# Patient Record
Sex: Male | Born: 1945
Health system: Southern US, Community
[De-identification: ages and names within clinical notes are randomized; demographics above are authoritative.]

## PROBLEM LIST (undated history)

## (undated) DIAGNOSIS — K5792 Diverticulitis of intestine, part unspecified, without perforation or abscess without bleeding: Secondary | ICD-10-CM

## (undated) DIAGNOSIS — D649 Anemia, unspecified: Secondary | ICD-10-CM

## (undated) DIAGNOSIS — F419 Anxiety disorder, unspecified: Secondary | ICD-10-CM

## (undated) DIAGNOSIS — J45909 Unspecified asthma, uncomplicated: Secondary | ICD-10-CM

## (undated) DIAGNOSIS — C61 Malignant neoplasm of prostate: Secondary | ICD-10-CM

## (undated) DIAGNOSIS — T7840XA Allergy, unspecified, initial encounter: Secondary | ICD-10-CM

## (undated) DIAGNOSIS — E785 Hyperlipidemia, unspecified: Secondary | ICD-10-CM

## (undated) DIAGNOSIS — R972 Elevated prostate specific antigen [PSA]: Secondary | ICD-10-CM

## (undated) DIAGNOSIS — K219 Gastro-esophageal reflux disease without esophagitis: Secondary | ICD-10-CM

## (undated) HISTORY — PX: OTHER SURGICAL HISTORY: SHX169

## (undated) HISTORY — DX: Anemia, unspecified: D64.9

## (undated) HISTORY — DX: Gastro-esophageal reflux disease without esophagitis: K21.9

## (undated) HISTORY — PX: VASECTOMY: SHX75

## (undated) HISTORY — DX: Hyperlipidemia, unspecified: E78.5

## (undated) HISTORY — PX: TRANSURETHRAL RESECTION OF PROSTATE: SHX73

## (undated) HISTORY — DX: Allergy, unspecified, initial encounter: T78.40XA

## (undated) HISTORY — DX: Anxiety disorder, unspecified: F41.9

## (undated) HISTORY — DX: Malignant neoplasm of prostate: C61

## (undated) HISTORY — DX: Unspecified asthma, uncomplicated: J45.909

## (undated) HISTORY — DX: Diverticulitis of intestine, part unspecified, without perforation or abscess without bleeding: K57.92

## (undated) HISTORY — DX: Elevated prostate specific antigen (PSA): R97.20

---

## 1996-04-22 DIAGNOSIS — J45909 Unspecified asthma, uncomplicated: Secondary | ICD-10-CM

## 1996-04-22 HISTORY — DX: Unspecified asthma, uncomplicated: J45.909

## 1997-04-22 HISTORY — PX: HERNIA REPAIR: SHX51

## 1997-09-02 ENCOUNTER — Ambulatory Visit (HOSPITAL_BASED_OUTPATIENT_CLINIC_OR_DEPARTMENT_OTHER): Admission: RE | Admit: 1997-09-02 | Discharge: 1997-09-02 | Payer: Self-pay | Admitting: General Surgery

## 1998-01-13 ENCOUNTER — Ambulatory Visit (HOSPITAL_COMMUNITY): Admission: RE | Admit: 1998-01-13 | Discharge: 1998-01-13 | Payer: Self-pay | Admitting: Gastroenterology

## 1998-09-21 ENCOUNTER — Emergency Department (HOSPITAL_COMMUNITY): Admission: EM | Admit: 1998-09-21 | Discharge: 1998-09-21 | Payer: Self-pay | Admitting: Emergency Medicine

## 1998-09-21 ENCOUNTER — Encounter: Payer: Self-pay | Admitting: Emergency Medicine

## 2001-05-16 ENCOUNTER — Ambulatory Visit (HOSPITAL_COMMUNITY): Admission: RE | Admit: 2001-05-16 | Discharge: 2001-05-16 | Payer: Self-pay | Admitting: Family Medicine

## 2001-05-16 ENCOUNTER — Encounter: Payer: Self-pay | Admitting: Family Medicine

## 2002-08-19 ENCOUNTER — Encounter: Payer: Self-pay | Admitting: Emergency Medicine

## 2002-08-19 ENCOUNTER — Emergency Department (HOSPITAL_COMMUNITY): Admission: EM | Admit: 2002-08-19 | Discharge: 2002-08-20 | Payer: Self-pay | Admitting: Emergency Medicine

## 2003-01-06 ENCOUNTER — Encounter: Payer: Self-pay | Admitting: Emergency Medicine

## 2003-01-06 ENCOUNTER — Emergency Department (HOSPITAL_COMMUNITY): Admission: EM | Admit: 2003-01-06 | Discharge: 2003-01-06 | Payer: Self-pay | Admitting: Emergency Medicine

## 2004-06-28 ENCOUNTER — Ambulatory Visit (HOSPITAL_COMMUNITY): Admission: RE | Admit: 2004-06-28 | Discharge: 2004-06-28 | Payer: Self-pay | Admitting: Family Medicine

## 2004-08-30 ENCOUNTER — Ambulatory Visit: Payer: Self-pay | Admitting: Internal Medicine

## 2007-03-26 ENCOUNTER — Ambulatory Visit: Payer: Self-pay | Admitting: Internal Medicine

## 2007-05-07 ENCOUNTER — Ambulatory Visit: Payer: Self-pay | Admitting: Internal Medicine

## 2007-05-07 ENCOUNTER — Encounter: Payer: Self-pay | Admitting: Internal Medicine

## 2010-10-12 ENCOUNTER — Ambulatory Visit (HOSPITAL_COMMUNITY)
Admission: RE | Admit: 2010-10-12 | Discharge: 2010-10-12 | Disposition: A | Discharge status: Home / Self Care | Payer: BC Managed Care – PPO | Source: Ambulatory Visit | Attending: Family Medicine | Admitting: Family Medicine

## 2010-10-12 ENCOUNTER — Other Ambulatory Visit: Payer: Self-pay | Admitting: Family Medicine

## 2010-10-12 DIAGNOSIS — R52 Pain, unspecified: Secondary | ICD-10-CM

## 2010-10-12 DIAGNOSIS — R937 Abnormal findings on diagnostic imaging of other parts of musculoskeletal system: Secondary | ICD-10-CM | POA: Insufficient documentation

## 2010-10-12 DIAGNOSIS — M25449 Effusion, unspecified hand: Secondary | ICD-10-CM | POA: Insufficient documentation

## 2010-10-12 DIAGNOSIS — M25549 Pain in joints of unspecified hand: Secondary | ICD-10-CM | POA: Insufficient documentation

## 2012-04-08 ENCOUNTER — Other Ambulatory Visit: Payer: Self-pay | Admitting: Family Medicine

## 2012-12-22 ENCOUNTER — Other Ambulatory Visit: Payer: Self-pay | Admitting: Family Medicine

## 2012-12-22 NOTE — Telephone Encounter (Signed)
1 refill needs followup office

## 2013-03-23 ENCOUNTER — Ambulatory Visit: Payer: Self-pay | Admitting: Radiation Oncology

## 2013-03-24 ENCOUNTER — Ambulatory Visit (INDEPENDENT_AMBULATORY_CARE_PROVIDER_SITE_OTHER): Payer: Medicare Other | Admitting: Family Medicine

## 2013-03-24 ENCOUNTER — Telehealth: Payer: Self-pay | Admitting: Family Medicine

## 2013-03-24 ENCOUNTER — Encounter: Payer: Self-pay | Admitting: Family Medicine

## 2013-03-24 ENCOUNTER — Other Ambulatory Visit: Payer: Self-pay | Admitting: Family Medicine

## 2013-03-24 ENCOUNTER — Other Ambulatory Visit: Payer: Self-pay | Admitting: *Deleted

## 2013-03-24 VITALS — BP 138/86 | Temp 97.8°F | Ht 68.0 in | Wt 189.8 lb

## 2013-03-24 DIAGNOSIS — J329 Chronic sinusitis, unspecified: Secondary | ICD-10-CM

## 2013-03-24 MED ORDER — ALPRAZOLAM 1 MG PO TABS: 1.0000 mg | ORAL_TABLET | Freq: Every evening | ORAL | Status: DC | PRN

## 2013-03-24 MED ORDER — AMOXICILLIN-POT CLAVULANATE 875-125 MG PO TABS: 1.0000 | ORAL_TABLET | Freq: Two times a day (BID) | ORAL | Status: AC

## 2013-03-24 NOTE — Telephone Encounter (Signed)
Ok plus two ref 

## 2013-03-24 NOTE — Telephone Encounter (Signed)
Not seen since Epic. 

## 2013-03-24 NOTE — Patient Instructions (Signed)
Purchase some debrox drops and every night for a week put a dropper of the medicine in your ears

## 2013-03-24 NOTE — Telephone Encounter (Signed)
Patient needs new prescription for alprazolam 1mg  called into [facility]

## 2013-03-24 NOTE — Telephone Encounter (Signed)
rx faxed  Pt notified

## 2013-03-24 NOTE — Progress Notes (Signed)
   Subjective:    Patient ID: Thomas [name], male    DOB: Oct 22, 1945, 67 y.o.   MRN: [id number]  Sinusitis This is a new problem. The current episode started 1 to 4 weeks ago. There has been no fever. Associated symptoms include congestion, ear pain, headaches and sinus pressure. Past treatments include saline sprays. The treatment provided mild relief.   Going on the past month Frontal headache  Loss of hearing left ear  Mod pain  Used ear wax cleaner  Used occasioal cold med, using spray for the nose    Review of Systems  HENT: Positive for congestion, ear pain and sinus pressure.   Neurological: Positive for headaches.   no chest pain no shortness breath ROS otherwise negative     Objective:   Physical Exam  Alert hydration good. Frontal tenderness. Pharynx slight erythema neck supple left ear packed with wax lungs clear. Heart regular in rhythm.      Assessment & Plan:  Impression rhinosinusitis #2 cerumen impaction plan Augmentin twice a day 10 days. Rationale discussed. Debrox drops to ear daily for the next week. Return for ear wax removal. WSL

## 2013-03-25 NOTE — Telephone Encounter (Signed)
Okay x3 if not already handled

## 2013-03-31 ENCOUNTER — Ambulatory Visit (INDEPENDENT_AMBULATORY_CARE_PROVIDER_SITE_OTHER): Payer: Medicare Other | Admitting: *Deleted

## 2013-03-31 ENCOUNTER — Other Ambulatory Visit: Payer: Self-pay | Admitting: Family Medicine

## 2013-03-31 ENCOUNTER — Other Ambulatory Visit: Payer: Self-pay | Admitting: *Deleted

## 2013-03-31 DIAGNOSIS — H6122 Impacted cerumen, left ear: Secondary | ICD-10-CM

## 2013-03-31 MED ORDER — ALPRAZOLAM 1 MG PO TABS: 1.0000 mg | ORAL_TABLET | Freq: Every evening | ORAL | Status: DC | PRN

## 2013-03-31 NOTE — Progress Notes (Signed)
   Subjective:    Patient ID: Thomas [name], male    DOB: Nov 30, 1945, 67 y.o.   [name]  HPI Ear irrigation on left ear. Patient tolerated well. Patient still has impacted ear wax on ear drum, Dr. Lorin Picket looked in his ears as well. We are going to refer him to an ENT.     Review of Systems     Objective:   Physical Exam        Assessment & Plan:

## 2013-04-07 ENCOUNTER — Ambulatory Visit: Payer: Self-pay | Admitting: Radiation Oncology

## 2013-04-22 ENCOUNTER — Ambulatory Visit: Payer: Self-pay | Admitting: Radiation Oncology

## 2013-05-13 ENCOUNTER — Other Ambulatory Visit: Payer: Self-pay | Admitting: *Deleted

## 2013-05-13 MED ORDER — ALPRAZOLAM 1 MG PO TABS: 1.0000 mg | ORAL_TABLET | Freq: Every evening | ORAL | Status: DC | PRN

## 2013-05-24 ENCOUNTER — Ambulatory Visit: Payer: Self-pay | Admitting: Radiation Oncology

## 2013-06-14 ENCOUNTER — Ambulatory Visit: Payer: Self-pay | Admitting: Urology

## 2013-06-14 LAB — CBC WITH DIFFERENTIAL/PLATELET
Basophil #: 0.1 10*3/uL (ref 0.0–0.1)
Basophil %: 1.4 %
EOS PCT: 7.7 %
Eosinophil #: 0.4 10*3/uL (ref 0.0–0.7)
HCT: 31.7 % — AB (ref 40.0–52.0)
HGB: 9.8 g/dL — ABNORMAL LOW (ref 13.0–18.0)
LYMPHS ABS: 1.5 10*3/uL (ref 1.0–3.6)
Lymphocyte %: 29.1 %
MCH: 22 pg — AB (ref 26.0–34.0)
MCHC: 30.8 g/dL — ABNORMAL LOW (ref 32.0–36.0)
MCV: 71 fL — ABNORMAL LOW (ref 80–100)
Monocyte #: 0.6 x10 3/mm (ref 0.2–1.0)
Monocyte %: 10.6 %
NEUTROS PCT: 51.2 %
Neutrophil #: 2.7 10*3/uL (ref 1.4–6.5)
PLATELETS: 282 10*3/uL (ref 150–440)
RBC: 4.45 10*6/uL (ref 4.40–5.90)
RDW: 17 % — ABNORMAL HIGH (ref 11.5–14.5)
WBC: 5.2 10*3/uL (ref 3.8–10.6)

## 2013-06-16 ENCOUNTER — Telehealth: Payer: Self-pay | Admitting: Family Medicine

## 2013-06-16 NOTE — Telephone Encounter (Signed)
Surgical clearence forms, see inside left side paper chart

## 2013-06-18 NOTE — Telephone Encounter (Signed)
I discussed the case with his wife. I believe this patient can undergo radiation seed implants. A letter was constructed and sent to preop surgical center at [facility]. It was recommended to the wife that the patient followup within 30 days to discuss the anemia and also cognitive function. She stated she will see to it that it occurs.

## 2013-06-20 ENCOUNTER — Ambulatory Visit: Payer: Self-pay | Admitting: Radiation Oncology

## 2013-06-21 ENCOUNTER — Ambulatory Visit: Payer: Self-pay | Admitting: Urology

## 2013-06-22 ENCOUNTER — Ambulatory Visit: Payer: Medicare HMO | Admitting: Family Medicine

## 2013-06-26 ENCOUNTER — Encounter: Payer: Self-pay | Admitting: *Deleted

## 2013-06-30 ENCOUNTER — Ambulatory Visit (INDEPENDENT_AMBULATORY_CARE_PROVIDER_SITE_OTHER): Payer: Medicare HMO | Admitting: Family Medicine

## 2013-06-30 ENCOUNTER — Encounter: Payer: Self-pay | Admitting: Family Medicine

## 2013-06-30 VITALS — BP 120/70 | Ht 68.0 in | Wt 192.1 lb

## 2013-06-30 DIAGNOSIS — D509 Iron deficiency anemia, unspecified: Secondary | ICD-10-CM

## 2013-06-30 DIAGNOSIS — R109 Unspecified abdominal pain: Secondary | ICD-10-CM

## 2013-06-30 DIAGNOSIS — R6889 Other general symptoms and signs: Secondary | ICD-10-CM

## 2013-06-30 LAB — HEPATIC FUNCTION PANEL
ALBUMIN: 3.8 g/dL (ref 3.5–5.2)
ALT: 35 U/L (ref 0–53)
AST: 29 U/L (ref 0–37)
Alkaline Phosphatase: 79 U/L (ref 39–117)
BILIRUBIN TOTAL: 0.2 mg/dL (ref 0.2–1.2)
Bilirubin, Direct: <0.1 mg/dL (ref 0.0–0.3)
TOTAL PROTEIN: 6.1 g/dL (ref 6.0–8.3)

## 2013-06-30 LAB — BASIC METABOLIC PANEL
BUN: 11 mg/dL (ref 6–23)
CALCIUM: 8.9 mg/dL (ref 8.4–10.5)
CHLORIDE: 105 meq/L (ref 96–112)
CO2: 27 mEq/L (ref 19–32)
CREATININE: 0.83 mg/dL (ref 0.50–1.35)
Glucose, Bld: 87 mg/dL (ref 70–99)
Potassium: 4.5 mEq/L (ref 3.5–5.3)
Sodium: 140 mEq/L (ref 135–145)

## 2013-06-30 LAB — IRON AND TIBC
%SAT: 4 % — ABNORMAL LOW (ref 20–55)
IRON: 17 ug/dL — AB (ref 42–165)
TIBC: 473 ug/dL — ABNORMAL HIGH (ref 215–435)
UIBC: 456 ug/dL — AB (ref 125–400)

## 2013-06-30 LAB — LIPASE: LIPASE: 26 U/L (ref 0–75)

## 2013-06-30 NOTE — Progress Notes (Signed)
Subjective:    Patient ID: Thomas [name], male    DOB: 1945/10/02, 68 y.o.   MRN: [id number]  Abdominal Pain This is a new problem. The current episode started 1 to 4 weeks ago. The onset quality is sudden. The problem occurs intermittently. The problem has been unchanged. The pain is located in the generalized abdominal region. The pain is moderate. The abdominal pain does not radiate. Pertinent negatives include no anorexia, diarrhea, headaches or nausea. Nothing aggravates the pain. The pain is relieved by nothing. He has tried nothing for the symptoms. The treatment provided no relief.  Patient is also here for a follow up visit after his prostate seeding procedure. Patient states he also has a low blood count.   comes and goes, doesn't wake with this at night.  Had anemia in preop-long discussion held regarding anemia what it could mean. Denies rectal bleeding Long discussion held regarding patient's forgetfulness. Wife raises it is more of a concern that the patient does.   Review of Systems  Constitutional: Negative for activity change, appetite change and fatigue.  HENT: Negative for congestion.   Respiratory: Negative for chest tightness and shortness of breath.   Cardiovascular: Negative for chest pain.  Gastrointestinal: Positive for abdominal pain and blood in stool. Negative for nausea, diarrhea and anorexia.  Endocrine: Negative for polydipsia and polyphagia.  Genitourinary: Negative for flank pain and difficulty urinating.  Neurological: Negative for dizziness, weakness and headaches.  Psychiatric/Behavioral: Negative for confusion.       Objective:   Physical Exam  Constitutional: He appears well-developed and well-nourished.  HENT:  Head: Normocephalic and atraumatic.  Right Ear: External ear normal.  Left Ear: External ear normal.  Nose: Nose normal.  Mouth/Throat: Oropharynx is clear and moist.  Eyes: EOM are normal.  Neck: Neck supple. No thyromegaly  present.  Cardiovascular: Normal rate, regular rhythm and normal heart sounds.   No murmur heard. Pulmonary/Chest: Effort normal and breath sounds normal. No respiratory distress. He has no wheezes.  Abdominal: Soft. Bowel sounds are normal. He exhibits no distension and no mass. There is no tenderness.  Genitourinary: Penis normal.  Musculoskeletal: Normal range of motion. He exhibits no edema.  Lymphadenopathy:    He has no cervical adenopathy.  Neurological: He is alert. He exhibits normal muscle tone.  Skin: Skin is warm and dry. No erythema.  Psychiatric: He has a normal mood and affect. His behavior is normal. Judgment normal.    MMSE completed.      Assessment & Plan:  Anemia- will need labs possibility of deficient anemia. I believe that this patient will probably end up needing to have a scope test. His last one in 2009. Is very important for him to go ahead with getting these lab tests done this was expressed to the patient he was told that there could be numerous problems including colon cancer going on.  abd pain - start PPI, hopefully this will improve him over the course of next 2 weeks. If it does not he will need further testing done. He is going to be seen oncologist regarding his prostate cancer. I wonder if the patient is going to be doing a PET scan. The wife will give me the phone number for the oncologist and we will try to communicate with them regarding this.  Forgetfulness-the wife raises it is concerned that he has a tendency toward forgetfulness and forgetting short-term memory aspects but the patient does 3 for 3 immediate and distracted recall.  He does successful clock drawing. He scores 30 out of 30 with the mini mental status exam so therefore we will continue currently as is.  He should followup again with [place] in several months time

## 2013-07-01 LAB — FERRITIN: Ferritin: 4 ng/mL — ABNORMAL LOW (ref 22–322)

## 2013-07-05 ENCOUNTER — Other Ambulatory Visit: Payer: Self-pay | Admitting: Family Medicine

## 2013-07-05 ENCOUNTER — Telehealth: Payer: Self-pay | Admitting: Family Medicine

## 2013-07-05 DIAGNOSIS — D509 Iron deficiency anemia, unspecified: Secondary | ICD-10-CM

## 2013-07-05 NOTE — Telephone Encounter (Signed)
Significant iron deficiency is noted. This means looking into. I recommend that we set him back up with gastroenterology because I believe they will need to do a scope test to help identify why he is so deficient in iron. He has seen Dr. [name] before in the past. Is he fine with me giving him an appointment with her?

## 2013-07-05 NOTE — Telephone Encounter (Signed)
Referral was made please see previous telephone message. Referral is now in the system. Thank you

## 2013-07-05 NOTE — Telephone Encounter (Signed)
Calling to get results to patients blood work

## 2013-07-05 NOTE — Telephone Encounter (Signed)
Results discussed with patient. Patient wants to see Dr. [name]. Patient prefers Monday March 31 after 12 or Tuesday March 31 all day or April 1 all day

## 2013-07-19 ENCOUNTER — Encounter: Payer: Self-pay | Admitting: Gastroenterology

## 2013-07-19 ENCOUNTER — Ambulatory Visit (INDEPENDENT_AMBULATORY_CARE_PROVIDER_SITE_OTHER): Payer: Medicare HMO | Admitting: Gastroenterology

## 2013-07-19 VITALS — BP 118/68 | HR 64 | Ht 68.0 in | Wt 189.0 lb

## 2013-07-19 DIAGNOSIS — E611 Iron deficiency: Secondary | ICD-10-CM | POA: Insufficient documentation

## 2013-07-19 DIAGNOSIS — D509 Iron deficiency anemia, unspecified: Secondary | ICD-10-CM

## 2013-07-19 DIAGNOSIS — K625 Hemorrhage of anus and rectum: Secondary | ICD-10-CM | POA: Insufficient documentation

## 2013-07-19 DIAGNOSIS — R1012 Left upper quadrant pain: Secondary | ICD-10-CM | POA: Insufficient documentation

## 2013-07-19 DIAGNOSIS — D649 Anemia, unspecified: Secondary | ICD-10-CM

## 2013-07-19 MED ORDER — MOVIPREP 100 G PO SOLR: 1.0000 | Freq: Once | ORAL | Status: DC

## 2013-07-19 MED ORDER — FERROUS SULFATE 325 (65 FE) MG PO TABS: 325.0000 mg | ORAL_TABLET | Freq: Every day | ORAL | Status: DC

## 2013-07-19 NOTE — Progress Notes (Signed)
07/19/2013 Thomas [name] [id number] 1945/10/13   HISTORY OF PRESENT ILLNESS:  This is a pleasant 68 year old male who presents to our office today at the request of his PCP, Dr. [name].  He was sent here today for evaluation regarding upper abdominal pain and iron deficiency.  The patient states that he really started noticing this pain in his left epigastrium/LUQ approximately 3 weeks ago.  He says that he thinks that it was present very mildly before that as well, but definitely worsened about 3 weeks ago.  He says that it was going through to his back.  He was previously taking NSAID's, but when he read about ulcers he stopped taking them.  He is taking Nexium 40 mg daily and has been taking that for several years, but had tried to decrease the use before this pain began; since the pain has bee present he has been taking it every day again.  He says that he got some OTC gas-x, which may have helped some.  The pain is improved now, but is still present.  Lipase was normal.  He was found to have low iron levels with ferritin of 4, iron level of 17, and % saturation at 4.  No CBC was drawn, however.  He was not placed on iron supplements by his PCP, however.    His last colonoscopy was in 04/2007 by Dr. [name], however, I cannot access the report at this time.  He had a "polyp" removed but pathology did not show any changes, only colorectal mucosa.  Never had EGD.  He says that he had been having some rectal bleeding (bright red blood) for a while, but his PCP thought it was likely from a hemorrhoid.  He denies any weight loss.   Past Medical History  Diagnosis Date  . Diverticulitis   . Asthma 1998  . GERD (gastroesophageal reflux disease)   . Hyperlipidemia   . Elevated PSA    Past Surgical History  Procedure Laterality Date  . Vasectomy    . Hernia repair  1999    reports that he has never smoked. He does not have any smokeless tobacco history on file. His alcohol and drug histories  are not on file. family history includes Diabetes in his brother; Heart disease in his father; Hypertension in his brother. Allergies  Allergen Reactions  . Ciprofloxacin       Outpatient Encounter Prescriptions as of 07/19/2013  Medication Sig  . ALPRAZolam (XANAX) 1 MG tablet Take 1 tablet (1 mg total) by mouth at bedtime as needed for anxiety.  . cetirizine (ZYRTEC) 10 MG tablet Take 10 mg by mouth daily.  Marland Kitchen esomeprazole (NEXIUM) 40 MG capsule Take 40 mg by mouth daily at 12 noon.     REVIEW OF SYSTEMS  : All other systems reviewed and negative except where noted in the History of Present Illness.   PHYSICAL EXAM: BP 118/68  Pulse 64  Ht 5\' 8"  (1.727 m)  Wt 189 lb (85.73 kg)  BMI 28.74 kg/m2 General: Well developed white male in no acute distress Head: Normocephalic and atraumatic Eyes:  Sclerae anicteric, conjunctiva pink. Ears: Normal auditory acuity Lungs: Clear throughout to auscultation Heart: Regular rate and rhythm Abdomen: Soft, non-distended.  Normal bowel sounds.  Non-tender. Rectal:  Deferred.  Will be done at the time of colonoscopy.   Musculoskeletal: Symmetrical with no gross deformities  Skin: No lesions on visible extremities Extremities: No edema  Neurological: Alert oriented x 4, grossly non-focal  Psychological:  Alert and cooperative. Normal mood and affect  ASSESSMENT AND PLAN: -Iron deficiency:  Iron levels low, but no Hgb drawn.  Will check CBC.  Will need to start ferrous sulfate 325 mg daily for now.   -LUQ abdominal pain:  ? Ulcer.  Will increase Nexium to 40 mg BID for now (samples given).  Was previously taking NSAID's, but has discontinued those.  -Rectal bleeding:  Thought to be secondary to hemorrhoids.  Last colonoscopy was 6 years ago.  *Will schedule EGD and colonoscopy for further evaluation.  The risks, benefits, and alternatives were discussed with the patient and he consents to proceed.

## 2013-07-19 NOTE — Patient Instructions (Signed)
You have been scheduled for an endoscopy and colonoscopy with propofol. Please follow the written instructions given to you at your visit today. Please pick up your prep at the pharmacy within the next 1-3 days. If you use inhalers (even only as needed), please bring them with you on the day of your procedure. Your physician has requested that you go to www.startemmi.com and enter the access code given to you at your visit today. This web site gives a general overview about your procedure. However, you should still follow specific instructions given to you by our office regarding your preparation for the procedure. Increase your nexium to twice daily samples have been given. Your physician has requested that you go to the basement for the following lab work this week.  CBC We have sent the following medications to your pharmacy for you to pick up at your convenience:  Ferrous Sulfate (iron) CC:  Sallee Lange MD

## 2013-07-19 NOTE — Progress Notes (Signed)
Reviewed and agree.

## 2013-07-21 ENCOUNTER — Ambulatory Visit: Payer: Self-pay | Admitting: Radiation Oncology

## 2013-07-21 ENCOUNTER — Other Ambulatory Visit (INDEPENDENT_AMBULATORY_CARE_PROVIDER_SITE_OTHER): Payer: Commercial Managed Care - HMO

## 2013-07-21 DIAGNOSIS — D649 Anemia, unspecified: Secondary | ICD-10-CM

## 2013-07-21 LAB — CBC WITH DIFFERENTIAL/PLATELET
BASOS PCT: 0.2 % (ref 0.0–3.0)
Basophils Absolute: 0 10*3/uL (ref 0.0–0.1)
EOS PCT: 8.1 % — AB (ref 0.0–5.0)
Eosinophils Absolute: 0.6 10*3/uL (ref 0.0–0.7)
HCT: 29.6 % — ABNORMAL LOW (ref 39.0–52.0)
HEMOGLOBIN: 9.4 g/dL — AB (ref 13.0–17.0)
LYMPHS PCT: 15.7 % (ref 12.0–46.0)
Lymphs Abs: 1.3 10*3/uL (ref 0.7–4.0)
MCHC: 31.8 g/dL (ref 30.0–36.0)
MCV: 69.7 fl — ABNORMAL LOW (ref 78.0–100.0)
MONOS PCT: 10.9 % (ref 3.0–12.0)
Monocytes Absolute: 0.9 10*3/uL (ref 0.1–1.0)
NEUTROS ABS: 5.2 10*3/uL (ref 1.4–7.7)
Neutrophils Relative %: 65.1 % (ref 43.0–77.0)
Platelets: 246 10*3/uL (ref 150.0–400.0)
RBC: 4.24 Mil/uL (ref 4.22–5.81)
RDW: 16.7 % — ABNORMAL HIGH (ref 11.5–14.6)
WBC: 8 10*3/uL (ref 4.5–10.5)

## 2013-08-20 ENCOUNTER — Ambulatory Visit: Payer: Self-pay | Admitting: Radiation Oncology

## 2013-08-25 ENCOUNTER — Encounter: Payer: Self-pay | Admitting: Internal Medicine

## 2013-08-25 ENCOUNTER — Ambulatory Visit (AMBULATORY_SURGERY_CENTER): Payer: Commercial Managed Care - HMO | Admitting: Internal Medicine

## 2013-08-25 VITALS — BP 132/81 | HR 84 | Temp 96.1°F | Resp 21 | Ht 68.0 in | Wt 189.0 lb

## 2013-08-25 DIAGNOSIS — K2990 Gastroduodenitis, unspecified, without bleeding: Secondary | ICD-10-CM

## 2013-08-25 DIAGNOSIS — K625 Hemorrhage of anus and rectum: Secondary | ICD-10-CM

## 2013-08-25 DIAGNOSIS — K2970 Gastritis, unspecified, without bleeding: Secondary | ICD-10-CM

## 2013-08-25 DIAGNOSIS — D509 Iron deficiency anemia, unspecified: Secondary | ICD-10-CM

## 2013-08-25 DIAGNOSIS — D649 Anemia, unspecified: Secondary | ICD-10-CM

## 2013-08-25 DIAGNOSIS — K2940 Chronic atrophic gastritis without bleeding: Secondary | ICD-10-CM

## 2013-08-25 DIAGNOSIS — K209 Esophagitis, unspecified without bleeding: Secondary | ICD-10-CM

## 2013-08-25 DIAGNOSIS — E611 Iron deficiency: Secondary | ICD-10-CM

## 2013-08-25 DIAGNOSIS — K222 Esophageal obstruction: Secondary | ICD-10-CM

## 2013-08-25 MED ORDER — SODIUM CHLORIDE 0.9 % IV SOLN: 500.0000 mL | INTRAVENOUS | Status: DC

## 2013-08-25 NOTE — Op Note (Signed)
[facility]  [address] [place], [id number]   ENDOSCOPY PROCEDURE REPORT  PATIENT: Thomas [name], Thomas [name]  MR#: [id number] BIRTHDATE: [birth year] , 67  yrs. old GENDER: Male ENDOSCOPIST: Lafayette Dragon, MD REFERRED BY:  Sallee Lange, M.D. PROCEDURE DATE:  08/25/2013 PROCEDURE:  EGD w/ biopsy and Savary dilation of esophagus ASA CLASS:     Class II INDICATIONS:  Iron deficiency anemia.   left upper quadrant abdominal pain.  Using anti-inflammatory agents.  Pain has resolved when the Nexium increase to twice a day dose.  Iron saturation 4%.  MEDICATIONS: MAC sedation, administered by CRNA and Propofol (Diprivan) 180 mg IV TOPICAL ANESTHETIC: none  DESCRIPTION OF PROCEDURE: After the risks benefits and alternatives of the procedure were thoroughly explained, informed consent was obtained.  The LB [id number] endoscope was introduced through the mouth and advanced to the second portion of the duodenum. Without limitations.  The instrument was slowly withdrawn as the mucosa was fully examined.      esophagus: Proximal and mid esophageal mucosa appeared normal. There was a mildly eccentric benign appearing esophageal stricture at 352cm from the incisors[  ,it allowed the endoscope to traverse into the stomach but there was small amount of bleeding from the stricture. The diameter of the stricture was about 14 mm. There was a moderate size hiatal hernia distal to the stricture which extended from 32-35 cm from the incisors. There were no Cameron erosions. Biopsies were taken from the stricture to rule out Barrett's esophagus Stomach: Gastric mucosa was normal. There was mild erythema in gastric antrum. Biopsies were taken to rule out H. pylori. Gastric outlet was normal. Retroflexion of the endoscope confirmed the presence of a hiatal hernia Duodenum: Duodenal bulb and descending duodenum was normal Savary dilators so were used to dilate the esophageal  stricture starting with the 15 mm and subsequently a 16 mm dilator there was blood on the last dilator       The scope was then withdrawn from the patient and the procedure completed.  COMPLICATIONS: There were no complications. ENDOSCOPIC IMPRESSION: benign appearing distal esophageal stricture. Status post dilatation to 16 mm Esophagitis. Status post biopsies from GE junction 3 cm nonreducible hiatal hernia Gastric biopsies to rule out H. pylori RECOMMENDATIONS: 1.  Await pathology results 2.  Anti-reflux regimen to be follow 3.  May reduce Nexium to 1 and a Avoid anti-inflammatory agents Proceed with colonoscopy  REPEAT EXAM: for EGD pending biopsy results.  eSigned:  Lafayette Dragon, MD 08/25/2013 3:08 PM   CC:  PATIENT NAME:  Thomas [name], Thomas [name] MR#: [id number]

## 2013-08-25 NOTE — Op Note (Signed)
[facility]  [address]. [place], [address]   COLONOSCOPY PROCEDURE REPORT  PATIENT: [name], Thomas  MR#: [id number] BIRTHDATE: 1945/11/23 , 67  yrs. old GENDER: Male ENDOSCOPIST: Lafayette Dragon, MD REFERRED XL:KGMWN [name], M.D. PROCEDURE DATE:  08/25/2013 PROCEDURE:   Colonoscopy, screening First Screening Colonoscopy - Avg.  risk and is 50 yrs.  old or older - No.  Prior Negative Screening - Now for repeat screening. N/A  History of Adenoma - Now for follow-up colonoscopy & has been > or = to 3 yrs.  N/A  Polyps Removed Today? No.  Recommend repeat exam, <10 yrs? No. ASA CLASS:   Class II INDICATIONS:Iron Deficiency Anemia, Average risk patient for colon cancer, and llast colonoscopy 2009 patient told to have polyps. Report not available, now with iron saturation of 4%. MEDICATIONS: MAC sedation, administered by CRNA and Propofol (Diprivan) 110 mg IV  DESCRIPTION OF PROCEDURE:   After the risks benefits and alternatives of the procedure were thoroughly explained, informed consent was obtained.  A digital rectal exam revealed no abnormalities of the rectum.   The LB [id number]  endoscope was introduced through the anus and advanced to the cecum, which was identified by both the appendix and ileocecal valve. No adverse events experienced.   The quality of the prep was good, using MoviPrep  The instrument was then slowly withdrawn as the colon was fully examined.      COLON FINDINGS: A normal appearing cecum, ileocecal valve, and appendiceal orifice were identified.  The ascending, hepatic flexure, transverse, splenic flexure, descending, sigmoid colon and rectum appeared unremarkable.  No polyps or cancers were seen. Retroflexed views revealed no abnormalities. The time to cecum=4 minutes 40 seconds.  Withdrawal time=6 minutes 37 seconds.  The scope was withdrawn and the procedure completed. COMPLICATIONS: There were no  complications.  ENDOSCOPIC IMPRESSION: Normal colon small internal hemorrhoids,  RECOMMENDATIONS: high fiber diet Recall colonoscopy in 10 years, nothing to explain iron deficiency anemia I think iron deficiency anemia is due to upper GI blood loss from esophageal stricture and gastroesophageal reflux. She was much improved on double dose Nexium. He has a moderate size hiatal hernia and needs to follow strict dietary reflux measures. He will also avoid excessive use of anti-inflammatory agents   eSigned:  Lafayette Dragon, MD 08/25/2013 3:14 PM   cc:   PATIENT NAME:  Thomas, [name] MR#: [id number]

## 2013-08-25 NOTE — Progress Notes (Signed)
Called to room to assist during endoscopic procedure.  Patient ID and intended procedure confirmed with present staff. Received instructions for my participation in the procedure from the performing physician.  

## 2013-08-25 NOTE — Progress Notes (Signed)
A/ox3 pleased with MAC, report to April RN 

## 2013-08-25 NOTE — Patient Instructions (Signed)
YOU HAD AN ENDOSCOPIC PROCEDURE TODAY AT THE [facility]: Refer to the procedure report that was given to you for any specific questions about what was found during the examination.  If the procedure report does not answer your questions, please call your gastroenterologist to clarify.  If you requested that your care partner not be given the details of your procedure findings, then the procedure report has been included in a sealed envelope for you to review at your convenience later.  YOU SHOULD EXPECT: Some feelings of bloating in the abdomen. Passage of more gas than usual.  Walking can help get rid of the air that was put into your GI tract during the procedure and reduce the bloating. If you had a lower endoscopy (such as a colonoscopy or flexible sigmoidoscopy) you may notice spotting of blood in your stool or on the toilet paper. If you underwent a bowel prep for your procedure, then you may not have a normal bowel movement for a few days.  DIET: Your first meal following the procedure should be a light meal and then it is ok to progress to your normal diet.  A half-sandwich or bowl of soup is an example of a good first meal.  Heavy or fried foods are harder to digest and may make you feel nauseous or bloated.  Likewise meals heavy in dairy and vegetables can cause extra gas to form and this can also increase the bloating.  Drink plenty of fluids but you should avoid alcoholic beverages for 24 hours.  ACTIVITY: Your care partner should take you home directly after the procedure.  You should plan to take it easy, moving slowly for the rest of the day.  You can resume normal activity the day after the procedure however you should NOT DRIVE or use heavy machinery for 24 hours (because of the sedation medicines used during the test).    SYMPTOMS TO REPORT IMMEDIATELY: A gastroenterologist can be reached at any hour.  During normal business hours, 8:30 AM to 5:00 PM Monday through Friday,  call [phone].  After hours and on weekends, please call the GI answering service at [phone] who will take a message and have the physician on call contact you.   Following lower endoscopy (colonoscopy or flexible sigmoidoscopy):  Excessive amounts of blood in the stool  Significant tenderness or worsening of abdominal pains  Swelling of the abdomen that is new, acute  Fever of 100F or higher  Following upper endoscopy (EGD)  Vomiting of blood or coffee ground material  New chest pain or pain under the shoulder blades  Painful or persistently difficult swallowing  New shortness of breath  Fever of 100F or higher  Black, tarry-looking stools  FOLLOW UP: If any biopsies were taken you will be contacted by phone or by letter within the next 1-3 weeks.  Call your gastroenterologist if you have not heard about the biopsies in 3 weeks.  Our staff will call the home number listed on your records the next business day following your procedure to check on you and address any questions or concerns that you may have at that time regarding the information given to you following your procedure. This is a courtesy call and so if there is no answer at the home number and we have not heard from you through the emergency physician on call, we will assume that you have returned to your regular daily activities without incident.  SIGNATURES/CONFIDENTIALITY: You and/or your care   partner have signed paperwork which will be entered into your electronic medical record.  These signatures attest to the fact that that the information above on your After Visit Summary has been reviewed and is understood.  Full responsibility of the confidentiality of this discharge information lies with you and/or your care-partner.  Hemorrhoids, stricture, hiatal hernia, gatritis, esophagitis-handouts given  Anti-reflux regimen  Avoid anti-inflammatory medications-advil, motrin, aleve, naproxen  Reduce Nexium to 1  a day.

## 2013-08-26 ENCOUNTER — Telehealth: Payer: Self-pay

## 2013-08-26 NOTE — Telephone Encounter (Signed)
  Follow up Call-  Call back number 08/25/2013  Post procedure Call Back phone  #  cell [phone]  Permission to leave phone message Yes     Patient questions:  Do you have a fever, pain , or abdominal swelling? no Pain Score  0 *  Have you tolerated food without any problems? yes  Have you been able to return to your normal activities? yes  Do you have any questions about your discharge instructions: Diet   no Medications  no Follow up visit  no  Do you have questions or concerns about your Care? no  Actions: * If pain score is 4 or above: No action needed, pain <4.  Pt was not avalable.  I spoke with is wife and she said "he did fine".  I asked her to let him know we called.  To return our call if any questions or concerns. maw

## 2013-08-30 ENCOUNTER — Encounter: Payer: Self-pay | Admitting: Internal Medicine

## 2013-11-12 ENCOUNTER — Ambulatory Visit: Payer: Self-pay | Admitting: Radiation Oncology

## 2013-11-15 ENCOUNTER — Telehealth: Payer: Self-pay | Admitting: Family Medicine

## 2013-11-15 NOTE — Telephone Encounter (Signed)
[employer] referral # [id number], good for 6 visits, expires 05/18/2014 - left this information on Louann's voicemail, pt is existing with Dr. Baruch Gouty

## 2013-11-16 LAB — PSA: PSA: 0.4 ng/mL (ref 0.0–4.0)

## 2013-11-20 ENCOUNTER — Ambulatory Visit: Payer: Self-pay | Admitting: Radiation Oncology

## 2013-11-29 ENCOUNTER — Ambulatory Visit (INDEPENDENT_AMBULATORY_CARE_PROVIDER_SITE_OTHER): Payer: Medicare HMO | Admitting: Internal Medicine

## 2013-11-29 DIAGNOSIS — Z0289 Encounter for other administrative examinations: Secondary | ICD-10-CM

## 2013-12-30 ENCOUNTER — Other Ambulatory Visit: Payer: Self-pay | Admitting: *Deleted

## 2013-12-30 ENCOUNTER — Telehealth: Payer: Self-pay | Admitting: Family Medicine

## 2013-12-30 NOTE — Telephone Encounter (Signed)
Last seen 06/30/13

## 2013-12-30 NOTE — Telephone Encounter (Signed)
Patient needs Rx for alprazolam to [facility].

## 2013-12-30 NOTE — Telephone Encounter (Signed)
He may have 2 refills but he needs to followup by later this year

## 2013-12-30 NOTE — Telephone Encounter (Signed)
Directions for alprzolam are one qhs prn anxiety and #90was sent in last time. Do you want to give him a 90 day supply this time.or 30 day with 2 refills.

## 2013-12-31 ENCOUNTER — Other Ambulatory Visit: Payer: Self-pay | Admitting: *Deleted

## 2013-12-31 MED ORDER — ALPRAZOLAM 1 MG PO TABS: 1.0000 mg | ORAL_TABLET | Freq: Every evening | ORAL | Status: DC | PRN

## 2013-12-31 NOTE — Telephone Encounter (Signed)
Rx sent electronically to pharmacy. Patient notified. 

## 2013-12-31 NOTE — Telephone Encounter (Signed)
He could have 90 tablets one each bedtime but no refills he needs office visit this fall

## 2014-02-07 ENCOUNTER — Ambulatory Visit: Payer: Medicare HMO | Admitting: Family Medicine

## 2014-02-17 ENCOUNTER — Ambulatory Visit: Payer: Medicare HMO | Admitting: Family Medicine

## 2014-02-22 ENCOUNTER — Ambulatory Visit: Payer: Medicare HMO | Admitting: Family Medicine

## 2014-03-09 ENCOUNTER — Ambulatory Visit (INDEPENDENT_AMBULATORY_CARE_PROVIDER_SITE_OTHER): Payer: Medicare HMO | Admitting: Family Medicine

## 2014-03-09 ENCOUNTER — Encounter: Payer: Self-pay | Admitting: Family Medicine

## 2014-03-09 VITALS — BP 126/86 | Ht 65.0 in | Wt 183.0 lb

## 2014-03-09 DIAGNOSIS — R6889 Other general symptoms and signs: Secondary | ICD-10-CM

## 2014-03-09 DIAGNOSIS — D509 Iron deficiency anemia, unspecified: Secondary | ICD-10-CM

## 2014-03-09 DIAGNOSIS — Z23 Encounter for immunization: Secondary | ICD-10-CM

## 2014-03-09 DIAGNOSIS — Z79899 Other long term (current) drug therapy: Secondary | ICD-10-CM

## 2014-03-09 DIAGNOSIS — R197 Diarrhea, unspecified: Secondary | ICD-10-CM

## 2014-03-09 DIAGNOSIS — I159 Secondary hypertension, unspecified: Secondary | ICD-10-CM

## 2014-03-09 LAB — CBC WITH DIFFERENTIAL/PLATELET
BASOS ABS: 0 10*3/uL (ref 0.0–0.1)
BASOS PCT: 0 % (ref 0–1)
Eosinophils Absolute: 0.3 10*3/uL (ref 0.0–0.7)
Eosinophils Relative: 5 % (ref 0–5)
HCT: 43.2 % (ref 39.0–52.0)
Hemoglobin: 14.7 g/dL (ref 13.0–17.0)
LYMPHS PCT: 21 % (ref 12–46)
Lymphs Abs: 1.1 10*3/uL (ref 0.7–4.0)
MCH: 30.3 pg (ref 26.0–34.0)
MCHC: 34 g/dL (ref 30.0–36.0)
MCV: 89.1 fL (ref 78.0–100.0)
MONO ABS: 0.6 10*3/uL (ref 0.1–1.0)
MPV: 11.7 fL (ref 9.4–12.4)
Monocytes Relative: 11 % (ref 3–12)
Neutro Abs: 3.3 10*3/uL (ref 1.7–7.7)
Neutrophils Relative %: 63 % (ref 43–77)
PLATELETS: 213 10*3/uL (ref 150–400)
RBC: 4.85 MIL/uL (ref 4.22–5.81)
RDW: 14 % (ref 11.5–15.5)
WBC: 5.2 10*3/uL (ref 4.0–10.5)

## 2014-03-09 LAB — BASIC METABOLIC PANEL
BUN: 9 mg/dL (ref 6–23)
CHLORIDE: 104 meq/L (ref 96–112)
CO2: 30 mEq/L (ref 19–32)
CREATININE: 0.83 mg/dL (ref 0.50–1.35)
Calcium: 9.3 mg/dL (ref 8.4–10.5)
Glucose, Bld: 85 mg/dL (ref 70–99)
Potassium: 4.6 mEq/L (ref 3.5–5.3)
Sodium: 139 mEq/L (ref 135–145)

## 2014-03-09 LAB — HEPATIC FUNCTION PANEL
ALT: 16 U/L (ref 0–53)
AST: 16 U/L (ref 0–37)
Albumin: 4.1 g/dL (ref 3.5–5.2)
Alkaline Phosphatase: 79 U/L (ref 39–117)
BILIRUBIN DIRECT: 0.1 mg/dL (ref 0.0–0.3)
Indirect Bilirubin: 0.2 mg/dL (ref 0.2–1.2)
Total Bilirubin: 0.3 mg/dL (ref 0.2–1.2)
Total Protein: 6.7 g/dL (ref 6.0–8.3)

## 2014-03-09 LAB — FERRITIN: Ferritin: 20 ng/mL — ABNORMAL LOW (ref 22–322)

## 2014-03-09 MED ORDER — ALPRAZOLAM 1 MG PO TABS: 1.0000 mg | ORAL_TABLET | Freq: Every evening | ORAL | Status: DC | PRN

## 2014-03-09 NOTE — Progress Notes (Signed)
   Subjective:    Patient ID: Thomas [name], male    DOB: 1946/01/04, 68 y.o.   MRN: [id number]  HPI Patient is here today for itching and loose stools.  The itching is worst at night. It wakes him up from his sleep. The itching is all over his body. He will take a Benadryl and the itching will go away.  Also having loose stools. Had a colonoscopy. He will drink Pepto Bismol and the loose stools will stop. He does eat a bowl of ice cream every night, may be lactose intolerant?   Pt passed the mini cog.   Review of Systems Denies weight loss nausea vomiting relates some diarrhea denies cough wheezing or chest pain    Objective:   Physical Exam Neck no masses lungs clear no crackles heart is regular pulse normal blood pressure good abdomen soft extremities no edema skin warm dry       Assessment & Plan:  Patient with intermittent diarrhea will do lab work to rule out celiac disease. Await the results  Itching in the skin will look at liver profile kidney profile recommend lotion recommend also using humidifier  Forgetfulness-patient passed mini cog  Patient does have some issues with sleep renewal of medication given patient does not want to go on antidepressant. States at times feels a little sad but denies being depressed his wife states he gets irritable at times he states he will work on that  History of anemia check CBC ferritin  Patient refuses flu shot. Pneumonia vaccine given today.

## 2014-03-10 LAB — TISSUE TRANSGLUTAMINASE, IGA: Tissue Transglutaminase Ab, IgA: 10.5 U/mL (ref <20)

## 2014-03-15 ENCOUNTER — Ambulatory Visit: Payer: Self-pay | Admitting: Radiation Oncology

## 2014-03-15 ENCOUNTER — Telehealth: Payer: Self-pay | Admitting: Family Medicine

## 2014-03-15 NOTE — Telephone Encounter (Signed)
Pt being seen by [facility] 11/25 at 10 am, we need to send over a  [facility] Referral for this pt  Diag code : Dannielle Huh Dr Lisbeth Renshaw  NPI [id number]  Please to this as soon as possible

## 2014-03-29 ENCOUNTER — Ambulatory Visit: Payer: Self-pay | Admitting: Radiation Oncology

## 2014-04-01 LAB — PSA: PSA: 1 ng/mL (ref 0.0–4.0)

## 2014-04-22 ENCOUNTER — Ambulatory Visit: Payer: Self-pay | Admitting: Radiation Oncology

## 2014-05-05 ENCOUNTER — Ambulatory Visit (INDEPENDENT_AMBULATORY_CARE_PROVIDER_SITE_OTHER): Payer: Commercial Managed Care - HMO | Admitting: Family Medicine

## 2014-05-05 ENCOUNTER — Encounter: Payer: Self-pay | Admitting: Family Medicine

## 2014-05-05 VITALS — BP 110/70 | Temp 97.8°F | Ht 65.0 in | Wt 184.0 lb

## 2014-05-05 DIAGNOSIS — M545 Low back pain, unspecified: Secondary | ICD-10-CM

## 2014-05-05 DIAGNOSIS — R3989 Other symptoms and signs involving the genitourinary system: Secondary | ICD-10-CM

## 2014-05-05 DIAGNOSIS — J208 Acute bronchitis due to other specified organisms: Secondary | ICD-10-CM

## 2014-05-05 LAB — POCT URINALYSIS DIPSTICK
PH UA: 6
Spec Grav, UA: 1.015

## 2014-05-05 MED ORDER — AZITHROMYCIN 250 MG PO TABS: ORAL_TABLET | ORAL | Status: DC

## 2014-05-05 NOTE — Progress Notes (Signed)
   Subjective:    Patient ID: Thomas [name], male    DOB: 04-Jan-1946, 69 y.o.   MRN: [id number]  HPI Patient is here today d/t low back pain on his left side.  He has had a chest cold for 3 weeks now. He does not know if it is related to the cough or his prostate. Patient relates chest cold for the past 3 weeks coughing congestion started hurting in his side yesterday hurts more when he coughs denies sweats chills nausea vomiting No urinary s/s. No pelvic pain.     Review of Systems     Objective:   Physical Exam  Lungs are clear hearts regular course cough noted consistent with bronchitis eardrums normal neck no masses      Assessment & Plan:  Bronchitis antibiotics prescribed warning signs discussed follow-up if ongoing troubles  The lower back pain I think is related to the coughing this should get better if it does not he will let us know I find no evidence of shingles no evidence of UTI

## 2014-05-11 ENCOUNTER — Telehealth: Payer: Self-pay | Admitting: Family Medicine

## 2014-05-11 MED ORDER — AMOXICILLIN-POT CLAVULANATE 875-125 MG PO TABS: 1.0000 | ORAL_TABLET | Freq: Two times a day (BID) | ORAL | Status: DC

## 2014-05-11 NOTE — Telephone Encounter (Signed)
Patient just has a cough with mucous. Was told that he can get another refill if his cough was not gone. He said he feels a lot better. No fever. No SOB/wheezing

## 2014-05-11 NOTE — Telephone Encounter (Signed)
Patient notified and verbalized understanding. 

## 2014-05-11 NOTE — Telephone Encounter (Signed)
I would rec a differnet round of meds, Augmentin 875 one bid 10 days with food ( let pt know this combination is strong and should help clear it ) if ongoing follow up

## 2014-05-11 NOTE — Telephone Encounter (Signed)
Patient seen on 05/05/14 and diagnosed with bronchitis.  He is still having cough, phlegm in throat.  He wants to know if he can get a refill on his antibiotic.  [facility].

## 2014-08-12 NOTE — Consult Note (Signed)
Reason for Visit: This 69 year old Male patient presents to the clinic for initial evaluation of  prostate cancer .   Referred by Dr. Elnoria Howard.  Diagnosis:  Chief Complaint/Diagnosis   69 year old male presenting with a rising PSA to 8.7 biopsy positive for Gleason 6 (3+3) adenocarcinoma prostate for I-125 interstitial implant.  Pathology Report pathology report reviewed   Imaging Report no bone scan ordered and based on the low PSA   Referral Report clinical notes reviewed   Planned Treatment Regimen I-125 interstitial implant   HPI   patient is a pleasant 69 year old male who is noted for the past several years for a slowly rising PSA. Last PSA was 8.7. This prompted a transrectal ultrasound guided biopsy which was positive for lbi lobar adenocarcinoma Gleason score 6 (3+3). Patient has been started on firmagon which has improved some of his urinary frequency and urgency. Patient has nocturia x1-2. He has discussed treatment options with urology he is now referred to arrange oncology for opinion. He is leaning towards interstitial implant. Patient does have erectile dysfunction. No urinary incontinence.patient does not take any prescribed medication but does take significant amount of her herbs this herbal supplements.  Past Hx:    Prostate Cancer:    Hernia repair:   Past, Family and Social History:  Past Medical History positive   Neurological/Psychiatric depression   Past Surgical History herniorrhaphy repair   Family History positive   Family History Comments family history of father with cardia vascular heart disease and suicidal tendencies.   Social History positive   Social History Comments 40-pack-year smoking history quit in 1980s   Additional Past Medical and Surgical History accompanied by his wife today   Review of Systems:  General negative   Performance Status (ECOG) 0   Skin negative   Breast negative   Ophthalmologic negative   ENMT negative    Respiratory and Thorax negative   Cardiovascular negative   Gastrointestinal negative   Genitourinary see HPI   Musculoskeletal negative   Neurological negative   Psychiatric negative   Hematology/Lymphatics negative   Endocrine negative   Allergic/Immunologic negative   Review of Systems   review of systems obtained from nurse's notes  Nursing Notes:  Nursing Vital Signs and Chemo Nursing Nursing Notes: *CC Vital Signs Flowsheet:   17-Dec-14 13:40  Temp Temperature 96  Pulse Pulse 78  Respirations Respirations 18  SBP SBP 136  DBP DBP 79  Current Weight (kg) (kg) 86.7   Physical Exam:  General/Skin/HEENT:  General normal   Skin normal   Eyes normal   ENMT normal   Head and Neck normal   Additional PE well-developed well-nourished slightly obese male in NAD. Lungs are clear to A&P cardiac examination shows regular rate and rhythm. Abdomen is benign. On rectal exam rectal sphincter tone is good prostate measures approximately 50 ml. Sulcus is preserved bilaterally. No mass or nodularity is noted on prostate exam. No other rectal abnormalities identified.   Breasts/Resp/CV/GI/GU:  Respiratory and Thorax normal   Cardiovascular normal   Gastrointestinal normal   Genitourinary normal   MS/Neuro/Psych/Lymph:  Musculoskeletal normal   Neurological normal   Lymphatics normal   Assessment and Plan: Impression:   stage I adenocarcinoma of the prostate in 69 year old male with good histology overall PSA ideal candidate for I-125 interstitial implant. Plan:   at this time again I reviewed all treatment options including watchful waiting, surgery, external beam and brachytherapy with the patient and his wife. They're leaning towards I-125  interstitial implant. Patient does not desire any blood transfusions so radical prostatectomy is adequate question. I've discussed the risks and benefits of treatment and the side effects including possible increased  urinary frequency urgency, possible diarrhea, possible fatigue, and exacerbation erectile dysfunction. Patient comprehended is my treatment plan well. He wants to proceed with volume study. We'll set him up for volume study and followup interstitial implant over the next several months.  I would like to take this opportunity to thank you for allowing me to continue to participate in this patient's care.  CC Referral:  cc: Dr. Elnoria Howard   Electronic Signatures: Baruch Gouty, Roda Shutters (MD)  (Signed 17-Dec-14 14:31)  Authored: HPI, Diagnosis, Past Hx, PFSH, ROS, Nursing Notes, Physical Exam, Encounter Assessment and Plan, CC Referring Physician   Last Updated: 17-Dec-14 14:31 by Armstead Peaks (MD)

## 2014-08-13 NOTE — Op Note (Signed)
PATIENT NAME:  SHERIFF, RODENBERG MR#:  [id number] DATE OF BIRTH:  1945/06/01  DATE OF PROCEDURE:  06/21/2013  PREOPERATIVE DIAGNOSIS: Adenocarcinoma of the prostate.   POSTOPERATIVE DIAGNOSIS: Adenocarcinoma of the prostate.   PROCEDURE: Brachytherapy with cystoscopy.   COMPLICATIONS: Minor seed pellets in the bladder, which I removed.  TOTAL NUMBER OF SEED IMPLANTS IN THE PROSTATE: 41.   ESTIMATED BLOOD LOSS: Less than 5 mL.   DESCRIPTION OF PROCEDURE: With the patient sterilely prepped and draped in the supine lithotomy position for ease of approach to the external genitalia, I do put a Foley catheter in the bladder without difficulty. Then, using both transverse and lateral positioning of the prostate, seed implants are placed according to the presurgical mapping. Seeds are implanted carefully. Once the 90 seeds have been implanted, I checked the bladder and the urethra for any seeds that have extruded into the area. We find several small strings of seeds, total #14, that were removed easily without difficulty with graspers. Bladder is then emptied. KUB postoperatively is done. The seeds are counted, 76 total, as we expected there to be. The patient then has the [employer] utilized to make sure there are no seeds that spilled into the area, and there are none. So, there are no I-125 seeds anywhere outside the patient's prostate. The bladder is emptied. He is sent to recovery in satisfactory condition.   ____________________________ Janice Coffin. Elnoria Howard, DO rdh:lb D: 06/21/2013 14:33:00 ET T: 06/22/2013 09:10:03 ET JOB#: [id number]  cc: Janice Coffin. Elnoria Howard, DO, <Dictator> Keatyn Luck D Willis Kuipers DO ELECTRONICALLY SIGNED 06/25/2013 14:28

## 2014-08-13 NOTE — Op Note (Signed)
PATIENT NAME:  [name], Thomas MR#:  [name] DATE OF BIRTH:  July 21, 1945  DATE OF PROCEDURE:  05/24/2013  PREOPERATIVE DIAGNOSIS: Adenocarcinoma of the prostate, stage TIa.   POSTOPERATIVE DIAGNOSIS: Adenocarcinoma of the prostate, stage TIa.  PROCEDURE: Volume study of the prostate.   ANESTHESIA: None other than local.   DESCRIPTION OF PROCEDURE: With the patient sterilely prepped and draped in the supine lithotomy position, I put a Foley catheter into the bladder. Then we placed a rectal probe into the rectum and take 8 measurements of the prostate at 0.5 cm increments for a total of 4 cm in length. This results in 8 studies of the transverse cuts of the prostate for a volume study for placement of radioactive seed implant in the future. The patient tolerated the procedure well and is sent to recovery in satisfactory condition.     ____________________________ Janice Coffin. Elnoria Howard, [place] rdh:np D: 05/24/2013 13:54:00 ET T: 05/24/2013 19:27:35 ET JOB#: [id number]  cc: Janice Coffin. Elnoria Howard, DO, <Dictator> Karlin Binion D Tameca Jerez DO ELECTRONICALLY SIGNED 06/25/2013 14:28

## 2014-08-13 NOTE — H&P (Signed)
PATIENT NAME:  Thomas [name], Thomas [name] MR#:  [id number] DATE OF BIRTH:  1946-01-05  DATE OF ADMISSION:  05/24/2013  HISTORY OF PRESENT ILLNESS: The patient is a 69 year old male who presented with a PSA of 8.7 which prompted a transrectal ultrasound-guided biopsy positive for bilobar adenocarcinoma with Gleason score of 6 (3 + 3). He has mild urinary symptoms including nocturia x2. He chose to undergo I-125 interstitial implant and is here today for volume study. He does have erectile dysfunction. He otherwise is without complaint.   PAST MEDICAL HISTORY: 1.  Herniorrhaphy repair.  2.  Prostate cancer.  3.  Depression.  4.  Hemorrhoid repair.   FAMILY HISTORY: Father with cardiovascular disease and suicidal tendencies.   SOCIAL HISTORY: A 40 pack-year smoking history, quit in the 80s. No EtOH abuse history.   REVIEW OF SYSTEMS: Unchanged from initial examination performed 04/07/2013.   PHYSICAL EXAMINATION: VITAL SIGNS: Vital signs are stable. Temperature 96, pulse 78 per minute, respirations 18 per minute, BP 136/79.  GENERAL: Well-developed, well-nourished, elderly male, in NAD.  HEENT: Reveals PERRLA. EOMI.  NECK: Clear without evidence of cervical or supraclavicular adenopathy.  LUNGS: Clear to A and P.  HEART: Regular rate and rhythm without murmur, rub, or thrill.  ABDOMEN: Benign with no organomegaly or masses noted.  NEUROLOGIC: Motor, sensory, and DTR levels are equal and symmetric in the upper and lower extremities bilaterally. No peripheral adenopathy or edema is identified.   IMPRESSION: Stage I adenocarcinoma of the prostate in a 69 year old male now seen for volume study and for eventual I-125 interstitial implant.   At this point in time plan is to deliver 145 Gy to his prostatic volume. We performed a volume study today to determine seed placement. Consent was signed after risks and benefits of treatment were reviewed with the patient. He seems to comprehend our overall treatment  plan well.   I would like to take this opportunity to thank you for allowing me to continue to participate in his care. ____________________________ Armstead Peaks, MD gsc:sb D: 05/24/2013 14:49:00 ET T: 05/24/2013 15:14:34 ET JOB#: [id number]  cc: Armstead Peaks, MD, <Dictator> Armstead Peaks MD ELECTRONICALLY SIGNED 07/06/2013 10:25

## 2014-12-29 ENCOUNTER — Encounter: Payer: Self-pay | Admitting: Family Medicine

## 2014-12-29 ENCOUNTER — Ambulatory Visit (INDEPENDENT_AMBULATORY_CARE_PROVIDER_SITE_OTHER): Payer: Commercial Managed Care - HMO | Admitting: Family Medicine

## 2014-12-29 VITALS — BP 138/86 | Temp 97.9°F | Ht 64.0 in | Wt 173.0 lb

## 2014-12-29 DIAGNOSIS — Z79899 Other long term (current) drug therapy: Secondary | ICD-10-CM | POA: Diagnosis not present

## 2014-12-29 DIAGNOSIS — E785 Hyperlipidemia, unspecified: Secondary | ICD-10-CM

## 2014-12-29 DIAGNOSIS — L84 Corns and callosities: Secondary | ICD-10-CM

## 2014-12-29 DIAGNOSIS — C61 Malignant neoplasm of prostate: Secondary | ICD-10-CM

## 2014-12-29 NOTE — Progress Notes (Signed)
   Subjective:    Patient ID: Thomas [name], male    DOB: 1946-03-31, 69 y.o.   MRN: [id number]  HPIleft foot pain. Started about 1 month ago. Pain on bottom of foot.  Patient states that he has had corns buildup on his feet feel very painful he would like to have something done for her. I discussed with him the procedure including trimming it down with a #15 blade he agrees to this. This was done without difficulty.  He also has history of prostate cancer which was treated with radiation but he has not done a good job of following up with specialist we will need to check a PSA  He has a history hyperlipidemia he was encouraged to watch diet stay physically active we will check lipid profile plus also metabolic 7.   Review of Systems He denies chest tightness pressure pain shortness breath nausea vomiting diarrhea hematuria and hematochezia denies joint pain. Denies fever chills sweats or rash    Objective:   Physical Exam  Constitutional: He appears well-nourished. No distress.  Cardiovascular: Normal rate, regular rhythm and normal heart sounds.   No murmur heard. Pulmonary/Chest: Effort normal and breath sounds normal. No respiratory distress.  Musculoskeletal: He exhibits no edema.  Lymphadenopathy:    He has no cervical adenopathy.  Neurological: He is alert.  Psychiatric: His behavior is normal.  Vitals reviewed.         Assessment & Plan:  History prostate cancer check PSA Corn-these were trimmed with the scalpel today he was shown how to help keep this from building up he can also usepads in the feet to help He is due for lab work. He was encouraged to eat a healthy diet stay physically active.

## 2014-12-30 ENCOUNTER — Encounter: Payer: Self-pay | Admitting: Family Medicine

## 2014-12-30 DIAGNOSIS — C61 Malignant neoplasm of prostate: Secondary | ICD-10-CM | POA: Insufficient documentation

## 2014-12-30 DIAGNOSIS — E785 Hyperlipidemia, unspecified: Secondary | ICD-10-CM | POA: Insufficient documentation

## 2014-12-30 LAB — LIPID PANEL
CHOL/HDL RATIO: 5.1 ratio — AB (ref 0.0–5.0)
Cholesterol, Total: 258 mg/dL — ABNORMAL HIGH (ref 100–199)
HDL: 51 mg/dL (ref >39)
LDL CALC: 173 mg/dL — AB (ref 0–99)
TRIGLYCERIDES: 170 mg/dL — AB (ref 0–149)
VLDL CHOLESTEROL CAL: 34 mg/dL (ref 5–40)

## 2014-12-30 LAB — BASIC METABOLIC PANEL
BUN / CREAT RATIO: 10 (ref 10–22)
BUN: 11 mg/dL (ref 8–27)
CALCIUM: 9.3 mg/dL (ref 8.6–10.2)
CO2: 22 mmol/L (ref 18–29)
Chloride: 99 mmol/L (ref 97–108)
Creatinine, Ser: 1.11 mg/dL (ref 0.76–1.27)
GFR, EST AFRICAN AMERICAN: 77 mL/min/{1.73_m2} (ref >59)
GFR, EST NON AFRICAN AMERICAN: 67 mL/min/{1.73_m2} (ref >59)
Glucose: 86 mg/dL (ref 65–99)
POTASSIUM: 4.2 mmol/L (ref 3.5–5.2)
SODIUM: 140 mmol/L (ref 134–144)

## 2014-12-30 LAB — PSA: Prostate Specific Ag, Serum: 1.5 ng/mL (ref 0.0–4.0)

## 2015-03-27 ENCOUNTER — Other Ambulatory Visit: Payer: Self-pay | Admitting: Family Medicine

## 2015-03-27 NOTE — Telephone Encounter (Signed)
May have this in 2 additional refills

## 2015-03-29 ENCOUNTER — Other Ambulatory Visit: Payer: Self-pay | Admitting: *Deleted

## 2015-03-29 ENCOUNTER — Inpatient Hospital Stay: Payer: Commercial Managed Care - HMO | Attending: Radiation Oncology

## 2015-03-29 ENCOUNTER — Encounter: Payer: Self-pay | Admitting: Radiation Oncology

## 2015-03-29 ENCOUNTER — Ambulatory Visit
Admission: RE | Admit: 2015-03-29 | Discharge: 2015-03-29 | Disposition: A | Discharge status: Home / Self Care | Payer: Commercial Managed Care - HMO | Source: Ambulatory Visit | Attending: Radiation Oncology | Admitting: Radiation Oncology

## 2015-03-29 VITALS — BP 129/87 | HR 85 | Temp 95.1°F | Resp 18 | Wt 175.2 lb

## 2015-03-29 DIAGNOSIS — C61 Malignant neoplasm of prostate: Secondary | ICD-10-CM

## 2015-03-29 LAB — PSA: PSA: 1.54 ng/mL (ref 0.00–4.00)

## 2015-03-29 NOTE — Progress Notes (Signed)
Radiation Oncology Follow up Note  Name: Thomas [name]   Date:   03/29/2015 MRN:  [id number] DOB: [birth year]    This 69 y.o. male presents to the clinic today for follow-up for prostate cancer status post I-125 interstitial implant.  REFERRING PROVIDER: [name], MD  HPI: Patient is a 69 year old male now out 2 years having completed I-125 interstitial implant for Gleason 6 (3+3) adenocarcinoma presenting with a PSA of 8.7. He is seen today in routine follow-up and is doing well specifically denies diarrhea dysuria or any other GI/GU complaints. His PSAs have been in her in the 1-1.5 range. His PSA was tested today..  COMPLICATIONS OF TREATMENT: none  FOLLOW UP COMPLIANCE: keeps appointments   PHYSICAL EXAM:  BP 129/87 mmHg  Pulse 85  Temp(Src) 95.1 F (35.1 C)  Resp 18  Wt 175 lb 2.5 oz (79.45 kg) On rectal exam rectal sphincter tone is good. Prostate is smooth contracted without evidence of nodularity or mass. Sulcus is preserved bilaterally. No discrete nodularity is identified. No other rectal abnormalities are noted. Well-developed well-nourished patient in NAD. HEENT reveals PERLA, EOMI, discs not visualized.  Oral cavity is clear. No oral mucosal lesions are identified. Neck is clear without evidence of cervical or supraclavicular adenopathy. Lungs are clear to A&P. Cardiac examination is essentially unremarkable with regular rate and rhythm without murmur rub or thrill. Abdomen is benign with no organomegaly or masses noted. Motor sensory and DTR levels are equal and symmetric in the upper and lower extremities. Cranial nerves II through XII are grossly intact. Proprioception is intact. No peripheral adenopathy or edema is identified. No motor or sensory levels are noted. Crude visual fields are within normal range.  RADIOLOGY RESULTS: No current films for review  PLAN: I've run a PSA level on him today and will report that separately. Otherwise he continues to do well  under good biochemical control of his prostate cancer. I have asked to see him back in 1 year for follow-up. Patient knows to call sooner with any concerns.  I would like to take this opportunity for allowing me to participate in the care of your patient.Armstead Peaks., MD

## 2015-03-30 ENCOUNTER — Encounter: Payer: Self-pay | Admitting: Nurse Practitioner

## 2015-03-30 ENCOUNTER — Encounter: Payer: Self-pay | Admitting: Family Medicine

## 2015-03-30 ENCOUNTER — Other Ambulatory Visit: Payer: Self-pay | Admitting: Nurse Practitioner

## 2015-03-30 ENCOUNTER — Ambulatory Visit (INDEPENDENT_AMBULATORY_CARE_PROVIDER_SITE_OTHER): Payer: Commercial Managed Care - HMO | Admitting: Nurse Practitioner

## 2015-03-30 VITALS — BP 122/72 | Temp 98.2°F | Ht 64.0 in | Wt 177.2 lb

## 2015-03-30 DIAGNOSIS — J0110 Acute frontal sinusitis, unspecified: Secondary | ICD-10-CM

## 2015-03-30 MED ORDER — METHYLPREDNISOLONE ACETATE 40 MG/ML IJ SUSP
40.0000 mg | Freq: Once | INTRAMUSCULAR | Status: AC
  Administered 2015-03-30: 40 mg via INTRAMUSCULAR

## 2015-03-30 MED ORDER — AZITHROMYCIN 250 MG PO TABS: ORAL_TABLET | ORAL | Status: DC

## 2015-04-01 ENCOUNTER — Encounter: Payer: Self-pay | Admitting: Nurse Practitioner

## 2015-04-01 NOTE — Progress Notes (Signed)
Subjective:  Presents for c/o cough and congestion x 4 d. Frequent productive cough. Runny nose. Sore throat. Frontal area headache. No ear pain. No wheezing.   Objective:   BP 122/72 mmHg  Temp(Src) 98.2 F (36.8 C) (Oral)  Ht 5\' 4"  (1.626 m)  Wt 177 lb 3.2 oz (80.377 kg)  BMI 30.40 kg/m2 NAD. Alert, oriented. TMs clear effusion. Pharynx injected with PND noted. Neck supple with mild anterior adenopathy. Lungs clear. Heart RRR.   Assessment: Acute frontal sinusitis, recurrence not specified - Plan: methylPREDNISolone acetate (DEPO-MEDROL) injection 40 mg  Plan:  Meds ordered this encounter  Medications  . azithromycin (ZITHROMAX Z-PAK) 250 MG tablet    Sig: Take 2 tablets (500 mg) on  Day 1,  followed by 1 tablet (250 mg) once daily on Days 2 through 5.    Dispense:  6 each    Refill:  0    Order Specific Question:  Supervising Provider    Answer:  Nashon Kirschner [[id number]]  . methylPREDNISolone acetate (DEPO-MEDROL) injection 40 mg    Sig:    OTC meds as directed. Call back if worsens or persists. Patient does not qualify for low dose CT lung cancer screening.

## 2015-04-04 ENCOUNTER — Telehealth: Payer: Self-pay | Admitting: Family Medicine

## 2015-04-04 MED ORDER — ALBUTEROL SULFATE HFA 108 (90 BASE) MCG/ACT IN AERS: 2.0000 | INHALATION_SPRAY | Freq: Four times a day (QID) | RESPIRATORY_TRACT | Status: DC | PRN

## 2015-04-04 NOTE — Telephone Encounter (Signed)
Pt wife requested inhaler, pt will fu if worse

## 2015-04-18 ENCOUNTER — Telehealth: Payer: Self-pay | Admitting: Family Medicine

## 2015-04-18 NOTE — Telephone Encounter (Signed)
Having tightness in chest, cough, and sob. Consult with dr Nicki Reaper. Er tonight if worse. appt tomorrow. Pt transferred to front to schedule ov.

## 2015-04-18 NOTE — Telephone Encounter (Signed)
Pt called stating that he was seen almost two weeks ago and his cough came back yesterday and is having pains in his chest. Pt wants to know if he needs to be re seen or if something can be called in.  Larene Pickett

## 2015-04-19 ENCOUNTER — Ambulatory Visit: Payer: Commercial Managed Care - HMO | Admitting: Nurse Practitioner

## 2015-04-26 ENCOUNTER — Ambulatory Visit (INDEPENDENT_AMBULATORY_CARE_PROVIDER_SITE_OTHER): Payer: Commercial Managed Care - HMO | Admitting: Family Medicine

## 2015-04-26 ENCOUNTER — Encounter: Payer: Self-pay | Admitting: Family Medicine

## 2015-04-26 VITALS — BP 130/80 | Temp 98.7°F | Ht 64.0 in | Wt 178.0 lb

## 2015-04-26 DIAGNOSIS — B9689 Other specified bacterial agents as the cause of diseases classified elsewhere: Secondary | ICD-10-CM

## 2015-04-26 DIAGNOSIS — J019 Acute sinusitis, unspecified: Secondary | ICD-10-CM | POA: Diagnosis not present

## 2015-04-26 DIAGNOSIS — Z23 Encounter for immunization: Secondary | ICD-10-CM

## 2015-04-26 MED ORDER — AMOXICILLIN-POT CLAVULANATE 875-125 MG PO TABS: 1.0000 | ORAL_TABLET | Freq: Two times a day (BID) | ORAL | Status: DC

## 2015-04-26 NOTE — Progress Notes (Signed)
   Subjective:    Patient ID: Thomas [name], male    DOB: 1946-03-13, 70 y.o.   MRN: [id number]  Cough This is a new problem. The current episode started 1 to 4 weeks ago. Associated symptoms include nasal congestion, rhinorrhea, a sore throat and wheezing. Pertinent negatives include no chest pain, ear pain or fever. Treatments tried: rocephiin, albuterol and z pack.    patient with about a week and half a head congestion drainage coughing sinus pressure tried other medicines don't really seem to get better with that now having a lot of sinus pressure denies high fever chills sweats or wheezing or difficulty breathing   Review of Systems  Constitutional: Negative for fever and activity change.  HENT: Positive for congestion, rhinorrhea and sore throat. Negative for ear pain.   Eyes: Negative for discharge.  Respiratory: Positive for cough and wheezing.   Cardiovascular: Negative for chest pain.       Objective:   Physical Exam  Constitutional: He appears well-developed.  HENT:  Head: Normocephalic.  Mouth/Throat: Oropharynx is clear and moist. No oropharyngeal exudate.  Neck: Normal range of motion.  Cardiovascular: Normal rate, regular rhythm and normal heart sounds.   No murmur heard. Pulmonary/Chest: Effort normal and breath sounds normal. He has no wheezes.  Lymphadenopathy:    He has no cervical adenopathy.  Neurological: He exhibits normal muscle tone.  Skin: Skin is warm and dry.  Nursing note and vitals reviewed.         Assessment & Plan:   viral syndrome Acute rhinosinusitis Antibodies prescribed warning signs discussed follow-up if problems

## 2015-04-26 NOTE — Patient Instructions (Signed)
Augmentin was sent into [facility]  If not improving over the next week call us.

## 2015-04-28 ENCOUNTER — Other Ambulatory Visit: Payer: Self-pay | Admitting: *Deleted

## 2015-04-28 ENCOUNTER — Telehealth: Payer: Self-pay | Admitting: Family Medicine

## 2015-04-28 MED ORDER — CEFDINIR 300 MG PO CAPS: 300.0000 mg | ORAL_CAPSULE | Freq: Two times a day (BID) | ORAL | Status: DC

## 2015-04-28 NOTE — Telephone Encounter (Signed)
Pt called stating that he had a reaction to the medicine that he was prescribed and quit taking it and is wanting something else called in.    [facility]

## 2015-04-28 NOTE — Telephone Encounter (Signed)
Med sent to pharm. augmentin added to allergy list. Pt notified.

## 2015-04-28 NOTE — Telephone Encounter (Signed)
Document sensitivity. omnicef 300 bid ten d

## 2015-04-28 NOTE — Telephone Encounter (Signed)
Patient was prescribed Augmentin on 04/26/15

## 2015-06-28 ENCOUNTER — Ambulatory Visit: Payer: Commercial Managed Care - HMO | Admitting: Family Medicine

## 2015-07-25 ENCOUNTER — Ambulatory Visit: Payer: Commercial Managed Care - HMO | Admitting: Family Medicine

## 2015-08-01 ENCOUNTER — Encounter: Payer: Self-pay | Admitting: Family Medicine

## 2015-08-01 ENCOUNTER — Ambulatory Visit (INDEPENDENT_AMBULATORY_CARE_PROVIDER_SITE_OTHER): Payer: Commercial Managed Care - HMO | Admitting: Family Medicine

## 2015-08-01 VITALS — BP 116/76 | Ht 64.0 in | Wt 178.5 lb

## 2015-08-01 DIAGNOSIS — Z131 Encounter for screening for diabetes mellitus: Secondary | ICD-10-CM

## 2015-08-01 DIAGNOSIS — E785 Hyperlipidemia, unspecified: Secondary | ICD-10-CM

## 2015-08-01 MED ORDER — ALPRAZOLAM 1 MG PO TABS: ORAL_TABLET | ORAL | Status: DC

## 2015-08-01 NOTE — Progress Notes (Signed)
   Subjective:    Patient ID: Thomas [name], male    DOB: 06/25/1945, 70 y.o.   MRN: [id number]  Hyperlipidemia This is a chronic problem. The current episode started more than 1 year ago. Pertinent negatives include no chest pain. There are no compliance problems.    Patient states no other concerns this visit.   Review of Systems  Constitutional: Negative for activity change, appetite change and fatigue.  HENT: Negative for congestion.   Respiratory: Negative for cough.   Cardiovascular: Negative for chest pain.  Gastrointestinal: Negative for abdominal pain.  Endocrine: Negative for polydipsia and polyphagia.  Neurological: Negative for weakness.  Psychiatric/Behavioral: Negative for confusion.       Objective:   Physical Exam  Constitutional: He appears well-nourished. No distress.  Cardiovascular: Normal rate, regular rhythm and normal heart sounds.   No murmur heard. Pulmonary/Chest: Effort normal and breath sounds normal. No respiratory distress.  Musculoskeletal: He exhibits no edema.  Lymphadenopathy:    He has no cervical adenopathy.  Neurological: He is alert.  Psychiatric: His behavior is normal.  Vitals reviewed.         Assessment & Plan:  Hyperlipidemia-overall patient trying hard taking red rice C6 extract we will check lab work he may need to be on a statin we did discuss pros and cons he will consider this for now new  Glucose screening for diabetes  The importance of healthy eating was recommended  Patient encouraged try to lose weight follow-up 6 months

## 2015-08-02 LAB — LIPID PANEL
CHOL/HDL RATIO: 4.7 ratio (ref 0.0–5.0)
Cholesterol, Total: 243 mg/dL — ABNORMAL HIGH (ref 100–199)
HDL: 52 mg/dL (ref >39)
LDL CALC: 164 mg/dL — AB (ref 0–99)
TRIGLYCERIDES: 134 mg/dL (ref 0–149)
VLDL Cholesterol Cal: 27 mg/dL (ref 5–40)

## 2015-08-02 LAB — GLUCOSE, RANDOM: Glucose: 91 mg/dL (ref 65–99)

## 2015-09-11 ENCOUNTER — Ambulatory Visit (INDEPENDENT_AMBULATORY_CARE_PROVIDER_SITE_OTHER): Payer: Commercial Managed Care - HMO | Admitting: Family Medicine

## 2015-09-11 ENCOUNTER — Encounter: Payer: Self-pay | Admitting: Family Medicine

## 2015-09-11 VITALS — BP 126/78 | Ht 64.0 in | Wt 174.0 lb

## 2015-09-11 DIAGNOSIS — R079 Chest pain, unspecified: Secondary | ICD-10-CM

## 2015-09-11 DIAGNOSIS — K2100 Gastro-esophageal reflux disease with esophagitis, without bleeding: Secondary | ICD-10-CM

## 2015-09-11 MED ORDER — PANTOPRAZOLE SODIUM 40 MG PO TBEC: 40.0000 mg | DELAYED_RELEASE_TABLET | Freq: Every day | ORAL | Status: DC

## 2015-09-11 NOTE — Patient Instructions (Signed)
I would recommend that you use a stronger acid blocker.Protonix 40 mg 1 daily. You should have very minimal reflux symptoms with this. If you have ongoing troubles the next step will be for rest help set you up with gastroenterology for what's called a endoscopy.  Should you start having any trouble with food getting stuck or not going down it is very important you also let us know about this because we would 1 a set you up with gastroenterology if this was occurring.  I would recommend using the stronger acid blocker over the course of the next 6-8 weeks then gradually tapering off if you have ongoing troubles with reflux it is important for you to let us know ordered to follow-up.

## 2015-09-11 NOTE — Progress Notes (Signed)
   Subjective:    Patient ID: Thomas [name], male    DOB: December 10, 1945, 70 y.o.   MRN: [id number]  HPIhad reflux on Sunday and this am. Better now. Pt states got better after he called.  Ate spicy food Saturday night. Was having vomiting.   Takes prilosec otc. Just started taking about one month ago.  This patient states he is having intermittent vomiting on Sunday had a hard time eating or drinking anything he states typically he can chew swallow without difficulty no dysphagia and he states he typically was taken acid blocker until the past couple months he states he ate some spicy food on Saturday night he thinks that triggered it he denies any angina symptoms no chest tightness pressure pain or shortness of breath denies sweats or chills. The reflux symptoms go up into his chest do not radiate into his neck do not radiate down the arm there is been no rectal bleeding. Patient does have elevated cholesterol in his been hesitant to be on any medications  Review of Systems  Constitutional: Negative for fever and fatigue.  HENT: Negative for congestion.   Denies true angina denies abdominal discomfort except for epigastric discomfort over the past couple days denies sweats or chills states he feels better today     Objective:   Physical Exam  Constitutional: He appears well-nourished. No distress.  Cardiovascular: Normal rate, regular rhythm and normal heart sounds.   No murmur heard. Pulmonary/Chest: Effort normal and breath sounds normal. No respiratory distress.  Musculoskeletal: He exhibits no edema.  Lymphadenopathy:    He has no cervical adenopathy.  Neurological: He is alert.  Psychiatric: His behavior is normal.  Vitals reviewed.   Warning signs regarding cardiac issues discussed in detail      Assessment & Plan:  Maalox Mylanta recommended need a prescription PPI recommended if progressive troubles or worse follow-up EKG looked good. I doubt this is cardiac. If patient  is not dramatically better within 2 weeks he is to let us know he is to follow-up sooner problems

## 2015-09-27 ENCOUNTER — Telehealth: Payer: Self-pay | Admitting: Family Medicine

## 2015-09-27 NOTE — Telephone Encounter (Signed)
ERROR

## 2015-10-04 ENCOUNTER — Telehealth: Payer: Self-pay | Admitting: *Deleted

## 2015-10-04 NOTE — Telephone Encounter (Signed)
Pt called out of town and got a piece of rubber in his eye. Pt states he thinks he got it out but his eye is burning, red and swollen. Some pain. Pt wanted appt for tomorrow when he comes back in town. Consult with dr Richardson Landry and pt needs to go directly to ED where he is at. Discussed with pt that he should have eye checked at ED.

## 2015-11-29 ENCOUNTER — Encounter: Payer: Self-pay | Admitting: Emergency Medicine

## 2015-11-29 ENCOUNTER — Ambulatory Visit (INDEPENDENT_AMBULATORY_CARE_PROVIDER_SITE_OTHER): Payer: Self-pay | Admitting: Emergency Medicine

## 2015-11-29 VITALS — BP 130/80 | HR 90 | Temp 97.6°F | Resp 18 | Ht 65.0 in | Wt 180.4 lb

## 2015-11-29 DIAGNOSIS — K219 Gastro-esophageal reflux disease without esophagitis: Secondary | ICD-10-CM

## 2015-11-29 DIAGNOSIS — Z024 Encounter for examination for driving license: Secondary | ICD-10-CM

## 2015-11-29 DIAGNOSIS — G4700 Insomnia, unspecified: Secondary | ICD-10-CM

## 2015-11-29 DIAGNOSIS — Z021 Encounter for pre-employment examination: Secondary | ICD-10-CM

## 2015-11-29 NOTE — Progress Notes (Signed)
Patient ID: Thomas [name], male   DOB: 06-11-1945, 70 y.o.   MRN: [id number]    By signing my name below I, Tereasa Coop, attest that this documentation has been prepared under the direction and in the presence of Arlyss Queen, MD. Electonically Signed. Tereasa Coop, Scribe 11/29/2015 at 8:39 AM  Chief Complaint:  Chief Complaint  Patient presents with  . Employment Physical    DOT    HPI: Thomas [name] is a 70 y.o. male who reports to [facility] today for a DOT physical. Pt drives mostly in [place] and is home every night. Pt reports that he is healthy.  Pt denies any fatigue, fever chills, CP, SOB, syncope, seizures, HA, numbness, unexplained weight loss, or dizziness.   Pt takes Nexium for GERD.  Takes xanax at night before going to bed to control anxiety. Pt reports that he only takes half a pill.  Pt denies any history of CAD or heart problems. Pt also denies history of sleep apnea, pt has not had a sleep study. Pt also denies any history of HTN, HLD, DM, lung disease, kidney problems, stroke, blood clots, hospitalizations, eye problems, brain injury, brain illness, seizures, history of drug abuse, or alcohol use.  Pt does wear reading glasses to read only.  Smoked cigarettes in the 80s.   Pt broke his rt 3rd finger when he was in high school.  Had prostate cancer, implanted radiation seeds 3 years ago.    Past Medical History:  Diagnosis Date  . Asthma 1998  . Diverticulitis   . Elevated PSA   . GERD (gastroesophageal reflux disease)   . Hyperlipidemia    Past Surgical History:  Procedure Laterality Date  . HERNIA REPAIR  1999  . prostate cancer    . TRANSURETHRAL RESECTION OF PROSTATE    . VASECTOMY     Social History   Social History  . Marital status: Married    Spouse name: N/A  . Number of children: N/A  . Years of education: N/A   Social History Main Topics  . Smoking status: Former Smoker    Types: Cigarettes    Quit date: 04/22/1982  . Smokeless  tobacco: None  . Alcohol use None  . Drug use: Unknown  . Sexual activity: Not Asked   Other Topics Concern  . None   Social History Narrative  . None   Family History  Problem Relation Age of Onset  . Heart disease Father   . Hypertension Brother   . Diabetes Brother    Allergies  Allergen Reactions  . Augmentin [Amoxicillin-Pot Clavulanate]     Gi upset  . Ciprofloxacin   . Nsaids Other (See Comments)    Stomach ulcer    Prior to Admission medications   Medication Sig Start Date End Date Taking? Authorizing Provider  albuterol (PROVENTIL HFA;VENTOLIN HFA) 108 (90 BASE) MCG/ACT inhaler Inhale 2 puffs into the lungs every 6 (six) hours as needed for wheezing. 04/04/15   [name], MD  ALPRAZolam Duanne Moron) 1 MG tablet TAKE 1 TABLET AT BEDTIME AS NEEDED FOR ANXIETY 08/01/15   [name], MD  cetirizine (ZYRTEC) 10 MG tablet Take 10 mg by mouth daily.    Historical Provider, MD  co-enzyme Q-10 50 MG capsule Take 50 mg by mouth daily.    Historical Provider, MD  ferrous sulfate 325 (65 FE) MG tablet Take 1 tablet (325 mg total) by mouth daily with breakfast. 07/19/13   Loralie Champagne, PA-C  Multiple Vitamins-Minerals (MULTIVITAMIN WITH MINERALS) tablet Take 1 tablet by mouth daily.    Historical Provider, MD  pantoprazole (PROTONIX) 40 MG tablet Take 1 tablet (40 mg total) by mouth daily. 09/11/15   [name], MD  Red Yeast Rice Extract (RED YEAST RICE PO) Take by mouth.    Historical Provider, MD     ROS: The patient denies fevers, chills, night sweats, unintentional weight loss, chest pain, palpitations, wheezing, dyspnea on exertion, nausea, vomiting, abdominal pain, dysuria, hematuria, melena, numbness, weakness, or tingling.  All other systems have been reviewed and were otherwise negative with the exception of those mentioned in the HPI and as above.    PHYSICAL EXAM: Vitals:   11/29/15 0814  Pulse: 90  Resp: 18   Body mass index is 30.02  kg/m.   General: Alert, no acute distress HEENT:  Normocephalic, atraumatic, oropharynx patent. No thyromegaly. Cerumen in left ear canal. Rt TM normal.  Eye: EOMI, PEERLDC Cardiovascular:  Regular rate and rhythm, no rubs murmurs or gallops.  No Carotid bruits, radial pulse intact. No pedal edema.  Respiratory: Clear to auscultation bilaterally.  No wheezes, rales, or rhonchi.  No cyanosis, no use of accessory musculature Abdominal: No organomegaly, abdomen is soft and non-tender, positive bowel sounds.  No masses. Musculoskeletal: Gait intact. No edema, tenderness. Pt has moderate thoracic kyphosis. Skin: No rashes. Neurologic: Facial musculature symmetric. Psychiatric: Patient acts appropriately throughout our interaction. Lymphatic: No cervical or submandibular lymphadenopathy   LABS:    EKG/XRAY:   Primary read interpreted by Dr. Everlene Farrier at [facility].   ASSESSMENT/PLAN: Patient meets standards for DOT card. He has a history of prostate cancer treated with seed implants. He takes Xanax only at night to sleep. He is on reflux medication. He has no history of ongoing heart are lung disease. He qualifies for one year card without restriction.I personally performed the services described in this documentation, which was scribed in my presence. The recorded information has been reviewed and is accurate.    Gross sideeffects, risk and benefits, and alternatives of medications d/w patient. Patient is aware that all medications have potential sideeffects and we are unable to predict every sideeffect or drug-drug interaction that may occur.  Arlyss Queen MD 11/29/2015 8:20 AM

## 2015-11-29 NOTE — Patient Instructions (Signed)
     IF you received an x-ray today, you will receive an invoice from [facility]. Please contact [facility] at [phone] with questions or concerns regarding your invoice.   IF you received labwork today, you will receive an invoice from [facility]. Please contact [facility] at [phone] with questions or concerns regarding your invoice.   Our billing staff will not be able to assist you with questions regarding bills from these companies.  You will be contacted with the lab results as soon as they are available. The fastest way to get your results is to activate your My Chart account. Instructions are located on the last page of this paperwork. If you have not heard from us regarding the results in 2 weeks, please contact this office.      

## 2016-01-29 ENCOUNTER — Encounter: Payer: Self-pay | Admitting: Family Medicine

## 2016-01-29 ENCOUNTER — Ambulatory Visit (INDEPENDENT_AMBULATORY_CARE_PROVIDER_SITE_OTHER): Payer: Commercial Managed Care - HMO | Admitting: Family Medicine

## 2016-01-29 VITALS — BP 128/82 | Temp 98.2°F | Ht 66.0 in | Wt 179.0 lb

## 2016-01-29 DIAGNOSIS — R062 Wheezing: Secondary | ICD-10-CM

## 2016-01-29 DIAGNOSIS — J209 Acute bronchitis, unspecified: Secondary | ICD-10-CM | POA: Diagnosis not present

## 2016-01-29 MED ORDER — PREDNISONE 20 MG PO TABS: ORAL_TABLET | ORAL | 0 refills | Status: DC

## 2016-01-29 MED ORDER — ALBUTEROL SULFATE HFA 108 (90 BASE) MCG/ACT IN AERS: 2.0000 | INHALATION_SPRAY | Freq: Four times a day (QID) | RESPIRATORY_TRACT | 2 refills | Status: DC | PRN

## 2016-01-29 MED ORDER — AZITHROMYCIN 250 MG PO TABS: ORAL_TABLET | ORAL | 0 refills | Status: DC

## 2016-01-29 NOTE — Progress Notes (Signed)
   Subjective:    Patient ID: Thomas [name], male    DOB: 05/30/1945, 70 y.o.   MRN: [id number]  Sinusitis  This is a new problem. Episode onset: 3-4 weeks. Associated symptoms include congestion and coughing. Pertinent negatives include no ear pain. (Wheezing ) Treatments tried: mucinex.   The patient relates fair amount head congestion drainage coughing also chest congestion some wheezing denies shortness of breath denies sweats and chills.   Review of Systems  Constitutional: Negative for activity change and fever.  HENT: Positive for congestion and rhinorrhea. Negative for ear pain.   Eyes: Negative for discharge.  Respiratory: Positive for cough. Negative for wheezing.   Cardiovascular: Negative for chest pain.       Objective:   Physical Exam  Constitutional: He appears well-developed.  HENT:  Head: Normocephalic.  Mouth/Throat: Oropharynx is clear and moist. No oropharyngeal exudate.  Neck: Normal range of motion.  Cardiovascular: Normal rate, regular rhythm and normal heart sounds.   No murmur heard. Pulmonary/Chest: Effort normal and breath sounds normal. He has no wheezes.  Lymphadenopathy:    He has no cervical adenopathy.  Neurological: He exhibits normal muscle tone.  Skin: Skin is warm and dry.  Nursing note and vitals reviewed.         Assessment & Plan:  Viral syndrome Secondary rhinosinusitis Reactive airway Albuterol on a regular basis Short course prednisone Antibiotics prescribed warning signs discussed follow-up if progressive troubles I do not feel the patient needs a chest x-ray lab work currently

## 2016-01-31 ENCOUNTER — Ambulatory Visit: Payer: Commercial Managed Care - HMO | Admitting: Family Medicine

## 2016-03-25 ENCOUNTER — Other Ambulatory Visit: Payer: Self-pay | Admitting: Family Medicine

## 2016-03-28 ENCOUNTER — Ambulatory Visit: Payer: Commercial Managed Care - HMO | Admitting: Radiation Oncology

## 2016-03-28 ENCOUNTER — Inpatient Hospital Stay: Payer: Commercial Managed Care - HMO

## 2016-04-25 ENCOUNTER — Encounter: Payer: Self-pay | Admitting: Family Medicine

## 2016-04-25 ENCOUNTER — Ambulatory Visit (INDEPENDENT_AMBULATORY_CARE_PROVIDER_SITE_OTHER): Payer: PPO | Admitting: Family Medicine

## 2016-04-25 VITALS — BP 120/78 | Temp 97.9°F | Ht 66.0 in | Wt 188.4 lb

## 2016-04-25 DIAGNOSIS — J0110 Acute frontal sinusitis, unspecified: Secondary | ICD-10-CM | POA: Diagnosis not present

## 2016-04-25 MED ORDER — AZITHROMYCIN 250 MG PO TABS: ORAL_TABLET | ORAL | 0 refills | Status: DC

## 2016-04-25 NOTE — Progress Notes (Signed)
   Subjective:    Patient ID: Thomas [name], male    DOB: 07/05/1945, 71 y.o.   MRN: [id number]  Cough  This is a new problem. The current episode started in the past 7 days. The problem has been unchanged. Associated symptoms include wheezing. Associated symptoms comments: Runny nose. Nothing aggravates the symptoms. Treatments tried: nyquil, dayquil. The treatment provided no relief.   Some prod cough  Frontal headache, pressure, sharp at tinmes, worse with chang eof position  Took nyquil Helped a little  Used prn albuterol prn     Review of Systems  Respiratory: Positive for cough and wheezing.        Objective:   Physical Exam Alert, mild malaise. Hydration good Vitals stable. frontal/ maxillary tenderness evident positive nasal congestion. pharynx normal neck supple  lungs clear/no crackles or wheezes. heart regular in rhythm        Assessment & Plan:  Impression rhinosinusitis likely post viral, discussed with patient. plan antibiotics prescribed. Questions answered. Symptomatic care discussed. warning signs discussed. WSL

## 2016-04-29 ENCOUNTER — Ambulatory Visit: Payer: Commercial Managed Care - HMO | Admitting: Family Medicine

## 2016-05-06 ENCOUNTER — Other Ambulatory Visit: Payer: Self-pay | Admitting: *Deleted

## 2016-05-06 ENCOUNTER — Other Ambulatory Visit: Payer: PPO

## 2016-05-06 ENCOUNTER — Ambulatory Visit: Payer: PPO | Admitting: Radiation Oncology

## 2016-05-20 ENCOUNTER — Other Ambulatory Visit: Payer: Self-pay | Admitting: *Deleted

## 2016-05-20 ENCOUNTER — Ambulatory Visit
Admission: RE | Admit: 2016-05-20 | Discharge: 2016-05-20 | Disposition: A | Discharge status: Home / Self Care | Payer: PPO | Source: Ambulatory Visit | Attending: Radiation Oncology | Admitting: Radiation Oncology

## 2016-05-20 ENCOUNTER — Inpatient Hospital Stay: Payer: PPO | Attending: Radiation Oncology

## 2016-05-20 ENCOUNTER — Encounter: Payer: Self-pay | Admitting: Radiation Oncology

## 2016-05-20 VITALS — BP 113/78 | HR 99 | Temp 97.8°F | Resp 18 | Wt 184.1 lb

## 2016-05-20 DIAGNOSIS — C61 Malignant neoplasm of prostate: Secondary | ICD-10-CM

## 2016-05-20 DIAGNOSIS — Z923 Personal history of irradiation: Secondary | ICD-10-CM | POA: Insufficient documentation

## 2016-05-20 LAB — PSA: PSA: 2.4 ng/mL (ref 0.00–4.00)

## 2016-05-20 NOTE — Progress Notes (Signed)
Radiation Oncology Follow up Note  Name: Thomas [name]   Date:   05/20/2016 MRN:  [id number] DOB: [birth year]    This 71 y.o. male presents to the clinic today for 1 year follow-up status post I-125 interstitial implant for Gleason 6 adenocarcinoma of the prostate.  REFERRING PROVIDER: [name], MD  HPI: Patient is a 71 year old male now out 1 year having completed I-125 interstitial implant for Gleason 6 (3+3) adenocarcinoma presenting the PSA of 8.7. He is seen today in routine follow-up and is doing well. Does complain of some occasional testicular tenderness no real pain. He specifically denies diarrhea dysuria or any other GI/GU complaints.  COMPLICATIONS OF TREATMENT: none  FOLLOW UP COMPLIANCE: keeps appointments   PHYSICAL EXAM:  BP 113/78   Pulse 99   Temp 97.8 F (36.6 C)   Resp 18   Wt 184 lb 1.4 oz (83.5 kg)   BMI 29.71 kg/m  On rectal exam rectal sphincter tone is good. Prostate is smooth contracted without evidence of nodularity or mass. Sulcus is preserved bilaterally. No discrete nodularity is identified. No other rectal abnormalities are noted. Well-developed well-nourished patient in NAD. HEENT reveals PERLA, EOMI, discs not visualized.  Oral cavity is clear. No oral mucosal lesions are identified. Neck is clear without evidence of cervical or supraclavicular adenopathy. Lungs are clear to A&P. Cardiac examination is essentially unremarkable with regular rate and rhythm without murmur rub or thrill. Abdomen is benign with no organomegaly or masses noted. Motor sensory and DTR levels are equal and symmetric in the upper and lower extremities. Cranial nerves II through XII are grossly intact. Proprioception is intact. No peripheral adenopathy or edema is identified. No motor or sensory levels are noted. Crude visual fields are within normal range.  RADIOLOGY RESULTS: No current films for review  PLAN: Present time patient is doing well his PSAs have leveled at  1.5. I've repeated that today and will report that separately. Otherwise I'm please was overall progress. I've asked to see him back in 1 year for follow-up. Should his PSA changed may alter my follow-up treatment plans. Patient knows to call with any concerns.  I would like to take this opportunity to thank you for allowing me to participate in the care of your patient.Armstead Peaks., MD

## 2016-05-27 ENCOUNTER — Telehealth: Payer: Self-pay | Admitting: Family Medicine

## 2016-05-27 MED ORDER — AZITHROMYCIN 250 MG PO TABS: ORAL_TABLET | ORAL | 0 refills | Status: DC

## 2016-05-27 NOTE — Telephone Encounter (Signed)
Spoke with patient and verified that he does not have any fevers or difficulty breathing. Informed patient per Dr.Scott [name]-I recommend a Z-Pak, if not improving over the course of the next week or if any difficulty breathing fevers or increased chest congestion it is very important to follow-up right away-also if illness does not go away over the course of the next 7-10 days it would be in his best interest to be seen. Patient verbalized understanding.

## 2016-05-27 NOTE — Telephone Encounter (Signed)
Patient saw Dr. Richardson Landry in January for cough.  Did well on meds but now cough is coming back. No fever. Exact same symptoms as before.  Can we just call in another round of antibiotics?   Uses [facility]. Call patient at [phone].  Thanks!

## 2016-05-27 NOTE — Telephone Encounter (Signed)
Nurse's-please talk with patient make sure he is not experiencing any high fevers or difficulty breathing in his chest-if those areas are okay then I recommend a Z-Pak, if not improving over the course of the next week or if any difficulty breathing fevers or increased chest congestion it is very important to follow-up right away-also if illness does not go away over the course of the next 7-10 days it would be in his best interest to be seen

## 2016-08-13 ENCOUNTER — Telehealth: Payer: Self-pay | Admitting: Family Medicine

## 2016-08-13 NOTE — Telephone Encounter (Signed)
Please explained to family-this is a controlled medicine-we can give 30 tablets currently he needs a medical office visit before we can grant a larger prescription-last time he was seen for medical medicine check was approximately 1 year ago-his visits for sinus infections do not kill is a medicine checkup

## 2016-08-13 NOTE — Telephone Encounter (Signed)
Requesting Rx for Alprazolam.  Thomas [name] is supposed to call back with the name of the mail order pharmacy.

## 2016-08-13 NOTE — Telephone Encounter (Signed)
Last med check 08/01/15

## 2016-08-14 ENCOUNTER — Other Ambulatory Visit: Payer: Self-pay | Admitting: *Deleted

## 2016-08-14 MED ORDER — ALPRAZOLAM 1 MG PO TABS: ORAL_TABLET | ORAL | 0 refills | Status: DC

## 2016-08-14 NOTE — Telephone Encounter (Signed)
Left message to return call 

## 2016-08-14 NOTE — Telephone Encounter (Signed)
Thomas [name] was notified this is a controlled medicine-we can give 30 tablets currently he needs a medical office visit before we can grant a larger prescription-last time he was seen for medical medicine check was approximately 1 year ago. Thomas [name] verbalized understanding. Script will be faxed today.

## 2016-12-13 ENCOUNTER — Encounter: Payer: Self-pay | Admitting: Family Medicine

## 2016-12-13 ENCOUNTER — Ambulatory Visit (INDEPENDENT_AMBULATORY_CARE_PROVIDER_SITE_OTHER): Payer: PPO | Admitting: Family Medicine

## 2016-12-13 VITALS — BP 128/78 | Ht 66.0 in | Wt 186.0 lb

## 2016-12-13 DIAGNOSIS — R0600 Dyspnea, unspecified: Secondary | ICD-10-CM

## 2016-12-13 DIAGNOSIS — K649 Unspecified hemorrhoids: Secondary | ICD-10-CM

## 2016-12-13 DIAGNOSIS — D649 Anemia, unspecified: Secondary | ICD-10-CM

## 2016-12-13 DIAGNOSIS — G4709 Other insomnia: Secondary | ICD-10-CM | POA: Diagnosis not present

## 2016-12-13 DIAGNOSIS — Z79899 Other long term (current) drug therapy: Secondary | ICD-10-CM | POA: Diagnosis not present

## 2016-12-13 DIAGNOSIS — K625 Hemorrhage of anus and rectum: Secondary | ICD-10-CM

## 2016-12-13 DIAGNOSIS — E784 Other hyperlipidemia: Secondary | ICD-10-CM | POA: Diagnosis not present

## 2016-12-13 DIAGNOSIS — E7849 Other hyperlipidemia: Secondary | ICD-10-CM

## 2016-12-13 MED ORDER — ALPRAZOLAM 1 MG PO TABS: ORAL_TABLET | ORAL | 5 refills | Status: DC

## 2016-12-13 NOTE — Progress Notes (Signed)
   Subjective:    Patient ID: Thomas [name], male    DOB: Jul 30, 1945, 71 y.o.   MRN: [id number]  HPI  Patient is here today for hemorrhoids- been off and on for years Has hx of hernia Swollen painful Now using med it is helping BM varies formed not hard occas blood from the hemorrhoid Cream helps  .Has some shortness of breath at times.usually does ok but some wheeze Uses inhaler prnPatient denies any chest tightness pressure pain with activity. Does get a little short winded when he walks did smoke in the past. Uses an inhaler intermittently. Patient denies coughing up any blood.  Patient has intermittent anxiety related issues at nighttime difficult time resting the go to sleep so therefore uses Xanax denies abusing it  Patient with hyperlipidemia takes red rice C6 extract watch his diet previous labs reviewed [place]  Patient states occasionally blood in stools that's related to the hemorrhoids we will go ahead and check a stool test for this.  Needs a rx for Alprazolam. Review of Systems  Constitutional: Negative for activity change, appetite change and fatigue.  HENT: Negative for congestion.   Respiratory: Negative for cough.   Cardiovascular: Negative for chest pain.  Gastrointestinal: Negative for abdominal pain.  Endocrine: Negative for polydipsia and polyphagia.  Neurological: Negative for weakness.  Psychiatric/Behavioral: Negative for confusion.       Objective:   Physical Exam  Constitutional: He appears well-nourished. No distress.  Cardiovascular: Normal rate, regular rhythm and normal heart sounds.   No murmur heard. Pulmonary/Chest: Effort normal and breath sounds normal. No respiratory distress.  Abdominal: Soft. He exhibits no distension. There is no tenderness. There is no rebound.  Musculoskeletal: He exhibits no edema.  Lymphadenopathy:    He has no cervical adenopathy.  Neurological: He is alert.  Psychiatric: His behavior is normal.  Vitals  reviewed.  Eardrums normal neck no masses head atraumatic lungs are clear no crackles heart regular mild kyphosis noted chest wall nontender heart regular no murmurs abdomen soft no tenderness extremities no edema hemorrhoids are noted neurologic grossly normal  25 minutes was spent with the patient. Greater than half the time was spent in discussion and answering questions and counseling regarding the issues that the patient came in for today.      Assessment & Plan:  Hemorrhoids-his hemorrhoids are not that bad right now. He states he's going use topical creams. He is going keep his bowel movements soft. If he starts have more trouble we will refer him to general surgery either [place] or [place]  Hyperlipidemia he will continue his red rice yeast extract. He will check a lipid profile previous labs reviewed  Dyspnea with a history of smoking he only notices this when he's very busy her get a little bit short of breath but he denies chest tightness he denies chest pain or discomfort he does cough occasionally he does have a remote smoking history. We will do a chest x-ray. O2 sats ration 99% patient walked 2 laps within our office without any significant dyspnea.  Patient does relate low bit of blood in the stool that he states is related to hemorrhoid we will check a stool tests for blood  Insomnia patient does request refills of his medicine

## 2016-12-16 DIAGNOSIS — K625 Hemorrhage of anus and rectum: Secondary | ICD-10-CM | POA: Diagnosis not present

## 2016-12-16 DIAGNOSIS — Z79899 Other long term (current) drug therapy: Secondary | ICD-10-CM | POA: Diagnosis not present

## 2016-12-16 DIAGNOSIS — D649 Anemia, unspecified: Secondary | ICD-10-CM | POA: Diagnosis not present

## 2016-12-16 DIAGNOSIS — R0600 Dyspnea, unspecified: Secondary | ICD-10-CM | POA: Diagnosis not present

## 2016-12-16 DIAGNOSIS — E784 Other hyperlipidemia: Secondary | ICD-10-CM | POA: Diagnosis not present

## 2016-12-17 ENCOUNTER — Other Ambulatory Visit: Payer: Self-pay | Admitting: *Deleted

## 2016-12-17 DIAGNOSIS — K625 Hemorrhage of anus and rectum: Secondary | ICD-10-CM

## 2016-12-17 LAB — BASIC METABOLIC PANEL
BUN/Creatinine Ratio: 11 (ref 10–24)
BUN: 11 mg/dL (ref 8–27)
CALCIUM: 9.4 mg/dL (ref 8.6–10.2)
CHLORIDE: 103 mmol/L (ref 96–106)
CO2: 23 mmol/L (ref 20–29)
Creatinine, Ser: 1.04 mg/dL (ref 0.76–1.27)
GFR calc Af Amer: 83 mL/min/{1.73_m2} (ref >59)
GFR calc non Af Amer: 71 mL/min/{1.73_m2} (ref >59)
GLUCOSE: 92 mg/dL (ref 65–99)
POTASSIUM: 4.6 mmol/L (ref 3.5–5.2)
Sodium: 140 mmol/L (ref 134–144)

## 2016-12-17 LAB — CBC WITH DIFFERENTIAL/PLATELET
BASOS: 1 %
Basophils Absolute: 0 10*3/uL (ref 0.0–0.2)
EOS (ABSOLUTE): 0.4 10*3/uL (ref 0.0–0.4)
EOS: 7 %
HEMATOCRIT: 36.4 % — AB (ref 37.5–51.0)
HEMOGLOBIN: 11.3 g/dL — AB (ref 13.0–17.7)
IMMATURE GRANS (ABS): 0 10*3/uL (ref 0.0–0.1)
IMMATURE GRANULOCYTES: 0 %
LYMPHS ABS: 1.4 10*3/uL (ref 0.7–3.1)
Lymphs: 23 %
MCH: 27 pg (ref 26.6–33.0)
MCHC: 31 g/dL — AB (ref 31.5–35.7)
MCV: 87 fL (ref 79–97)
MONOCYTES: 12 %
Monocytes Absolute: 0.7 10*3/uL (ref 0.1–0.9)
NEUTROS PCT: 57 %
Neutrophils Absolute: 3.4 10*3/uL (ref 1.4–7.0)
Platelets: 282 10*3/uL (ref 150–379)
RBC: 4.19 x10E6/uL (ref 4.14–5.80)
RDW: 14 % (ref 12.3–15.4)
WBC: 6 10*3/uL (ref 3.4–10.8)

## 2016-12-17 LAB — HEPATIC FUNCTION PANEL
ALT: 17 IU/L (ref 0–44)
AST: 17 IU/L (ref 0–40)
Albumin: 4.4 g/dL (ref 3.5–4.8)
Alkaline Phosphatase: 72 IU/L (ref 39–117)
Bilirubin Total: 0.3 mg/dL (ref 0.0–1.2)
Bilirubin, Direct: 0.07 mg/dL (ref 0.00–0.40)
Total Protein: 6.7 g/dL (ref 6.0–8.5)

## 2016-12-17 LAB — LIPID PANEL
CHOL/HDL RATIO: 5.5 ratio — AB (ref 0.0–5.0)
Cholesterol, Total: 242 mg/dL — ABNORMAL HIGH (ref 100–199)
HDL: 44 mg/dL (ref >39)
LDL CALC: 165 mg/dL — AB (ref 0–99)
TRIGLYCERIDES: 166 mg/dL — AB (ref 0–149)
VLDL CHOLESTEROL CAL: 33 mg/dL (ref 5–40)

## 2016-12-17 LAB — IFOBT (OCCULT BLOOD): IMMUNOLOGICAL FECAL OCCULT BLOOD TEST: NEGATIVE

## 2016-12-18 LAB — SPECIMEN STATUS REPORT

## 2016-12-24 LAB — IRON AND TIBC
IRON: 29 ug/dL — AB (ref 38–169)
Iron Saturation: 7 % — CL (ref 15–55)
TIBC: 413 ug/dL (ref 250–450)
UIBC: 384 ug/dL — AB (ref 111–343)

## 2016-12-24 LAB — SPECIMEN STATUS REPORT

## 2016-12-24 LAB — FERRITIN: FERRITIN: 8 ng/mL — AB (ref 30–400)

## 2016-12-25 ENCOUNTER — Encounter: Payer: Self-pay | Admitting: Family Medicine

## 2016-12-25 ENCOUNTER — Telehealth: Payer: Self-pay | Admitting: Family Medicine

## 2016-12-25 NOTE — Telephone Encounter (Signed)
This patient had iron deficient anemia. He states he had bleeding from his hemorrhoids and states he is no longer bleeding. He refused consultation with gastroenterology. I sent him a letter recommending that he will repeat his lab work and stool study in 3 months time for a follow-up.

## 2016-12-25 NOTE — Addendum Note (Signed)
Addended by: Dairl Ponder on: 12/25/2016 11:26 AM   Modules accepted: Orders

## 2016-12-27 ENCOUNTER — Telehealth: Payer: Self-pay | Admitting: Family Medicine

## 2016-12-27 DIAGNOSIS — D509 Iron deficiency anemia, unspecified: Secondary | ICD-10-CM

## 2016-12-27 NOTE — Telephone Encounter (Signed)
Pt came by and wants Dr. Nicki Reaper to know that he wants to hold off on the specialist visit due to financial reasons and not feeling like he really needs to go. Pt is wanting to try to take iron and build his blood up. Pt states if Dr. Nicki Reaper wants to talk to him for him to call.

## 2016-12-29 NOTE — Telephone Encounter (Signed)
Please let the patient know that it would be mandatory for him to do follow-up lab work in approximately 2-3 months CBC, ferritin any stool study iFBOT  with a follow-up office visit at that time should he change his mind regarding subspecialists appointment please let us know

## 2016-12-30 NOTE — Telephone Encounter (Signed)
I called and left a vm asked that they r/c.

## 2017-01-07 NOTE — Addendum Note (Signed)
Addended by: Launa Grill on: 01/07/2017 08:44 AM   Modules accepted: Orders

## 2017-01-07 NOTE — Telephone Encounter (Signed)
Spoke with patient and informed him per Dr.Scott [name]- would be mandatory for you to do follow-up lab work in approximately 2-3 months CBC, ferritin any stool study iFBOT  with a follow-up office visit at that time should you change your mind regarding subspecialists appointment let us know. Patient verbalized understanding.

## 2017-02-03 ENCOUNTER — Encounter: Payer: PPO | Admitting: Family Medicine

## 2017-02-14 ENCOUNTER — Encounter: Payer: Self-pay | Admitting: Family Medicine

## 2017-02-14 ENCOUNTER — Ambulatory Visit (INDEPENDENT_AMBULATORY_CARE_PROVIDER_SITE_OTHER): Payer: PPO | Admitting: Family Medicine

## 2017-02-14 VITALS — BP 114/68 | Temp 98.7°F | Ht 66.0 in | Wt 186.0 lb

## 2017-02-14 DIAGNOSIS — J209 Acute bronchitis, unspecified: Secondary | ICD-10-CM

## 2017-02-14 DIAGNOSIS — J0110 Acute frontal sinusitis, unspecified: Secondary | ICD-10-CM

## 2017-02-14 MED ORDER — AZITHROMYCIN 250 MG PO TABS: ORAL_TABLET | ORAL | 0 refills | Status: DC

## 2017-02-14 MED ORDER — PREDNISONE 20 MG PO TABS: ORAL_TABLET | ORAL | 0 refills | Status: DC

## 2017-02-14 NOTE — Progress Notes (Signed)
   Subjective:    Patient ID: Thomas [name], male    DOB: 05/02/1945, 71 y.o.   MRN: [id number]  Cough  This is a recurrent problem. Episode onset: off and on for several years. started back 3 days ago. Associated symptoms include headaches, rhinorrhea and a sore throat. Pertinent negatives include no chest pain, ear pain, fever or wheezing. Associated symptoms comments: congestion. Treatments tried: dayquil, nyquil, cough drops.   Patient with significant head drainage coughing chest congestion some wheezing denies vomiting or diarrhea currently no high fever chills   Review of Systems  Constitutional: Negative for activity change and fever.  HENT: Positive for congestion, rhinorrhea and sore throat. Negative for ear pain.   Eyes: Negative for discharge.  Respiratory: Positive for cough. Negative for wheezing.   Cardiovascular: Negative for chest pain.  Neurological: Positive for headaches.       Objective:   Physical Exam  Constitutional: He appears well-developed.  HENT:  Head: Normocephalic.  Mouth/Throat: Oropharynx is clear and moist. No oropharyngeal exudate.  Neck: Normal range of motion.  Cardiovascular: Normal rate, regular rhythm and normal heart sounds.   No murmur heard. Pulmonary/Chest: Effort normal and breath sounds normal. He has no wheezes.  Lymphadenopathy:    He has no cervical adenopathy.  Neurological: He exhibits normal muscle tone.  Skin: Skin is warm and dry.  Nursing note and vitals reviewed.         Assessment & Plan:  Viral URI Acute rhinosinusitis Acute bronchitis Antibiotics Prednisone given just in case to get filled if wheezing issues occur Follow-up if problems

## 2017-02-19 ENCOUNTER — Telehealth: Payer: Self-pay | Admitting: Family Medicine

## 2017-02-19 MED ORDER — DOXYCYCLINE HYCLATE 100 MG PO TABS: 100.0000 mg | ORAL_TABLET | Freq: Two times a day (BID) | ORAL | 0 refills | Status: DC

## 2017-02-19 NOTE — Telephone Encounter (Signed)
Consult with Dr Nicki Reaper- May send in Doxy 100 one tab BID with glass of liquids and snack for 10 days. Prescription sent electronically to pharmacy. Patient notified.

## 2017-02-19 NOTE — Telephone Encounter (Signed)
Pt was seen Fri. 10/26 for sinusitis and was given a zpac. Pt states that he took his last one today and is not better. He states that he feels a little better but is not fully recovered. Pt is requesting something else to be called in. Please advise.   [place]

## 2017-02-19 NOTE — Telephone Encounter (Signed)
Left message to return call 

## 2017-04-07 ENCOUNTER — Ambulatory Visit: Payer: PPO | Admitting: Family Medicine

## 2017-04-09 ENCOUNTER — Other Ambulatory Visit: Payer: Self-pay | Admitting: *Deleted

## 2017-04-09 DIAGNOSIS — H5203 Hypermetropia, bilateral: Secondary | ICD-10-CM | POA: Diagnosis not present

## 2017-04-09 DIAGNOSIS — C61 Malignant neoplasm of prostate: Secondary | ICD-10-CM

## 2017-04-09 DIAGNOSIS — H524 Presbyopia: Secondary | ICD-10-CM | POA: Diagnosis not present

## 2017-04-25 ENCOUNTER — Ambulatory Visit: Payer: PPO | Admitting: Family Medicine

## 2017-05-02 ENCOUNTER — Ambulatory Visit: Payer: PPO | Admitting: Family Medicine

## 2017-05-12 ENCOUNTER — Ambulatory Visit (INDEPENDENT_AMBULATORY_CARE_PROVIDER_SITE_OTHER): Payer: PPO | Admitting: Family Medicine

## 2017-05-12 ENCOUNTER — Encounter: Payer: Self-pay | Admitting: Family Medicine

## 2017-05-12 VITALS — BP 124/78 | Ht 66.0 in | Wt 183.0 lb

## 2017-05-12 DIAGNOSIS — E611 Iron deficiency: Secondary | ICD-10-CM

## 2017-05-12 LAB — POCT HEMOGLOBIN: Hemoglobin: 15.7 g/dL (ref 14.1–18.1)

## 2017-05-12 MED ORDER — ALPRAZOLAM 1 MG PO TABS: ORAL_TABLET | ORAL | 5 refills | Status: DC

## 2017-05-12 MED ORDER — ALBUTEROL SULFATE HFA 108 (90 BASE) MCG/ACT IN AERS: 2.0000 | INHALATION_SPRAY | Freq: Four times a day (QID) | RESPIRATORY_TRACT | 6 refills | Status: DC | PRN

## 2017-05-12 NOTE — Progress Notes (Signed)
   Subjective:    Patient ID: Thomas [name], male    DOB: 1945-09-14, 72 y.o.   MRN: [id number]  HPI Patient is here today to follow up on Low iron. Results for orders placed or performed in visit on 05/12/17  POCT hemoglobin  Result Value Ref Range   Hemoglobin 15.7 14.1 - 18.1 g/dL   This patient states he was having hemorrhoid issues that was causing a lot of bleeding this hemorrhoid issue is gotten much better he had his colonoscopy back in 2015 did not show any tumors and he did have a fecal Hemoccult test which was negative for blood Denies any chest tightness pressure pain shortness of breath Does have mild insomnia uses Xanax intermittently only at nighttime Review of Systems    Denies chest tightness pressure pain shortness breath nausea vomiting diarrhea no no fever Objective:   Physical Exam  HEENT benign neck no masses lungs are clear no crackles heart is regular pulse normal extremities no edema      Assessment & Plan:  Anemia-it is now doing much type other intervention Up-to-date on colonoscopy Refills given for medication for insomnia Follow-up in the summer comprehensive lab work at that time

## 2017-05-14 ENCOUNTER — Inpatient Hospital Stay: Payer: PPO | Attending: Radiation Oncology

## 2017-05-14 DIAGNOSIS — C61 Malignant neoplasm of prostate: Secondary | ICD-10-CM | POA: Diagnosis not present

## 2017-05-14 LAB — PSA: Prostatic Specific Antigen: 2.98 ng/mL (ref 0.00–4.00)

## 2017-05-21 ENCOUNTER — Encounter: Payer: Self-pay | Admitting: Radiation Oncology

## 2017-05-21 ENCOUNTER — Other Ambulatory Visit: Payer: Self-pay

## 2017-05-21 ENCOUNTER — Ambulatory Visit
Admission: RE | Admit: 2017-05-21 | Discharge: 2017-05-21 | Disposition: A | Discharge status: Home / Self Care | Payer: PPO | Source: Ambulatory Visit | Attending: Radiation Oncology | Admitting: Radiation Oncology

## 2017-05-21 VITALS — BP 140/74 | HR 99 | Temp 96.1°F | Resp 20 | Wt 186.1 lb

## 2017-05-21 DIAGNOSIS — Z923 Personal history of irradiation: Secondary | ICD-10-CM | POA: Diagnosis not present

## 2017-05-21 DIAGNOSIS — C61 Malignant neoplasm of prostate: Secondary | ICD-10-CM

## 2017-05-21 NOTE — Progress Notes (Signed)
Radiation Oncology Follow up Note  Name: EMONI YANG   Date:   05/21/2017 MRN:  [id number] DOB: 06-27-1945    This 72 y.o. male presents to the clinic today for for your follow-up status post I-125 interstitialImplant.  REFERRING PROVIDER: [name], MD  HPI: patient is a 72 year old male now out4 years having completed I-125 interstitial implant for Gleason 6 (3+3) adenocarcinoma. He presented with a PSA of 8.7. His most recent PSA is2.98 up slightly from 1 year prior at 2.4. He is asymptomatic specifically denies any increased lower urinary tract symptoms diarrhea.  COMPLICATIONS OF TREATMENT: none  FOLLOW UP COMPLIANCE: keeps appointments   PHYSICAL EXAM:  BP 140/74   Pulse 99   Temp (!) 96.1 F (35.6 C)   Resp 20   Wt 186 lb 1.1 oz (84.4 kg)   BMI 30.03 kg/m  On rectal exam rectal sphincter tone is good. Prostate is smooth contracted without evidence of nodularity or mass. Sulcus is preserved bilaterally. No discrete nodularity is identified. No other rectal abnormalities are noted. Well-developed well-nourished patient in NAD. HEENT reveals PERLA, EOMI, discs not visualized.  Oral cavity is clear. No oral mucosal lesions are identified. Neck is clear without evidence of cervical or supraclavicular adenopathy. Lungs are clear to A&P. Cardiac examination is essentially unremarkable with regular rate and rhythm without murmur rub or thrill. Abdomen is benign with no organomegaly or masses noted. Motor sensory and DTR levels are equal and symmetric in the upper and lower extremities. Cranial nerves II through XII are grossly intact. Proprioception is intact. No peripheral adenopathy or edema is identified. No motor or sensory levels are noted. Crude visual fields are within normal range.  RADIOLOGY RESULTS: no current films for review  PLAN: present time patient is doing well slightly concerned about his state of his PSA although it appears fairly stable over one year. I  will see him back in 1 year and repeat his PSA at that time.Will make a decision at at that time whether to go ahead with pulse Lupron therapy Patient is otherwise doing well.  I would like to take this opportunity to thank you for allowing me to participate in the care of your patient.Noreene Filbert, MD

## 2017-10-01 ENCOUNTER — Other Ambulatory Visit: Payer: Self-pay | Admitting: Family Medicine

## 2017-11-10 ENCOUNTER — Ambulatory Visit: Payer: PPO | Admitting: Family Medicine

## 2017-12-29 ENCOUNTER — Other Ambulatory Visit: Payer: Self-pay | Admitting: Family Medicine

## 2017-12-29 NOTE — Telephone Encounter (Signed)
1 refill only needs follow-up office visit this fall

## 2017-12-31 ENCOUNTER — Encounter: Payer: Self-pay | Admitting: Family Medicine

## 2017-12-31 ENCOUNTER — Ambulatory Visit (INDEPENDENT_AMBULATORY_CARE_PROVIDER_SITE_OTHER): Payer: PPO | Admitting: Family Medicine

## 2017-12-31 VITALS — BP 110/76 | Temp 98.6°F | Ht 66.0 in | Wt 181.0 lb

## 2017-12-31 DIAGNOSIS — F439 Reaction to severe stress, unspecified: Secondary | ICD-10-CM | POA: Diagnosis not present

## 2017-12-31 DIAGNOSIS — H532 Diplopia: Secondary | ICD-10-CM | POA: Diagnosis not present

## 2017-12-31 DIAGNOSIS — E7849 Other hyperlipidemia: Secondary | ICD-10-CM

## 2017-12-31 DIAGNOSIS — G4709 Other insomnia: Secondary | ICD-10-CM | POA: Diagnosis not present

## 2017-12-31 MED ORDER — ALBUTEROL SULFATE HFA 108 (90 BASE) MCG/ACT IN AERS: 2.0000 | INHALATION_SPRAY | Freq: Four times a day (QID) | RESPIRATORY_TRACT | 6 refills | Status: DC | PRN

## 2017-12-31 MED ORDER — ALPRAZOLAM 1 MG PO TABS: ORAL_TABLET | ORAL | 5 refills | Status: DC

## 2017-12-31 NOTE — Progress Notes (Signed)
   Subjective:    Patient ID: Thomas [name], male    DOB: [birth year], 72 y.o.   [name]  [place] his eyes crossed for about 30 seconds. Had to cover one eye to see til he could get pulled over. Pt states he wonders if it was stress because he has been under a lot of stress lately and not sleeping well at night. Has been out of xanax for a few days. Tried to get refilled at pharm.  Patient states he had shortness plan of double vision When he covered one eye his vision was normal when he covered the other eye it was normal as well but nonetheless the double vision bothers him which lasted for approximately 30 to 40 seconds then resolved Also requesting a refill on albuterol inhaler.  Patient states his breathing overall doing relatively well currently. Patient without headaches No facial or scalp tenderness Review of Systems  Constitutional: Negative for activity change.  HENT: Negative for congestion and rhinorrhea.   Respiratory: Negative for cough and shortness of breath.   Cardiovascular: Negative for chest pain.  Gastrointestinal: Negative for abdominal pain, diarrhea, nausea and vomiting.  Genitourinary: Negative for dysuria and hematuria.  Neurological: Negative for weakness and headaches.  Psychiatric/Behavioral: Negative for behavioral problems and confusion.       Objective:   Physical Exam  Constitutional: He appears well-nourished. No distress.  HENT:  Head: Normocephalic and atraumatic.  Eyes: Right eye exhibits no discharge. Left eye exhibits no discharge.  Neck: No tracheal deviation present.  Cardiovascular: Normal rate, regular rhythm and normal heart sounds.  No murmur heard. Pulmonary/Chest: Effort normal and breath sounds normal. No respiratory distress.  Musculoskeletal: He exhibits no edema.  Lymphadenopathy:    He has no cervical adenopathy.  Neurological: He is alert. Coordination normal.  Skin: Skin is warm  and dry.  Psychiatric: He has a normal mood and affect. His behavior is normal.  Vitals reviewed.  Neurological checks out okay no sign of double vision no sign of cough side.  EOMI.  Pupils responsive to light.  There is no unilateral weakness.  Finger-to-nose is normal.  Romberg negative.  No ataxia.  No nystagmus.     Assessment & Plan:  Double vision this is now resolved.  He has a totally normal exam.  Symptoms only lasted 30 seconds.  I am hesitant to him through a full work-up given a normal exam currently. If any reoccurrence follow-up immediately  Insomnia uses Xanax this was given refills  Recommend 81 mg aspirin daily.  For the next month

## 2017-12-31 NOTE — Patient Instructions (Signed)
Please do 81 mg aspirin one daily for the next 30 days

## 2018-01-04 ENCOUNTER — Telehealth: Payer: Self-pay | Admitting: Family Medicine

## 2018-01-04 NOTE — Telephone Encounter (Signed)
Please let her that I did communicate with neurology regarding his episode he had the other day of his vision being cross eyed  Neurologist states unless this recurs again there is no testing necessary.  Should it recur again he needs to let us know and we would help set him up with neurology

## 2018-01-05 NOTE — Telephone Encounter (Signed)
Patient was advised that Dr Nicki Reaper did communicate with neurology regarding his episode he had the other day of his vision being cross eyed. Neurologist states unless this recurs again there is no testing necessary.  Should it recur again he needs to let us know and we would help set him up with neurology. Patient verbalized understanding.

## 2018-04-08 ENCOUNTER — Encounter: Payer: Self-pay | Admitting: Family Medicine

## 2018-04-08 ENCOUNTER — Ambulatory Visit (INDEPENDENT_AMBULATORY_CARE_PROVIDER_SITE_OTHER): Payer: PPO | Admitting: Family Medicine

## 2018-04-08 VITALS — BP 118/76 | Temp 98.2°F | Ht 66.0 in | Wt 185.8 lb

## 2018-04-08 DIAGNOSIS — J019 Acute sinusitis, unspecified: Secondary | ICD-10-CM | POA: Diagnosis not present

## 2018-04-08 DIAGNOSIS — J111 Influenza due to unidentified influenza virus with other respiratory manifestations: Secondary | ICD-10-CM | POA: Diagnosis not present

## 2018-04-08 MED ORDER — OSELTAMIVIR PHOSPHATE 75 MG PO CAPS: 75.0000 mg | ORAL_CAPSULE | Freq: Two times a day (BID) | ORAL | 0 refills | Status: DC

## 2018-04-08 MED ORDER — AZITHROMYCIN 250 MG PO TABS: ORAL_TABLET | ORAL | 0 refills | Status: DC

## 2018-04-08 NOTE — Progress Notes (Signed)
   Subjective:    Patient ID: Thomas [name], male    DOB: 17-Mar-1946, 72 y.o.   MRN: [id number]  Cough  This is a new problem. The current episode started in the past 7 days. Associated symptoms include headaches, myalgias, nasal congestion, rhinorrhea and a sore throat. Pertinent negatives include no chest pain, chills, ear pain, fever or wheezing. Treatments tried: mucinex.   Yesterday cough Then felt bad [place],cough , sore throat, headache,no fever  Had chills Coughing up phlegm No n or v  Review of Systems  Constitutional: Negative for activity change, chills and fever.  HENT: Positive for congestion, rhinorrhea and sore throat. Negative for ear pain.   Eyes: Negative for discharge.  Respiratory: Positive for cough. Negative for wheezing.   Cardiovascular: Negative for chest pain.  Gastrointestinal: Negative for nausea and vomiting.  Musculoskeletal: Positive for myalgias. Negative for arthralgias.  Neurological: Positive for headaches.       Objective:   Physical Exam Vitals signs and nursing note reviewed.  Constitutional:      Appearance: He is well-developed.  HENT:     Head: Normocephalic.     Mouth/Throat:     Pharynx: No oropharyngeal exudate.  Neck:     Musculoskeletal: Normal range of motion.  Cardiovascular:     Rate and Rhythm: Normal rate and regular rhythm.     Heart sounds: Normal heart sounds. No murmur.  Pulmonary:     Effort: Pulmonary effort is normal.     Breath sounds: Normal breath sounds. No wheezing.  Lymphadenopathy:     Cervical: No cervical adenopathy.  Skin:    General: Skin is warm and dry.  Neurological:     Motor: No abnormal muscle tone.           Assessment & Plan:  Influenza-the patient was diagnosed with influenza. Patient/family educated about the flu and warning signs to watch for. If difficulty breathing,  cyanosis, disorientation, or progressive worsening then immediately get rechecked at the ER. If progressive  symptoms be certain to be rechecked. Supportive measures such as Tylenol/ibuprofen was discussed. No aspirin use in children.  Family member with flu also  Also probable secondary sinus infection I do not hear any pneumonia we will cover with an antibiotic warning signs were discussed

## 2018-04-08 NOTE — Patient Instructions (Signed)
Rest up this week We will send in antibiotic to help with sinus infection as a result of the flu     Influenza, Adult Influenza is also called "the flu." It is an infection in the lungs, nose, and throat (respiratory tract). It is caused by a virus. The flu causes symptoms that are similar to symptoms of a cold. It also causes a high fever and body aches. The flu spreads easily from person to person (is contagious). Getting a flu shot (influenza vaccination) every year is the best way to prevent the flu. What are the causes? This condition is caused by the influenza virus. You can get the virus by:  Breathing in droplets that are in the air from the cough or sneeze of a person who has the virus.  Touching something that has the virus on it (is contaminated) and then touching your mouth, nose, or eyes. What increases the risk? Certain things may make you more likely to get the flu. These include:  Not washing your hands often.  Having close contact with many people during cold and flu season.  Touching your mouth, eyes, or nose without first washing your hands.  Not getting a flu shot every year. You may have a higher risk for the flu, along with serious problems such as a lung infection (pneumonia), if you:  Are older than 65.  Are pregnant.  Have a weakened disease-fighting system (immune system) because of a disease or taking certain medicines.  Have a long-term (chronic) illness, such as: ? Heart, kidney, or lung disease. ? Diabetes. ? Asthma.  Have a liver disorder.  Are very overweight (morbidly obese).  Have anemia. This is a condition that affects your red blood cells. What are the signs or symptoms? Symptoms usually begin suddenly and last 4-14 days. They may include:  Fever and chills.  Headaches, body aches, or muscle aches.  Sore throat.  Cough.  Runny or stuffy (congested) nose.  Chest discomfort.  Not wanting to eat as much as normal (poor  appetite).  Weakness or feeling tired (fatigue).  Dizziness.  Feeling sick to your stomach (nauseous) or throwing up (vomiting). How is this treated? If the flu is found early, you can be treated with medicine that can help reduce how bad the illness is and how long it lasts (antiviral medicine). This may be given by mouth (orally) or through an IV tube. Taking care of yourself at home can help your symptoms get better. Your doctor may suggest:  Taking over-the-counter medicines.  Drinking plenty of fluids. The flu often goes away on its own. If you have very bad symptoms or other problems, you may be treated in a hospital. Follow these instructions at home:     Activity  Rest as needed. Get plenty of sleep.  Stay home from work or school as told by your doctor. ? Do not leave home until you do not have a fever for 24 hours without taking medicine. ? Leave home only to visit your doctor. Eating and drinking  Take an ORS (oral rehydration solution). This is a drink that is sold at pharmacies and stores.  Drink enough fluid to keep your pee (urine) pale yellow.  Drink clear fluids in small amounts as you are able. Clear fluids include: ? Water. ? Ice chips. ? Fruit juice that has water added (diluted fruit juice). ? Low-calorie sports drinks.  Eat bland, easy-to-digest foods in small amounts as you are able. These foods include: ?  Bananas. ? Applesauce. ? Rice. ? Lean meats. ? Toast. ? Crackers.  Do not eat or drink: ? Fluids that have a lot of sugar or caffeine. ? Alcohol. ? Spicy or fatty foods. General instructions  Take over-the-counter and prescription medicines only as told by your doctor.  Use a cool mist humidifier to add moisture to the air in your home. This can make it easier for you to breathe.  Cover your mouth and nose when you cough or sneeze.  Wash your hands with soap and water often, especially after you cough or sneeze. If you cannot use soap  and water, use alcohol-based hand sanitizer.  Keep all follow-up visits as told by your doctor. This is important. How is this prevented?   Get a flu shot every year. You may get the flu shot in late summer, fall, or winter. Ask your doctor when you should get your flu shot.  Avoid contact with people who are sick during fall and winter (cold and flu season). Contact a doctor if:  You get new symptoms.  You have: ? Chest pain. ? Watery poop (diarrhea). ? A fever.  Your cough gets worse.  You start to have more mucus.  You feel sick to your stomach.  You throw up. Get help right away if you:  Have shortness of breath.  Have trouble breathing.  Have skin or nails that turn a bluish color.  Have very bad pain or stiffness in your neck.  Get a sudden headache.  Get sudden pain in your face or ear.  Cannot eat or drink without throwing up. Summary  Influenza ("the flu") is an infection in the lungs, nose, and throat. It is caused by a virus.  Take over-the-counter and prescription medicines only as told by your doctor.  Getting a flu shot every year is the best way to avoid getting the flu. This information is not intended to replace advice given to you by your health care provider. Make sure you discuss any questions you have with your health care provider. Document Released: 01/16/2008 Document Revised: 09/24/2017 Document Reviewed: 09/24/2017 Elsevier Interactive Patient Education  2019 [employer].

## 2018-04-20 ENCOUNTER — Telehealth: Payer: Self-pay

## 2018-04-20 NOTE — Telephone Encounter (Signed)
Patient called stating he is having nausea and severe right upper Quad pain that comes and goes. I advised that he go to the ed to have this evaluated. Patient states understanding.

## 2018-05-27 ENCOUNTER — Other Ambulatory Visit: Payer: PPO

## 2018-06-03 ENCOUNTER — Ambulatory Visit: Payer: PPO | Admitting: Radiation Oncology

## 2018-06-10 ENCOUNTER — Inpatient Hospital Stay: Payer: Medicare HMO

## 2018-06-11 ENCOUNTER — Inpatient Hospital Stay: Payer: Medicare HMO | Attending: Radiation Oncology

## 2018-06-11 ENCOUNTER — Other Ambulatory Visit: Payer: Self-pay | Admitting: *Deleted

## 2018-06-11 DIAGNOSIS — C61 Malignant neoplasm of prostate: Secondary | ICD-10-CM | POA: Insufficient documentation

## 2018-06-11 LAB — PSA: Prostatic Specific Antigen: 3.14 ng/mL (ref 0.00–4.00)

## 2018-06-15 ENCOUNTER — Ambulatory Visit
Admission: RE | Admit: 2018-06-15 | Discharge: 2018-06-15 | Disposition: A | Discharge status: Home / Self Care | Payer: Medicare HMO | Source: Ambulatory Visit | Attending: Radiation Oncology | Admitting: Radiation Oncology

## 2018-06-15 ENCOUNTER — Other Ambulatory Visit: Payer: Self-pay

## 2018-06-15 ENCOUNTER — Encounter: Payer: Self-pay | Admitting: Radiation Oncology

## 2018-06-15 VITALS — BP 138/90 | HR 93 | Temp 94.3°F | Resp 18 | Wt 186.5 lb

## 2018-06-15 DIAGNOSIS — C61 Malignant neoplasm of prostate: Secondary | ICD-10-CM | POA: Diagnosis not present

## 2018-06-15 DIAGNOSIS — Z923 Personal history of irradiation: Secondary | ICD-10-CM | POA: Insufficient documentation

## 2018-06-15 NOTE — Progress Notes (Signed)
Radiation Oncology Follow up Note  Name: Thomas [name]   Date:   06/15/2018 MRN:  [id number] DOB: 1945/07/07    This 73 y.o. male presents to the clinic today for 5 year follow-up status post I-125 interstitial implant for Gleason 6 adenocarcinoma the prostate.  REFERRING PROVIDER: [name], MD  HPI: patient is a 73 year old male now out 5 years having completed I-125 interstitial implant for Gleason 6 (3+3) adenocarcinoma the prostate presenting the PSA of 8.7. He is seen today in routine follow-up and is doing well. He specifically denies diarrhea dysuria or any other GI/GU complaints. His PSA last year was 2.9. We repeated it 1 year later this month it was 3.1..  COMPLICATIONS OF TREATMENT: none  FOLLOW UP COMPLIANCE: keeps appointments   PHYSICAL EXAM:  BP 138/90 (BP Location: Left Arm, Patient Position: Sitting)   Pulse 93   Temp (!) 94.3 F (34.6 C) (Tympanic)   Resp 18   Wt 186 lb 8.2 oz (84.6 kg)   BMI 30.10 kg/m  Well-developed well-nourished patient in NAD. HEENT reveals PERLA, EOMI, discs not visualized.  Oral cavity is clear. No oral mucosal lesions are identified. Neck is clear without evidence of cervical or supraclavicular adenopathy. Lungs are clear to A&P. Cardiac examination is essentially unremarkable with regular rate and rhythm without murmur rub or thrill. Abdomen is benign with no organomegaly or masses noted. Motor sensory and DTR levels are equal and symmetric in the upper and lower extremities. Cranial nerves II through XII are grossly intact. Proprioception is intact. No peripheral adenopathy or edema is identified. No motor or sensory levels are noted. Crude visual fields are within normal range.  RADIOLOGY RESULTS: no current films for review  PLAN: present time patient is under biochemical control his prostate cancer with PSA being stable over the past year. I'm please was overall progress. I feel is fine to continue once your follow-up PSA  testing. Patient is comfortable with our decision plan. Follow-up for made.  I would like to take this opportunity to thank you for allowing me to participate in the care of your patient.Noreene Filbert, MD

## 2018-07-21 ENCOUNTER — Other Ambulatory Visit: Payer: Self-pay | Admitting: Family Medicine

## 2018-07-21 ENCOUNTER — Telehealth: Payer: Self-pay | Admitting: Family Medicine

## 2018-07-21 MED ORDER — DIPHENOXYLATE-ATROPINE 2.5-0.025 MG PO TABS: 1.0000 | ORAL_TABLET | Freq: Four times a day (QID) | ORAL | 0 refills | Status: DC | PRN

## 2018-07-21 NOTE — Telephone Encounter (Signed)
Patient notified and verbalized understanding. 

## 2018-07-21 NOTE — Telephone Encounter (Signed)
Pt walked in to office requesting refill on Diphen? Pt states he is having diarrhea.  [id number] - [place],  - [place]

## 2018-07-21 NOTE — Telephone Encounter (Signed)
Please let the patient know that I did in fact go ahead with sending in a prescription for his diarrhea if his diarrhea persists I recommend the patient call us and we can do a virtual visit to help him with that

## 2018-07-21 NOTE — Telephone Encounter (Signed)
Patient wants generic lomotil

## 2018-08-03 ENCOUNTER — Other Ambulatory Visit: Payer: Self-pay | Admitting: Family Medicine

## 2018-08-03 NOTE — Telephone Encounter (Signed)
Virtual visit 

## 2018-08-03 NOTE — Telephone Encounter (Signed)
Called pt and scheduled office visit for tomorrow.

## 2018-08-04 ENCOUNTER — Ambulatory Visit (INDEPENDENT_AMBULATORY_CARE_PROVIDER_SITE_OTHER): Payer: Medicare HMO | Admitting: Family Medicine

## 2018-08-04 ENCOUNTER — Encounter: Payer: Self-pay | Admitting: Family Medicine

## 2018-08-04 ENCOUNTER — Other Ambulatory Visit: Payer: Self-pay

## 2018-08-04 VITALS — Wt 177.0 lb

## 2018-08-04 DIAGNOSIS — K9049 Malabsorption due to intolerance, not elsewhere classified: Secondary | ICD-10-CM

## 2018-08-04 DIAGNOSIS — R197 Diarrhea, unspecified: Secondary | ICD-10-CM | POA: Diagnosis not present

## 2018-08-04 MED ORDER — DIPHENOXYLATE-ATROPINE 2.5-0.025 MG PO TABS: ORAL_TABLET | ORAL | 3 refills | Status: DC

## 2018-08-04 NOTE — Progress Notes (Signed)
   Subjective:    Patient ID: Thomas [name], male    DOB: Dec 16, 1945, 73 y.o.   MRN: [id number] Video visit-audio and video Coronavirus outbreak  HPI Pt here for medication check. Pt needed refill on medication to help with loose stool. Pt was taking Lomotil as needed. Pt is not going quite as often; maybe 3 or 4 times a day. Pt takes Xanax prn. Pt is not taking Protonix, has not taken Protonix in years.  Patient does relate moods are doing well He is staying at home try to minimize risk of coronavirus Denies being depressed He states he drinks a lot of milk since being at home and he thinks it may have to do a lot with his diarrhea he wonders what he can try He denies any blood or mucus in the stool denies abdominal pain fever sweats chills or weight loss Virtual Visit via Video Note  I connected with Thomas [name] on 08/04/18 at  1:40 PM EDT by a video enabled telemedicine application and verified that I am speaking with the correct person using two identifiers.   I discussed the limitations of evaluation and management by telemedicine and the availability of in person appointments. The patient expressed understanding and agreed to proceed.  History of Present Illness:    Observations/Objective:   Assessment and Plan:   Follow Up Instructions:    I discussed the assessment and treatment plan with the patient. The patient was provided an opportunity to ask questions and all were answered. The patient agreed with the plan and demonstrated an understanding of the instructions.   The patient was advised to call back or seek an in-person evaluation if the symptoms worsen or if the condition fails to improve as anticipated.  I provided 15 minutes of non-face-to-face time during this encounter.   Vicente Males, LPN    Review of Systems     Objective:   Physical Exam   Unable to do physical exam patient overall looks good on video     Assessment & Plan:  It is  highly possible that the diarrhea is related to the increase amount of milk he is using He will reduce the amount of milk 2 glasses a day He will use Lactaid tablets We will recheck again with him in 2 weeks time He may use the Lomotil up to 4 times a day as needed Hopefully things will be going better at that point if not we will need to run some tests

## 2018-08-04 NOTE — Telephone Encounter (Signed)
I sent this and it is a duplicate

## 2018-08-11 ENCOUNTER — Telehealth: Payer: Self-pay | Admitting: Family Medicine

## 2018-08-11 ENCOUNTER — Other Ambulatory Visit: Payer: Self-pay

## 2018-08-11 ENCOUNTER — Ambulatory Visit (INDEPENDENT_AMBULATORY_CARE_PROVIDER_SITE_OTHER): Payer: Medicare HMO | Admitting: Family Medicine

## 2018-08-11 ENCOUNTER — Other Ambulatory Visit: Payer: Self-pay | Admitting: Family Medicine

## 2018-08-11 DIAGNOSIS — R197 Diarrhea, unspecified: Secondary | ICD-10-CM

## 2018-08-11 MED ORDER — METRONIDAZOLE 500 MG PO TABS: 500.0000 mg | ORAL_TABLET | Freq: Three times a day (TID) | ORAL | 0 refills | Status: DC

## 2018-08-11 NOTE — Progress Notes (Signed)
Containers and lab orders printed and placed in lab bags. Flagyl sent in to pharmacy and referral placed.

## 2018-08-11 NOTE — Progress Notes (Signed)
   Subjective:  Telephone only video not available patient at home we were present at the office  Patient ID: Thomas [name], male    DOB: [birth year], 73 y.o.   MRN: [id number]  HPI Mild headache this Dr. Nicki Reaper Patient calls for a follow up on diarrhea. Patient states that the medication he was given last week has not helped on his diarrhea. Patient states he is also taking the lactose and it has not helped either. Patient wonders if he has an infection or inflamation in his intestines. Significant diarrhea issues somewhat watery mucus he wonders about Crohn's disease.  Denies any blood in the stool.  Virtual Visit via Video Note  I connected with Thomas [name] on 08/11/18 at  2:30 PM EDT by a video enabled telemedicine application and verified that I am speaking with the correct person using two identifiers.   I discussed the limitations of evaluation and management by telemedicine and the availability of in person appointments. The patient expressed understanding and agreed to proceed.  History of Present Illness:    Observations/Objective:   Assessment and Plan:   Follow Up Instructions:    I discussed the assessment and treatment plan with the patient. The patient was provided an opportunity to ask questions and all were answered. The patient agreed with the plan and demonstrated an understanding of the instructions.   The patient was advised to call back or seek an in-person evaluation if the symptoms worsen or if the condition fails to improve as anticipated.  I provided 15 minutes of non-face-to-face time during this encounter.       Review of Systems     Objective:   Physical Exam        Assessment & Plan:  Because of his situation I recommend stool studies patient states he will get these done it is possible he may be developing colitis but I doubt that he has Crohn's disease I do not feel he has cancer if this does not get better referral to  gastroenterology stool studies first

## 2018-08-12 ENCOUNTER — Telehealth: Payer: Self-pay | Admitting: *Deleted

## 2018-08-12 NOTE — Telephone Encounter (Signed)
Since the patient is gotten better we do not need to do stool studies.  He can hold off on that- notify us if any problems in the future

## 2018-08-12 NOTE — Progress Notes (Signed)
This was completed yesterday by tanya

## 2018-08-12 NOTE — Telephone Encounter (Signed)
Patient advised per Dr Nicki Reaper: Since the patient is gotten better we do not need to do stool studies.  He can hold off on that- notify us if any problems in the future. Patient verbalized understanding.

## 2018-08-12 NOTE — Telephone Encounter (Signed)
Pt came by to pick up stool containers to do stool studies ordered yesterday and he states since talking to dr scott he has gotten better. No more diarrhea. Stool this morning back to normal. Wants to know if he still needs to do stool studies. Containers and orders still given to pt and was told to hold off on taking to lab until we discussed with dr scott if he wanted to do test now or not.

## 2018-08-18 ENCOUNTER — Ambulatory Visit (INDEPENDENT_AMBULATORY_CARE_PROVIDER_SITE_OTHER): Payer: Medicare HMO | Admitting: Family Medicine

## 2018-08-18 ENCOUNTER — Other Ambulatory Visit: Payer: Self-pay

## 2018-08-18 DIAGNOSIS — R197 Diarrhea, unspecified: Secondary | ICD-10-CM

## 2018-08-18 NOTE — Progress Notes (Signed)
   Subjective:    Patient ID: Thomas [name], male    DOB: 1945-12-18, 73 y.o.   MRN: [id number]  HPI Format-phone  Patient present at home Provider present at office Consent for interaction obtained Coronavirus outbreak made virtual visit necessary  15 minutes spent on phone with patient   Patient calls for a follow up on diarrhea. Patient states it is doing a lot better but not cured Patient denies any bloody stools denies mucus in the stools no fever chills no abdominal pain or back pain he states cutting back on milk products have reduced his diarrhea but it still happens 3 times a day he denies fever chills cough wheezing difficulty breathing PMH benign Review of Systems     Objective:   Physical Exam        Assessment & Plan:  The patient is using Lactaid currently He is minimizing milk products is much as possible He will go ahead and do his stool studies If he is still having ongoing loose stools multiple times a day despite negative stool studies the next step would be gastroenterology consultation

## 2018-08-19 ENCOUNTER — Telehealth: Payer: Self-pay | Admitting: Family Medicine

## 2018-08-19 DIAGNOSIS — R197 Diarrhea, unspecified: Secondary | ICD-10-CM | POA: Diagnosis not present

## 2018-08-19 NOTE — Telephone Encounter (Signed)
Good-we will be watching out for the results

## 2018-08-19 NOTE — Telephone Encounter (Signed)
Pt wanted to let Dr. Nicki Reaper know he went to the lab this morning.

## 2018-08-20 ENCOUNTER — Telehealth: Payer: Self-pay | Admitting: Family Medicine

## 2018-08-20 LAB — CLOSTRIDIUM DIFFICILE BY PCR: Toxigenic C. Difficile by PCR: NEGATIVE

## 2018-08-20 NOTE — Telephone Encounter (Signed)
Patient wanting your advise on a call he received from the [facility] clinic yesterday wanting him to set up appointment with them. Pleas Advise.

## 2018-08-20 NOTE — Telephone Encounter (Signed)
Left message to return call 

## 2018-08-21 LAB — OVA AND PARASITE EXAMINATION

## 2018-08-24 LAB — STOOL CULTURE: E coli, Shiga toxin Assay: NEGATIVE

## 2018-08-25 ENCOUNTER — Telehealth: Payer: Self-pay | Admitting: Family Medicine

## 2018-08-25 NOTE — Telephone Encounter (Signed)
Left message to return call 

## 2018-08-25 NOTE — Telephone Encounter (Signed)
Spoke with patient regarding result notes. Pt states he was contacted by GI but did not want to make an appt until he had heard from [place] regarding results.

## 2018-08-25 NOTE — Telephone Encounter (Signed)
Patient wanting results of stool sample he done on 4/30.

## 2018-08-25 NOTE — Telephone Encounter (Signed)
Pt contacted and informed of results. Pt verbalized understanding.

## 2018-08-31 ENCOUNTER — Ambulatory Visit (INDEPENDENT_AMBULATORY_CARE_PROVIDER_SITE_OTHER): Payer: Medicare HMO | Admitting: Family Medicine

## 2018-08-31 ENCOUNTER — Other Ambulatory Visit: Payer: Self-pay

## 2018-08-31 DIAGNOSIS — R197 Diarrhea, unspecified: Secondary | ICD-10-CM

## 2018-08-31 NOTE — Progress Notes (Signed)
   Subjective:  Phone only no video  Patient ID: Thomas [name], male    DOB: [birth year], 73 y.o.   MRN: [id number]  HPI Follow up on diarrhea for the past two months. Pt states stools are better not diarrhea anymore just soft and loose and he has about 3-4 a day now instead of 7-8.  Patient relates frequent loose stools denies bloody stools denies severe abdominal pain we did do stool studies they were negative there is possibility he may be having colitis going on this was discussed with patient Virtual Visit via Telephone Note  I connected with [name] on 08/31/18 at  3:00 PM EDT by telephone and verified that I am speaking with the correct person using two identifiers.  Location: Patient: Home Provider: Office   I discussed the limitations, risks, security and privacy concerns of performing an evaluation and management service by telephone and the availability of in person appointments. I also discussed with the patient that there may be a patient responsible charge related to this service. The patient expressed understanding and agreed to proceed.   History of Present Illness:    Observations/Objective: Unable to do physical exam via phone  Assessment and Plan:   Follow Up Instructions:    I discussed the assessment and treatment plan with the patient. The patient was provided an opportunity to ask questions and all were answered. The patient agreed with the plan and demonstrated an understanding of the instructions.   The patient was advised to call back or seek an in-person evaluation if the symptoms worsen or if the condition fails to improve as anticipated.  I provided 15 minutes of non-face-to-face time during this encounter.      Review of Systems  Constitutional: Negative for activity change, fatigue and fever.  HENT: Negative for congestion and rhinorrhea.   Respiratory: Negative for cough and shortness of breath.   Cardiovascular: Negative for  chest pain and leg swelling.  Gastrointestinal: Positive for diarrhea. Negative for abdominal pain, blood in stool and nausea.  Genitourinary: Negative for dysuria and hematuria.  Neurological: Negative for weakness and headaches.  Psychiatric/Behavioral: Negative for agitation and behavioral problems.       Objective:   Physical Exam Unable to do physical exam via phone       Assessment & Plan:  Possible colitis going on referral to gastroenterology patient wants to stay local.  We will set up with [facility] if progressive symptoms let us know

## 2018-09-11 ENCOUNTER — Telehealth: Payer: Self-pay | Admitting: Family Medicine

## 2018-09-11 NOTE — Telephone Encounter (Signed)
Pt called Dr. Roseanne Kaufman office to check on referral and states the secretary was very rude and he has not received a call back from them to schedule an appt. He would like to go back to the male doctor he saw years ago for colonoscopy. He doesn't remember her name.

## 2018-09-11 NOTE — Telephone Encounter (Signed)
Pt saw Dr.[name] previously. Please advise. Thank you

## 2018-09-14 NOTE — Telephone Encounter (Signed)
Refer to dr [name] group Derrek Monaco

## 2018-09-15 NOTE — Telephone Encounter (Signed)
Patient has scheduled and appt with Dr Laurier Nancy at [place] 09/23/2018

## 2018-09-15 NOTE — Telephone Encounter (Signed)
ok 

## 2018-09-16 ENCOUNTER — Encounter: Payer: Self-pay | Admitting: Internal Medicine

## 2018-09-22 ENCOUNTER — Encounter: Payer: Self-pay | Admitting: General Surgery

## 2018-09-22 NOTE — Progress Notes (Signed)
TELEHEALTH VISIT  Referring Provider: [name], MD Primary Care Physician:  [name], MD   Tele-visit due to COVID-19 pandemic Patient requested visit virtually, consented to the virtual encounter via audio enabled telemedicine application (Doximity) Contact made at: 9:30 09/23/18 Patient verified by name and date of birth Location of patient: Home Location provider: Home Names of persons participating: Me, patient, Tinnie Gens CMA Time spent on telehealth visit: 26 minutes I discussed the limitations of evaluation and management by telemedicine. The patient expressed understanding and agreed to proceed.  Reason for Consultation: Diarrhea   IMPRESSION:  Chronic, unexplained diarrhea with recent exacerbation    - C diff, stool culture, and O&P negative 08/19/18 History of "polyp" that was "polypoid colorectal mucosa" on colonoscopy in 2009    - no polyps on colonoscopy in 2015 Iron deficiency anemia 2015 attributed to reflux and stricture [name]) Benign, distal esophageal stricture associated with reflux    - biopsies showed reflux 2015    - dilated with 44m Savory dilatory 2015 ([name]) Small internal hemorrhoids on colonoscopy 2015 3cm hiatal hernia  No known family history of colon cancer or polyps.   The differential diagnosis of chronic diarrhea without alarm features includes:functional diarrhea, IBD, celiac disease, missed infection, food intolerance, microscopic colitis, thyroid disorder, other functional GI disease. By history, this is less likely to be obstruction or bacterial overgrowth.    PLAN: - Fecal calprotectin, ESR, and CRP to screen for IBD - TSH - Add daily psyllium for stool bulking  - EGD with duodenal biopsies given the patient's prior medical care - Colonoscopy with random biopsies and evaluation of the terminal ileum  I consented the patient at the bedside today discussing the risks, benefits, and alternatives to endoscopic  evaluation. In particular, we discussed the risks that include, but are not limited to, reaction to medication, cardiopulmonary compromise, bleeding requiring blood transfusion, aspiration resulting in pneumonia, perforation requiring surgery, and even death. We reviewed the risk of missed lesion including polyps or even cancer. The patient acknowledges these risks and asks that we proceed.   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 LWolfgang Phoenixfor evaluation of diarrhea.  The history is obtained to the patient and review of his electronic health record. He has a history of prostate cancer treated with 1-125 interstitial implant, asthma, and dyslipidemia.   Intermittent diarrhea for years that he has been able to control with Peptobismal PRN. He previously attributed it to stress. However, worse over the last couple of months and he is not responding to Peptobismal any more. At its worst he has had between 7 and 8 loose, explosive, watery bowel movements daily.  Symptoms have recently improved and he is now having 3-4 bowel movements daily. Associated urgency.  No blood or mucous. No longer responding to Peptobismal. Not getting better with increased fiber.  There is no associated abdominal pain or cramping. No blood or mucous in the stool. No systemic complaints. Weight stable. Appetite good. However, diet has changed since he stopped working a couple of months ago due to coronavirus. No other associated symptoms. No change with Lactaid and avoiding dairy.  No identified exacerbating or relieving features.   Stool studies 08/19/2018 were negative for C. difficile, ova and parasites, Salmonella, Shigella, Campylobacter, E. Coli.  There is been no recent abdominal imaging.  The patient had a colonoscopy in 2009 and was told that he had a polyp.  I do not have a copy  the procedure report from 2009 but the pathology results show a polypoid colorectal mucosa at 10 cm.  There were no  adenomatous changes or malignancy identified.  She had a colonoscopy with Dr. Maurene Capes 08/25/2013 for iron deficiency anemia with an iron saturation of 4%. The colonoscopy was normal except for small internal hemorrhoids.  The upper endoscopy revealed a benign-appearing distal esophageal stricture that was dilated to 16 mm with a savory dilator, esophagitis, a 3 cm nonreducible hiatal hernia.  Gastric biopsies showed mild chronic inactive gastritis without H. pylori.  Biopsies from the GE junction showed reactive and inflamed mucosa with no evidence for Barrett's esophagus.  Dr. Maurene Capes felt her iron deficiency anemia was due to upper GI blood loss from the esophageal stricture and gastroesophageal reflux.    No known family history of colon cancer or polyps. No family history of uterine/endometrial cancer, pancreatic cancer or gastric/stomach cancer.  Past Medical History:  Diagnosis Date  . Asthma 1998  . Diverticulitis   . Elevated PSA   . GERD (gastroesophageal reflux disease)   . Hyperlipidemia     Past Surgical History:  Procedure Laterality Date  . HERNIA REPAIR  1999  . prostate cancer    . TRANSURETHRAL RESECTION OF PROSTATE    . VASECTOMY      Current Outpatient Medications  Medication Sig Dispense Refill  . albuterol (PROVENTIL HFA;VENTOLIN HFA) 108 (90 Base) MCG/ACT inhaler Inhale 2 puffs into the lungs every 6 (six) hours as needed for wheezing. 1 Inhaler 6  . ALPRAZolam (XANAX) 1 MG tablet 1 qhs prn 30 tablet 5  . Ascorbic Acid (VITAMIN C) 100 MG tablet Take 100 mg by mouth daily.    . cetirizine (ZYRTEC) 10 MG tablet Take 10 mg by mouth daily.    Marland Kitchen co-enzyme Q-10 50 MG capsule Take 50 mg by mouth daily.    . diphenoxylate-atropine (LOMOTIL) 2.5-0.025 MG tablet 1 4 times daily as needed diarrhea (Patient not taking: Reported on 08/31/2018) 35 tablet 3  . ferrous sulfate 325 (65 FE) MG tablet Take 325 mg by mouth daily with breakfast.    . Garlic 286 MG TABS Take by mouth.    .  Melatonin 1 MG TABS Take by mouth.    . metroNIDAZOLE (FLAGYL) 500 MG tablet Take 1 tablet (500 mg total) by mouth 3 (three) times daily. (Patient not taking: Reported on 08/31/2018) 21 tablet 0  . Multiple Vitamins-Minerals (MULTIVITAMIN WITH MINERALS) tablet Take 1 tablet by mouth daily.    . niacin 100 MG tablet Take 100 mg by mouth at bedtime.    . Omega-3 Fatty Acids (FISH OIL) 1000 MG CAPS Take by mouth.    . Red Yeast Rice Extract (RED YEAST RICE PO) Take by mouth.     No current facility-administered medications for this visit.     Allergies as of 09/23/2018 - Review Complete 08/18/2018  Allergen Reaction Noted  . Augmentin [amoxicillin-pot clavulanate]  04/28/2015  . Ciprofloxacin  06/30/2013  . Nsaids Other (See Comments) 05/05/2014    Family History  Problem Relation Age of Onset  . Heart disease Father   . Hypertension Brother   . Diabetes Brother     Social History   Socioeconomic History  . Marital status: Married    Spouse name: Not on file  . Number of children: Not on file  . Years of education: Not on file  . Highest education level: Not on file  Occupational History  . Not on file  Social Needs  . Financial resource strain: Not on file  . Food insecurity:    Worry: Not on file    Inability: Not on file  . Transportation needs:    Medical: Not on file    Non-medical: Not on file  Tobacco Use  . Smoking status: Former Smoker    Types: Cigarettes    Last attempt to quit: 04/22/1982    Years since quitting: 36.4  . Smokeless tobacco: Never Used  Substance and Sexual Activity  . Alcohol use: Not on file  . Drug use: Not on file  . Sexual activity: Not on file  Lifestyle  . Physical activity:    Days per week: Not on file    Minutes per session: Not on file  . Stress: Not on file  Relationships  . Social connections:    Talks on phone: Not on file    Gets together: Not on file    Attends religious service: Not on file    Active member of club or  organization: Not on file    Attends meetings of clubs or organizations: Not on file    Relationship status: Not on file  . Intimate partner violence:    Fear of current or ex partner: Not on file    Emotionally abused: Not on file    Physically abused: Not on file    Forced sexual activity: Not on file  Other Topics Concern  . Not on file  Social History Narrative  . Not on file    Review of Systems: ALL ROS discussed and all others negative except listed in HPI.  Physical Exam: General: in no acute distress Neuro: Alert and appropriate Psych: Normal affect and normal insight   Beata Beason L. Tarri Glenn, MD, MPH [facility] 09/22/2018, 1:49 PM

## 2018-09-23 ENCOUNTER — Ambulatory Visit (INDEPENDENT_AMBULATORY_CARE_PROVIDER_SITE_OTHER): Payer: Medicare HMO | Admitting: Gastroenterology

## 2018-09-23 ENCOUNTER — Encounter: Payer: Self-pay | Admitting: Gastroenterology

## 2018-09-23 ENCOUNTER — Other Ambulatory Visit: Payer: Self-pay

## 2018-09-23 VITALS — Ht 68.0 in | Wt 178.0 lb

## 2018-09-23 DIAGNOSIS — R197 Diarrhea, unspecified: Secondary | ICD-10-CM | POA: Diagnosis not present

## 2018-09-23 MED ORDER — NA SULFATE-K SULFATE-MG SULF 17.5-3.13-1.6 GM/177ML PO SOLN: 1.0000 | ORAL | 0 refills | Status: DC

## 2018-09-23 NOTE — Patient Instructions (Addendum)
I have recommended some additional stool studies and blood tests.   Please add a dose of Metamucil to your daily regimen.  I have recommended a colonoscopy with random biopsies and an upper endoscopy (called an EGD) with biopsies from your small intestines.  I have recommended several stool and blood tests to evaluate your diarrhea.  Avoid high fat foods, as they can make diarrhea worse.   Dairy products (except yogurt) may be difficult to digest when you have diarrhea. I recommend that you continue to avoid lactose-containing foods.   Call with any concerns in the meantime.  Thank you for your patience with me and our technology today! Please stay home, safe, and healthy. I look forward to meeting you in person in the future.

## 2018-09-24 ENCOUNTER — Other Ambulatory Visit (INDEPENDENT_AMBULATORY_CARE_PROVIDER_SITE_OTHER): Payer: Medicare HMO

## 2018-09-24 ENCOUNTER — Telehealth: Payer: Self-pay | Admitting: Family Medicine

## 2018-09-24 DIAGNOSIS — E7849 Other hyperlipidemia: Secondary | ICD-10-CM

## 2018-09-24 DIAGNOSIS — R197 Diarrhea, unspecified: Secondary | ICD-10-CM | POA: Diagnosis not present

## 2018-09-24 DIAGNOSIS — Z1329 Encounter for screening for other suspected endocrine disorder: Secondary | ICD-10-CM

## 2018-09-24 LAB — SEDIMENTATION RATE: Sed Rate: 11 mm/hr (ref 0–20)

## 2018-09-24 LAB — C-REACTIVE PROTEIN: CRP: <1 mg/dL (ref 0.5–20.0)

## 2018-09-24 LAB — TSH: TSH: 6.17 u[IU]/mL — ABNORMAL HIGH (ref 0.35–4.50)

## 2018-09-24 MED ORDER — NA SULFATE-K SULFATE-MG SULF 17.5-3.13-1.6 GM/177ML PO SOLN: 1.0000 | ORAL | 0 refills | Status: DC

## 2018-09-24 NOTE — Telephone Encounter (Signed)
Nurses-please let the patient know that his recent lab work with the gastroenterologist shows a possibility of developing hypothyroidism.  I would recommend for this patient to do free T4 as well as TSH in 3 to 4 weeks.  If this issue is progressing we will need to start medication.  Please put in the orders then please send papers to the patient to do his lab work in 3 to 4 weeks thank you.

## 2018-09-24 NOTE — Addendum Note (Signed)
Addended by: Wyline Beady on: 09/24/2018 09:58 AM   Modules accepted: Orders

## 2018-09-25 ENCOUNTER — Other Ambulatory Visit: Payer: Medicare HMO

## 2018-09-25 DIAGNOSIS — R197 Diarrhea, unspecified: Secondary | ICD-10-CM | POA: Diagnosis not present

## 2018-09-25 NOTE — Addendum Note (Signed)
Addended by: Vicente Males on: 09/25/2018 11:08 AM   Modules accepted: Orders

## 2018-09-25 NOTE — Telephone Encounter (Signed)
Pt contacted and verbalized understanding. Lab orders placed and mailed to patient.

## 2018-09-28 ENCOUNTER — Telehealth: Payer: Self-pay | Admitting: *Deleted

## 2018-09-28 NOTE — Telephone Encounter (Signed)
Covid-19 screening questions  Have you traveled in the last 14 days? no If yes where?  Do you now or have you had a fever in the last 14 days? no  Do you have any respiratory symptoms of shortness of breath or cough now or in the last 14 days? no  Do you have any family members or close contacts with diagnosed or suspected Covid-19 in the past 14 days? no  Have you been tested for Covid-19 and found to be positive? no       

## 2018-09-29 ENCOUNTER — Ambulatory Visit (AMBULATORY_SURGERY_CENTER): Payer: Medicare HMO | Admitting: Gastroenterology

## 2018-09-29 ENCOUNTER — Encounter: Payer: Self-pay | Admitting: Gastroenterology

## 2018-09-29 ENCOUNTER — Other Ambulatory Visit: Payer: Self-pay

## 2018-09-29 VITALS — BP 130/81 | HR 85 | Temp 98.5°F | Resp 23 | Ht 68.0 in | Wt 178.0 lb

## 2018-09-29 DIAGNOSIS — J45909 Unspecified asthma, uncomplicated: Secondary | ICD-10-CM | POA: Diagnosis not present

## 2018-09-29 DIAGNOSIS — K2970 Gastritis, unspecified, without bleeding: Secondary | ICD-10-CM

## 2018-09-29 DIAGNOSIS — D128 Benign neoplasm of rectum: Secondary | ICD-10-CM

## 2018-09-29 DIAGNOSIS — K5730 Diverticulosis of large intestine without perforation or abscess without bleeding: Secondary | ICD-10-CM

## 2018-09-29 DIAGNOSIS — K449 Diaphragmatic hernia without obstruction or gangrene: Secondary | ICD-10-CM

## 2018-09-29 DIAGNOSIS — R197 Diarrhea, unspecified: Secondary | ICD-10-CM

## 2018-09-29 DIAGNOSIS — K2980 Duodenitis without bleeding: Secondary | ICD-10-CM | POA: Diagnosis not present

## 2018-09-29 DIAGNOSIS — K648 Other hemorrhoids: Secondary | ICD-10-CM

## 2018-09-29 DIAGNOSIS — D509 Iron deficiency anemia, unspecified: Secondary | ICD-10-CM | POA: Diagnosis not present

## 2018-09-29 MED ORDER — SODIUM CHLORIDE 0.9 % IV SOLN: 500.0000 mL | Freq: Once | INTRAVENOUS | Status: DC

## 2018-09-29 NOTE — Progress Notes (Signed)
Report given to PACU, vss 

## 2018-09-29 NOTE — Patient Instructions (Signed)
Please read handouts provided. Continue present medications. Await pathology results.      YOU HAD AN ENDOSCOPIC PROCEDURE TODAY AT THE [facility]:   Refer to the procedure report that was given to you for any specific questions about what was found during the examination.  If the procedure report does not answer your questions, please call your gastroenterologist to clarify.  If you requested that your care partner not be given the details of your procedure findings, then the procedure report has been included in a sealed envelope for you to review at your convenience later.  YOU SHOULD EXPECT: Some feelings of bloating in the abdomen. Passage of more gas than usual.  Walking can help get rid of the air that was put into your GI tract during the procedure and reduce the bloating. If you had a lower endoscopy (such as a colonoscopy or flexible sigmoidoscopy) you may notice spotting of blood in your stool or on the toilet paper. If you underwent a bowel prep for your procedure, you may not have a normal bowel movement for a few days.  Please Note:  You might notice some irritation and congestion in your nose or some drainage.  This is from the oxygen used during your procedure.  There is no need for concern and it should clear up in a day or so.  SYMPTOMS TO REPORT IMMEDIATELY:   Following lower endoscopy (colonoscopy or flexible sigmoidoscopy):  Excessive amounts of blood in the stool  Significant tenderness or worsening of abdominal pains  Swelling of the abdomen that is new, acute  Fever of 100F or higher   Following upper endoscopy (EGD)  Vomiting of blood or coffee ground material  New chest pain or pain under the shoulder blades  Painful or persistently difficult swallowing  New shortness of breath  Fever of 100F or higher  Black, tarry-looking stools  For urgent or emergent issues, a gastroenterologist can be reached at any hour by calling [phone]  [phone].   DIET:  We do recommend a small meal at first, but then you may proceed to your regular diet.  Drink plenty of fluids but you should avoid alcoholic beverages for 24 hours.  ACTIVITY:  You should plan to take it easy for the rest of today and you should NOT DRIVE or use heavy machinery until tomorrow (because of the sedation medicines used during the test).    FOLLOW UP: Our staff will call the number listed on your records 48-72 hours following your procedure to check on you and address any questions or concerns that you may have regarding the information given to you following your procedure. If we do not reach you, we will leave a message.  We will attempt to reach you two times.  During this call, we will ask if you have developed any symptoms of COVID 19. If you develop any symptoms (ie: fever, flu-like symptoms, shortness of breath, cough etc.) before then, please call [phone].  If you test positive for Covid 19 in the 2 weeks post procedure, please call and report this information to us.    If any biopsies were taken you will be contacted by phone or by letter within the next 1-3 weeks.  Please call us at [phone] if you have not heard about the biopsies in 3 weeks.    SIGNATURES/CONFIDENTIALITY: You and/or your care partner have signed paperwork which will be entered into your electronic medical record.  These signatures attest to the fact   that that the information above on your After Visit Summary has been reviewed and is understood.  Full responsibility of the confidentiality of this discharge information lies with you and/or your care-partner. 

## 2018-09-29 NOTE — Progress Notes (Signed)
Temp by CW VS by Hiram Comber

## 2018-09-29 NOTE — Progress Notes (Signed)
Called to room to assist during endoscopic procedure.  Patient ID and intended procedure confirmed with present staff. Received instructions for my participation in the procedure from the performing physician.  

## 2018-09-29 NOTE — Op Note (Addendum)
[facility] Patient Name: Thomas [name] Procedure Date: 09/29/2018 1:47 PM MRN: [id number] Endoscopist: Thornton Park MD, MD Age: 73 Referring MD:  Date of Birth: 10-Jun-1945 Gender: Male Account #: [id number] Procedure:                Colonoscopy Indications:              Clinically significant diarrhea of unexplained                            origin                           Chronic, unexplained diarrhea with recent                            exacerbation                           - C diff, stool culture, and O&P negative 08/19/18                           History of "polyp" that was "polypoid colorectal                            mucosa" on colonoscopy in 2009                           - no polyps on colonoscopy in 2015                           Iron deficiency anemia 2015 attributed to reflux                            and stricture [name])                           Small internal hemorrhoids on colonoscopy 2015                           No known family history of colon cancer or polyps Medicines:                See the Anesthesia note for documentation of the                            administered medications Procedure:                Pre-Anesthesia Assessment:                           - Prior to the procedure, a History and Physical                            was performed, and patient medications and                            allergies were reviewed. The patient's tolerance of  previous anesthesia was also reviewed. The risks                            and benefits of the procedure and the sedation                            options and risks were discussed with the patient.                            All questions were answered, and informed consent                            was obtained. Prior Anticoagulants: The patient has                            taken no previous anticoagulant or antiplatelet   agents. ASA Grade Assessment: II - A patient with                            mild systemic disease. After reviewing the risks                            and benefits, the patient was deemed in                            satisfactory condition to undergo the procedure.                           After obtaining informed consent, the colonoscope                            was passed under direct vision. Throughout the                            procedure, the patient's blood pressure, pulse, and                            oxygen saturations were monitored continuously. The                            Colonoscope was introduced through the anus and                            advanced to the the terminal ileum, with                            identification of the appendiceal orifice and IC                            valve. The colonoscopy was performed without                            difficulty. The patient tolerated the procedure  well. The quality of the bowel preparation was                            good. The terminal ileum, ileocecal valve,                            appendiceal orifice, and rectum were photographed. Scope In: 2:10:46 PM Scope Out: 2:21:29 PM Scope Withdrawal Time: 0 hours 6 minutes 7 seconds  Total Procedure Duration: 0 hours 10 minutes 43 seconds  Findings:                 Hemorrhoids were found on perianal exam. Small                            internal hemorrhoids are also present.                           A few small and large-mouthed diverticula were                            found in the left colon.                           A 2 mm polyp was found in the rectum. The polyp was                            sessile. The polyp was removed with a cold snare.                            Resection and retrieval were complete. Estimated                            blood loss was minimal.                           The colon (entire examined  portion) appeared                            normal. Biopsies for histology were taken with a                            cold forceps from the entire colon for evaluation                            of microscopic colitis. Estimated blood loss was                            minimal.                           The exam was otherwise without abnormality on                            direct and retroflexion views. Complications:  No immediate complications. Estimated blood loss:                            Minimal. Estimated Blood Loss:     Estimated blood loss: none. Impression:               - Hemorrhoids found on perianal exam. Small                            internal hemorrhoids are also present.                           - Diverticulosis in the left colon.                           - One 2 mm polyp in the rectum, removed with a cold                            snare. Resected and retrieved.                           - The entire examined colon is normal. Biopsied.                           - The examination was otherwise normal on direct                            and retroflexion views. Recommendation:           - Patient has a contact number available for                            emergencies. The signs and symptoms of potential                            delayed complications were discussed with the                            patient. Return to normal activities tomorrow.                            Written discharge instructions were provided to the                            patient.                           - Resume regular diet today. High fiber diet                            recommended.                           - Continue present medications.                           - Await pathology  results.                           - No surveillance endoscopy recommended given his                            age. Thornton Park MD, MD 09/29/2018 2:35:41 PM This report has been  signed electronically.

## 2018-09-29 NOTE — Op Note (Signed)
[facility] Patient Name: Thomas [name] Procedure Date: 09/29/2018 1:47 PM MRN: [id number] Endoscopist: Thornton Park MD, MD Age: 73 Referring MD:  Date of Birth: 08/12/1945 Gender: Male Account #: [id number] Procedure:                Upper GI endoscopy Indications:              Diarrhea                           Chronic, unexplained diarrhea with recent                            exacerbation                           - C diff, stool culture, and O&P negative 08/19/18                           Iron deficiency anemia 2015 attributed to reflux                            and stricture [name])                           Benign, distal esophageal stricture associated with                            reflux                           - biopsies showed reflux 2015                           - dilated with 39mm Savory dilatory 2015 ([name]) Medicines:                See the Anesthesia note for documentation of the                            administered medications Procedure:                Pre-Anesthesia Assessment:                           - Prior to the procedure, a History and Physical                            was performed, and patient medications and                            allergies were reviewed. The patient's tolerance of                            previous anesthesia was also reviewed. The risks                            and benefits of the procedure and the sedation  options and risks were discussed with the patient.                            All questions were answered, and informed consent                            was obtained. Prior Anticoagulants: The patient has                            taken no previous anticoagulant or antiplatelet                            agents. ASA Grade Assessment: II - A patient with                            mild systemic disease. After reviewing the risks                            and benefits,  the patient was deemed in                            satisfactory condition to undergo the procedure.                           After obtaining informed consent, the endoscope was                            passed under direct vision. Throughout the                            procedure, the patient's blood pressure, pulse, and                            oxygen saturations were monitored continuously. The                            Endoscope was introduced through the mouth, and                            advanced to the third part of duodenum. The upper                            GI endoscopy was accomplished without difficulty.                            The patient tolerated the procedure well. Scope In: Scope Out: Findings:                 The esophagus was normal.                           A large hiatal hernia was present.                           Diffuse  mild inflammation was found at the pylorus.                            Biopsies were taken with a cold forceps for                            histology. Estimated blood loss was minimal.                           The examined duodenum was normal except for a                            medium sized lipoma in the bulb. Biopsies were                            taken with a cold forceps for histology. Estimated                            blood loss was minimal.                           The cardia and gastric fundus were normal on                            retroflexion.                           The exam was otherwise without abnormality. Complications:            No immediate complications. Estimated blood loss:                            Minimal. Estimated Blood Loss:     Estimated blood loss was minimal. Impression:               - Normal esophagus.                           - Large hiatal hernia.                           - Gastritis. Biopsied.                           - Normal examined duodenum except for a medium                             sized lipoma. Biopsied.                           - The examination was otherwise normal. Recommendation:           - Patient has a contact number available for                            emergencies. The signs and symptoms of potential  delayed complications were discussed with the                            patient. Return to normal activities tomorrow.                            Written discharge instructions were provided to the                            patient.                           - Resume regular diet.                           - Continue present medications.                           - Await pathology results.                           - Repeat upper endoscopy is not indicated at this                            time. Thornton Park MD, MD 09/29/2018 2:31:30 PM This report has been signed electronically.

## 2018-10-01 ENCOUNTER — Telehealth: Payer: Self-pay | Admitting: *Deleted

## 2018-10-01 NOTE — Telephone Encounter (Signed)
First attempt, left VM.  

## 2018-10-01 NOTE — Telephone Encounter (Signed)
  Follow up Call-  Call back number 09/29/2018  Post procedure Call Back phone  # [phone]  Permission to leave phone message Yes  Some recent data might be hidden     Patient questions:  Do you have a fever, pain , or abdominal swelling? No. Pain Score  0 *  Have you tolerated food without any problems? Yes.    Have you been able to return to your normal activities? Yes.    Do you have any questions about your discharge instructions: Diet   No. Medications  No. Follow up visit  No.  Do you have questions or concerns about your Care? No.  Actions: * If pain score is 4 or above: No action needed, pain <4.  1. Have you developed a fever since your procedure? NO  2.   Have you had an respiratory symptoms (SOB or cough) since your procedure? NO  3.   Have you tested positive for COVID 19 since your procedure NO  4.   Have you had any family members/close contacts diagnosed with the COVID 19 since your procedure?  NO   If yes to any of these questions please route to Joylene John, RN and Alphonsa Gin, RN.

## 2018-10-05 ENCOUNTER — Encounter: Payer: Self-pay | Admitting: Gastroenterology

## 2018-10-05 LAB — CALPROTECTIN, FECAL: Calprotectin, Fecal: <16 ug/g (ref 0–120)

## 2018-10-15 DIAGNOSIS — Z1329 Encounter for screening for other suspected endocrine disorder: Secondary | ICD-10-CM | POA: Diagnosis not present

## 2018-10-16 LAB — T4, FREE: Free T4: 0.88 ng/dL (ref 0.82–1.77)

## 2018-10-16 LAB — TSH: TSH: 7.48 u[IU]/mL — ABNORMAL HIGH (ref 0.450–4.500)

## 2018-10-19 NOTE — Addendum Note (Signed)
Addended by: Vicente Males on: 10/19/2018 10:34 AM   Modules accepted: Orders

## 2018-11-05 ENCOUNTER — Other Ambulatory Visit: Payer: Self-pay | Admitting: *Deleted

## 2018-11-05 MED ORDER — ALBUTEROL SULFATE HFA 108 (90 BASE) MCG/ACT IN AERS: 2.0000 | INHALATION_SPRAY | Freq: Four times a day (QID) | RESPIRATORY_TRACT | 0 refills | Status: DC | PRN

## 2018-11-09 ENCOUNTER — Other Ambulatory Visit: Payer: Self-pay

## 2018-11-09 ENCOUNTER — Telehealth: Payer: Self-pay | Admitting: Family Medicine

## 2018-11-09 MED ORDER — ALBUTEROL SULFATE HFA 108 (90 BASE) MCG/ACT IN AERS: 2.0000 | INHALATION_SPRAY | Freq: Four times a day (QID) | RESPIRATORY_TRACT | 0 refills | Status: DC | PRN

## 2018-11-09 NOTE — Telephone Encounter (Signed)
Please advise. Thank you

## 2018-11-09 NOTE — Telephone Encounter (Signed)
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  Patient said he only takes benadryl at night to help him sleep. 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  Form in folder.   Pt # [phone]

## 2018-11-12 ENCOUNTER — Other Ambulatory Visit: Payer: Self-pay

## 2018-11-12 NOTE — Telephone Encounter (Signed)
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  Please let the patient know that with his form we will be happy to fill out but I need to do a phone visit with him.  We need to verify that he is compliant with not using these medications during his driving.  We could complete this on Friday afternoon.  Call patient set up for Friday afternoon if patient unable to do then we can do it next week

## 2018-11-12 NOTE — Telephone Encounter (Signed)
Pt contacted and verbalized understanding. Pt transferred up front to set up appt time for Friday afternoon. (Pt states he told the nurse at his physical that he takes Benadryl at night for allergies and itching.)

## 2018-11-13 ENCOUNTER — Ambulatory Visit (INDEPENDENT_AMBULATORY_CARE_PROVIDER_SITE_OTHER): Payer: Medicare HMO | Admitting: Family Medicine

## 2018-11-13 DIAGNOSIS — G4700 Insomnia, unspecified: Secondary | ICD-10-CM | POA: Diagnosis not present

## 2018-11-13 NOTE — Progress Notes (Signed)
   Subjective:    Patient ID: Thomas [name], male    DOB: 03-Jul-1945, 73 y.o.   MRN: [id number]  HPI Video Patient calls to go over his DOT forms. This patient has chronic insomnia.  Rarely he uses Xanax at nighttime.  Primarily he uses Benadryl at nighttime.  He never takes either during the day.  It does not cause any drowsiness during the day.  He tolerates the medication well.  He denies any drowsiness during the day no sleepiness with driving no sign of any type of setbacks regarding seizures passing out or any other altered mental state patient does drive DOT.  Needs a statement stating that he does not have any setbacks that would prevent him from being able to safely operate his vehicle.  So further discussion was held with the patient he affirmed that he never uses the medication during the day rarely uses Xanax at night and pretty much takes Benadryl at nighttime it helps him Virtual Visit via Video Note  I connected with Thomas [name] on 11/13/18 at  3:50 PM EDT by a video enabled telemedicine application and verified that I am speaking with the correct person using two identifiers.  Location: Patient: home Provider: office   I discussed the limitations of evaluation and management by telemedicine and the availability of in person appointments. The patient expressed understanding and agreed to proceed.  History of Present Illness:    Observations/Objective:   Assessment and Plan:   Follow Up Instructions:    I discussed the assessment and treatment plan with the patient. The patient was provided an opportunity to ask questions and all were answered. The patient agreed with the plan and demonstrated an understanding of the instructions.   The patient was advised to call back or seek an in-person evaluation if the symptoms worsen or if the condition fails to improve as anticipated.  I provided 15 minutes of non-face-to-face time during this encounter.      Review of Systems  Constitutional: Negative for activity change, fatigue and fever.  HENT: Negative for congestion and rhinorrhea.   Respiratory: Negative for cough and shortness of breath.   Cardiovascular: Negative for chest pain and leg swelling.  Gastrointestinal: Negative for abdominal pain, diarrhea and nausea.  Genitourinary: Negative for dysuria and hematuria.  Neurological: Negative for weakness and headaches.  Psychiatric/Behavioral: Negative for agitation and behavioral problems.       Objective:   Physical Exam Patient had virtual visit Appears to be in no distress Atraumatic Neuro able to relate and oriented No apparent resp distress Color normal        Assessment & Plan:  Insomnia Uses Benadryl intermittently at nighttime without any problems Rarely uses Xanax at nighttime Does not use either medicines during the day He is considered to be safe to drive based upon the question of his a Benadryl at nighttime okay for him driving during the day and based upon his responses stating that it does not cause drowsiness during the day it would be okay for him to use medication as stated above  The rest of his health overall is doing well he will schedule regular follow-up visits regarding that

## 2018-11-19 ENCOUNTER — Ambulatory Visit: Payer: Medicare HMO | Admitting: Nurse Practitioner

## 2018-12-02 ENCOUNTER — Other Ambulatory Visit: Payer: Self-pay | Admitting: Family Medicine

## 2019-03-08 ENCOUNTER — Ambulatory Visit (INDEPENDENT_AMBULATORY_CARE_PROVIDER_SITE_OTHER): Payer: Medicare HMO | Admitting: Family Medicine

## 2019-03-08 DIAGNOSIS — E785 Hyperlipidemia, unspecified: Secondary | ICD-10-CM | POA: Diagnosis not present

## 2019-03-08 DIAGNOSIS — R197 Diarrhea, unspecified: Secondary | ICD-10-CM

## 2019-03-08 DIAGNOSIS — E611 Iron deficiency: Secondary | ICD-10-CM

## 2019-03-08 MED ORDER — DIPHENOXYLATE-ATROPINE 2.5-0.025 MG PO TABS: ORAL_TABLET | ORAL | 3 refills | Status: DC

## 2019-03-08 NOTE — Progress Notes (Signed)
   Subjective:    Patient ID: Thomas [name], male    DOB: 1945-10-19, 73 y.o.   MRN: [id number]  HPIhaving loose stools for a few months. Had colonoscopy in June. Taking metamucil tid and gas x daily. Has between 4 -6 stools a day. Wakes him up in the middle of the night.  Significant diarrhea issues multiple times a day He does state he takes Metamucil several times a day he thinks it does help Denies mucousy stools Has had diarrhea ever since earlier this spring for no apparent reason Stool studies negative for parasite Patient had colonoscopy which was negative Has not done a follow-up with GI. Virtual Visit via Telephone Note  I connected with Thomas [name] on 03/08/19 at  4:10 PM EST by telephone and verified that I am speaking with the correct person using two identifiers.  Location: Patient: home Provider: office   I discussed the limitations, risks, security and privacy concerns of performing an evaluation and management service by telephone and the availability of in person appointments. I also discussed with the patient that there may be a patient responsible charge related to this service. The patient expressed understanding and agreed to proceed.   History of Present Illness:    Observations/Objective:   Assessment and Plan:   Follow Up Instructions:    I discussed the assessment and treatment plan with the patient. The patient was provided an opportunity to ask questions and all were answered. The patient agreed with the plan and demonstrated an understanding of the instructions.   The patient was advised to call back or seek an in-person evaluation if the symptoms worsen or if the condition fails to improve as anticipated.  I provided 17 minutes of non-face-to-face time during this encounter.      Review of Systems  Constitutional: Negative for activity change.  HENT: Negative for congestion and rhinorrhea.   Respiratory: Negative for cough and  shortness of breath.   Cardiovascular: Negative for chest pain.  Gastrointestinal: Positive for diarrhea. Negative for abdominal pain, nausea and vomiting.  Genitourinary: Negative for dysuria and hematuria.  Neurological: Negative for weakness and headaches.  Psychiatric/Behavioral: Negative for behavioral problems and confusion.       Objective:   Physical Exam  Today's visit was via telephone Physical exam was not possible for this visit  He will do lab work in the near future     Assessment & Plan:  Persistent diarrhea Imodium as directed Referral back to gastroenterology for their input Hold off on additional stool studies to wait and see what gastroenterology has to say Patient denies mucousy stools denies bloody stools

## 2019-03-09 ENCOUNTER — Telehealth: Payer: Self-pay | Admitting: *Deleted

## 2019-03-09 ENCOUNTER — Encounter: Payer: Self-pay | Admitting: *Deleted

## 2019-03-09 NOTE — Progress Notes (Signed)
Thank you for the follow-up. Thomas [name], Please schedule follow-up with me or an APP.  Thanks.

## 2019-03-09 NOTE — Telephone Encounter (Signed)
Patient scheduled for f/u with Dr. Tarri Glenn on 04/19/2019 at 1:50 pm. Reminder letter mailed to patient.

## 2019-03-09 NOTE — Addendum Note (Signed)
Addended by: Vicente Males on: 03/09/2019 11:36 AM   Modules accepted: Orders

## 2019-03-09 NOTE — Progress Notes (Signed)
Referral placed.

## 2019-03-15 DIAGNOSIS — E785 Hyperlipidemia, unspecified: Secondary | ICD-10-CM | POA: Diagnosis not present

## 2019-03-15 DIAGNOSIS — E611 Iron deficiency: Secondary | ICD-10-CM | POA: Diagnosis not present

## 2019-03-15 DIAGNOSIS — R197 Diarrhea, unspecified: Secondary | ICD-10-CM | POA: Diagnosis not present

## 2019-03-16 ENCOUNTER — Encounter: Payer: Self-pay | Admitting: Family Medicine

## 2019-03-16 DIAGNOSIS — E039 Hypothyroidism, unspecified: Secondary | ICD-10-CM

## 2019-03-16 HISTORY — DX: Hypothyroidism, unspecified: E03.9

## 2019-03-16 LAB — CBC WITH DIFFERENTIAL/PLATELET
Basophils Absolute: 0.1 10*3/uL (ref 0.0–0.2)
Basos: 1 %
EOS (ABSOLUTE): 0.4 10*3/uL (ref 0.0–0.4)
Eos: 6 %
Hematocrit: 45.9 % (ref 37.5–51.0)
Hemoglobin: 15.3 g/dL (ref 13.0–17.7)
Immature Grans (Abs): 0 10*3/uL (ref 0.0–0.1)
Immature Granulocytes: 0 %
Lymphocytes Absolute: 1.3 10*3/uL (ref 0.7–3.1)
Lymphs: 21 %
MCH: 31.2 pg (ref 26.6–33.0)
MCHC: 33.3 g/dL (ref 31.5–35.7)
MCV: 94 fL (ref 79–97)
Monocytes Absolute: 0.7 10*3/uL (ref 0.1–0.9)
Monocytes: 10 %
Neutrophils Absolute: 4 10*3/uL (ref 1.4–7.0)
Neutrophils: 62 %
Platelets: 228 10*3/uL (ref 150–450)
RBC: 4.91 x10E6/uL (ref 4.14–5.80)
RDW: 12.9 % (ref 11.6–15.4)
WBC: 6.5 10*3/uL (ref 3.4–10.8)

## 2019-03-16 LAB — LIPID PANEL
Chol/HDL Ratio: 4.2 ratio (ref 0.0–5.0)
Cholesterol, Total: 238 mg/dL — ABNORMAL HIGH (ref 100–199)
HDL: 57 mg/dL (ref >39)
LDL Chol Calc (NIH): 159 mg/dL — ABNORMAL HIGH (ref 0–99)
Triglycerides: 122 mg/dL (ref 0–149)
VLDL Cholesterol Cal: 22 mg/dL (ref 5–40)

## 2019-03-16 LAB — HEPATIC FUNCTION PANEL
ALT: 15 IU/L (ref 0–44)
AST: 15 IU/L (ref 0–40)
Albumin: 4.4 g/dL (ref 3.7–4.7)
Alkaline Phosphatase: 85 IU/L (ref 39–117)
Bilirubin Total: 0.3 mg/dL (ref 0.0–1.2)
Bilirubin, Direct: 0.1 mg/dL (ref 0.00–0.40)
Total Protein: 6.5 g/dL (ref 6.0–8.5)

## 2019-03-16 LAB — FERRITIN: Ferritin: 52 ng/mL (ref 30–400)

## 2019-03-16 LAB — IRON AND TIBC
Iron Saturation: 27 % (ref 15–55)
Iron: 91 ug/dL (ref 38–169)
Total Iron Binding Capacity: 343 ug/dL (ref 250–450)
UIBC: 252 ug/dL (ref 111–343)

## 2019-03-16 LAB — TSH: TSH: 7.36 u[IU]/mL — ABNORMAL HIGH (ref 0.450–4.500)

## 2019-03-16 LAB — T4, FREE: Free T4: 0.84 ng/dL (ref 0.82–1.77)

## 2019-03-19 ENCOUNTER — Telehealth: Payer: Self-pay | Admitting: Family Medicine

## 2019-03-19 NOTE — Telephone Encounter (Signed)
Pt checking to see if levothyroxine has been sent to pharmacy.   Note in labs about patient wanting to start medication.

## 2019-03-19 NOTE — Telephone Encounter (Signed)
Please advise. Thank you

## 2019-03-21 NOTE — Telephone Encounter (Signed)
Please see notation in labs thank you Levothyroxine authorized

## 2019-03-22 NOTE — Telephone Encounter (Signed)
Left message to return call 

## 2019-03-23 ENCOUNTER — Other Ambulatory Visit: Payer: Self-pay | Admitting: *Deleted

## 2019-03-23 DIAGNOSIS — E039 Hypothyroidism, unspecified: Secondary | ICD-10-CM

## 2019-03-23 MED ORDER — LEVOTHYROXINE SODIUM 25 MCG PO TABS: 25.0000 ug | ORAL_TABLET | Freq: Every day | ORAL | 0 refills | Status: DC

## 2019-03-23 NOTE — Telephone Encounter (Signed)
Discussed with pt. Pt verbalized understanding. Med sent to pharm and orders put in for bw to do in 8-10 weeks.

## 2019-03-27 DIAGNOSIS — Z8249 Family history of ischemic heart disease and other diseases of the circulatory system: Secondary | ICD-10-CM | POA: Diagnosis not present

## 2019-03-27 DIAGNOSIS — Z87891 Personal history of nicotine dependence: Secondary | ICD-10-CM | POA: Diagnosis not present

## 2019-03-27 DIAGNOSIS — E039 Hypothyroidism, unspecified: Secondary | ICD-10-CM | POA: Diagnosis not present

## 2019-03-27 DIAGNOSIS — Z79899 Other long term (current) drug therapy: Secondary | ICD-10-CM | POA: Diagnosis not present

## 2019-03-27 DIAGNOSIS — K219 Gastro-esophageal reflux disease without esophagitis: Secondary | ICD-10-CM | POA: Diagnosis not present

## 2019-03-27 DIAGNOSIS — Z8546 Personal history of malignant neoplasm of prostate: Secondary | ICD-10-CM | POA: Diagnosis not present

## 2019-03-27 DIAGNOSIS — J309 Allergic rhinitis, unspecified: Secondary | ICD-10-CM | POA: Diagnosis not present

## 2019-03-31 ENCOUNTER — Other Ambulatory Visit: Payer: Self-pay | Admitting: Family Medicine

## 2019-04-19 ENCOUNTER — Ambulatory Visit: Payer: Medicare HMO | Admitting: Gastroenterology

## 2019-04-19 NOTE — Progress Notes (Deleted)
Referring Provider: [name], MD Primary Care Physician:  [name], MD   Tele-visit due to COVID-19 pandemic Patient requested visit virtually, consented to the virtual encounter via audio enabled telemedicine application (Doximity) Contact made at: 9:30 09/23/18 Patient verified by name and date of birth Location of patient: Home Location provider: Home Names of persons participating: Me, patient, Tinnie Gens CMA Time spent on telehealth visit: 26 minutes I discussed the limitations of evaluation and management by telemedicine. The patient expressed understanding and agreed to proceed.  Chief complaint: Diarrhea   IMPRESSION:  Chronic, unexplained diarrhea with recent exacerbation    - C diff, stool culture, and O&P negative 08/19/18    - ESR, CRP, and fecal calprotectin normal History of "polyp" that was "polypoid colorectal mucosa" on colonoscopy in 2009    - no polyps on colonoscopy in 2015    - rectal tubular adenoma on colonoscopy 05/2018 Hypothyroidism Iron deficiency anemia 2015 attributed to reflux and stricture [name])    - hemoglobin 15.3 03/15/19 Benign, distal esophageal stricture associated with reflux    - biopsies showed reflux 2015    - dilated with 20m Savory dilatory 2015 ([name]) Small internal hemorrhoids on colonoscopy 2015 3cm hiatal hernia  No known family history of colon cancer or polyps.   Fecal calprotectin, ESR, and CRP were normal. Giardia testing was negative.  EGD with duodenal biopsies was normal. Colonoscopy with random biopsies showed no microscopic colitis.  Trial of stool bulking agent recommended.    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 LWolfgang Phoenixfor evaluation of diarrhea.  Originally seen in consultation 09/23/2018.  Had endoscopic evaluation 09/29/2018.  He is now rereferred by Dr. LWolfgang Phoenixfor ongoing diarrhea.  The interval history is  obtained to the patient and review of his electronic health record. He has a history of prostate cancer treated with 1-125 interstitial implant, asthma, and dyslipidemia.   Intermittent diarrhea for years that he has been able to control with Peptobismal PRN. He previously attributed it to stress. However, worse over the last couple of months and he is not responding to Peptobismal any more. At its worst he has had between 7 and 8 loose, explosive, watery bowel movements daily.  Symptoms have recently improved and he is now having 3-4 bowel movements daily. Associated urgency.  No blood or mucous. No longer responding to Peptobismal. Not getting better with increased fiber.  There is no associated abdominal pain or cramping. No blood or mucous in the stool. No systemic complaints. Weight stable. Appetite good. However, diet has changed since he stopped working a couple of months ago due to coronavirus. No other associated symptoms. No change with Lactaid and avoiding dairy.  No identified exacerbating or relieving features.   Stool studies 08/19/2018 were negative for C. difficile, ova and parasites, Salmonella, Shigella, Campylobacter, E. Coli.  There is been no recent abdominal imaging. Labs from 09/24/2018 show a CRP less than 1, sed rate 11, fecal calprotectin less than 16, and TSH of 6.17.  Stool study was negative for ova and parasites.  Subsequent thyroid testing shows persistently elevated TSH at 7.360 with a free T4 of 0.84 Labs 03/15/2019 show a hemoglobin of 15.3, MCV 94, RDW 12.9, platelets 228  Colonoscopy 09/29/18 showed hemorrhoids, left-sided diverticulosis, and a rectal tubular adenoma.  Upper endoscopy 09/29/18 large hiatal hernia, pyloric gastritis, and a medium sized  duodenal bulb lipoma.  Duodenal biopsies showed peptic duodenitis.  Gastric biopsies were normal.  There was no H. pylori, intestinal metaplasia, or malignancy.  Random colon biopsies were negative for microscopic colitis.  A rectal  tubular adenoma was removed.     Endoscopic history - Colonoscopy 2009:  polypoid colorectal mucosa at 10 cm - Colonoscopy 08/25/13 for iron deficiency anemia [name]): small internal hemorrhoids - EGD 08/26/14 for iron deficiency anemia:  benign-appearing distal esophageal stricture that was dilated to 16 mm with a savory dilator, esophagitis, a 3 cm nonreducible hiatal hernia.  Gastric biopsies showed mild chronic inactive gastritis without H. pylori.  Biopsies from the GE junction showed reactive and inflamed mucosa with no evidence for Barrett's esophagus. - Colonoscopy 09/29/18: hemorrhoids, left-sided diverticulosis, and a small rectal tubular adenoma.  Random colon biopsies were negative for microscopic colitis - EGD 09/29/18 large hiatal hernia, pyloric gastritis, and a medium sized duodenal bulb lipoma.  Duodenal biopsies showed peptic duodenitis.  Gastric biopsies were normal.  There was no H. pylori, intestinal metaplasia, or malignancy.   Past Medical History:  Diagnosis Date  . Allergy    trees and plants  . Anemia    IDA  . Anxiety   . Asthma 1998  . Diverticulitis   . Elevated PSA   . GERD (gastroesophageal reflux disease)   . Hyperlipidemia   . Hypothyroidism 03/16/2019  . Prostate cancer [facility])     Past Surgical History:  Procedure Laterality Date  . HERNIA REPAIR  1999  . prostate cancer    . TRANSURETHRAL RESECTION OF PROSTATE    . VASECTOMY      Current Outpatient Medications  Medication Sig Dispense Refill  . albuterol (VENTOLIN HFA) 108 (90 Base) MCG/ACT inhaler TAKE 2 PUFFS BY MOUTH EVERY 6 HOURS AS NEEDED FOR WHEEZE 18 g 3  . ALPRAZolam (XANAX) 1 MG tablet 1 qhs prn 30 tablet 5  . Ascorbic Acid (VITAMIN C) 100 MG tablet Take 100 mg by mouth daily.    . cetirizine (ZYRTEC) 10 MG tablet Take 10 mg by mouth daily.    Marland Kitchen co-enzyme Q-10 50 MG capsule Take 50 mg by mouth daily.    . diphenhydrAMINE (BENADRYL) 25 MG tablet Take 25 mg by mouth every 6 (six) hours as  needed.    . diphenoxylate-atropine (LOMOTIL) 2.5-0.025 MG tablet 1 pill  3 times daily as needed diarrhea 35 tablet 3  . ferrous sulfate 325 (65 FE) MG tablet Take 325 mg by mouth daily with breakfast.    . Garlic 371 MG TABS Take by mouth.    . levothyroxine (SYNTHROID) 25 MCG tablet TAKE 1 TABLET BY MOUTH DAILY BEFORE BREAKFAST. 90 tablet 0  . Melatonin 1 MG TABS Take by mouth.    . Multiple Vitamins-Minerals (MULTIVITAMIN WITH MINERALS) tablet Take 1 tablet by mouth daily.    . niacin 100 MG tablet Take 100 mg by mouth at bedtime.    . Omega-3 Fatty Acids (FISH OIL) 1000 MG CAPS Take by mouth.    . Red Yeast Rice Extract (RED YEAST RICE PO) Take by mouth.    . silodosin (RAPAFLO) 4 MG CAPS capsule Take 4 mg by mouth daily with breakfast.     No current facility-administered medications for this visit.    Allergies as of 04/19/2019 - Review Complete 03/08/2019  Allergen Reaction Noted  . Augmentin [amoxicillin-pot clavulanate]  04/28/2015  . Ciprofloxacin  06/30/2013  . Nsaids Other (See Comments) 05/05/2014    Family  History  Problem Relation Age of Onset  . Heart disease Father   . Hypertension Brother   . Diabetes Brother     Social History   Socioeconomic History  . Marital status: Married    Spouse name: Not on file  . Number of children: Not on file  . Years of education: Not on file  . Highest education level: Not on file  Occupational History  . Not on file  Tobacco Use  . Smoking status: Former Smoker    Types: Cigarettes    Quit date: 04/22/1982    Years since quitting: 37.0  . Smokeless tobacco: Never Used  Substance and Sexual Activity  . Alcohol use: Not Currently  . Drug use: Not Currently  . Sexual activity: Not on file  Other Topics Concern  . Not on file  Social History Narrative  . Not on file   Social Determinants of Health   Financial Resource Strain:   . Difficulty of Paying Living Expenses: Not on file  Food Insecurity:   . Worried  About Charity fundraiser in the Last Year: Not on file  . Ran Out of Food in the Last Year: Not on file  Transportation Needs:   . Lack of Transportation (Medical): Not on file  . Lack of Transportation (Non-Medical): Not on file  Physical Activity:   . Days of Exercise per Week: Not on file  . Minutes of Exercise per Session: Not on file  Stress:   . Feeling of Stress : Not on file  Social Connections:   . Frequency of Communication with Friends and Family: Not on file  . Frequency of Social Gatherings with Friends and Family: Not on file  . Attends Religious Services: Not on file  . Active Member of Clubs or Organizations: Not on file  . Attends Archivist Meetings: Not on file  . Marital Status: Not on file  Intimate Partner Violence:   . Fear of Current or Ex-Partner: Not on file  . Emotionally Abused: Not on file  . Physically Abused: Not on file  . Sexually Abused: Not on file     Physical Exam: General: in no acute distress Neuro: Alert and appropriate Psych: Normal affect and normal insight   Manna Gose L. Tarri Glenn, MD, MPH [facility] 04/19/2019, 9:12 AM

## 2019-04-21 ENCOUNTER — Telehealth: Payer: Self-pay | Admitting: Family Medicine

## 2019-04-21 NOTE — Telephone Encounter (Signed)
What type of reaction did the patient have to the flu vaccine? (More than likely will be able to take the Covid vaccine but I would like to know this first before giving advice-if he will go ahead and give the reaction he had I can handle this message over the weekend)

## 2019-04-21 NOTE — Telephone Encounter (Signed)
Pt wants to know if he is healthy enough for the COVID vaccine. He had a reaction a few years ago to the flu shot

## 2019-04-22 NOTE — Telephone Encounter (Signed)
Left message to return call 

## 2019-04-26 NOTE — Telephone Encounter (Signed)
Pt states he does not remember what kind of reaction he had to the flu shot. But he has not taken the flu shot since he had the reaction.    CB# [phone]

## 2019-04-26 NOTE — Telephone Encounter (Signed)
Discussed with pt. Pt verbalized understanding.  °

## 2019-04-26 NOTE — Telephone Encounter (Signed)
He should be able to get the vaccine without trouble If for some reason he had a anaphylactic reaction I would recommend against it but if it was more of just a localized reaction I would still recommend the Covid vaccine

## 2019-04-30 ENCOUNTER — Ambulatory Visit (INDEPENDENT_AMBULATORY_CARE_PROVIDER_SITE_OTHER): Payer: Medicare HMO | Admitting: Family Medicine

## 2019-04-30 DIAGNOSIS — Z20822 Contact with and (suspected) exposure to covid-19: Secondary | ICD-10-CM

## 2019-04-30 DIAGNOSIS — J019 Acute sinusitis, unspecified: Secondary | ICD-10-CM

## 2019-04-30 MED ORDER — CEPHALEXIN 500 MG PO CAPS: 500.0000 mg | ORAL_CAPSULE | Freq: Three times a day (TID) | ORAL | 0 refills | Status: DC

## 2019-04-30 NOTE — Progress Notes (Signed)
   Subjective:  Audio only  Patient ID: Thomas [name], male    DOB: 1945-10-26, 74 y.o.   MRN: [id number]  Cough This is a new problem. Episode onset: 4 -5 days. Treatments tried: otc cough syrup.   Virtual Visit via Telephone Note  I connected with Thomas [name] on 04/30/19 at  1:10 PM EST by telephone and verified that I am speaking with the correct person using two identifiers.  Location: Patient: home Provider: office   I discussed the limitations, risks, security and privacy concerns of performing an evaluation and management service by telephone and the availability of in person appointments. I also discussed with the patient that there may be a patient responsible charge related to this service. The patient expressed understanding and agreed to proceed.   History of Present Illness:    Observations/Objective:   Assessment and Plan:   Follow Up Instructions:    I discussed the assessment and treatment plan with the patient. The patient was provided an opportunity to ask questions and all were answered. The patient agreed with the plan and demonstrated an understanding of the instructions.   The patient was advised to call back or seek an in-person evaluation if the symptoms worsen or if the condition fails to improve as anticipated.  I provided53minutes of non-face-to-face time during this encounter.  Pos hx of smoking   Pos cough   little  Patient gets recurrent bronchitis.  General prescribed antibiotics for.  Has a history of remote smoking.  No true diagnosis of COPD  Has had some potential exposures as far as COVID-19 goes  No fever no chills no shortness of breath good appetite no GI symptoms   Review of Systems  Respiratory: Positive for cough.        Objective:   Physical Exam   Virtual     Assessment & Plan:  Impression bronchitis with flareup probable underlying COPD versus COVID-19 infection.  Discussed.  Antibiotics prescribed.   Covid testing encouraged rationale discussed warning signs discussed

## 2019-05-03 ENCOUNTER — Ambulatory Visit: Payer: Medicare HMO | Attending: Internal Medicine

## 2019-05-03 ENCOUNTER — Other Ambulatory Visit: Payer: Self-pay

## 2019-05-03 DIAGNOSIS — Z20822 Contact with and (suspected) exposure to covid-19: Secondary | ICD-10-CM

## 2019-05-05 ENCOUNTER — Telehealth: Payer: Self-pay

## 2019-05-05 LAB — NOVEL CORONAVIRUS, NAA: SARS-CoV-2, NAA: NOT DETECTED

## 2019-05-05 NOTE — Telephone Encounter (Signed)
Caller given negative result and verbalized understanding  

## 2019-05-12 ENCOUNTER — Encounter: Payer: Self-pay | Admitting: *Deleted

## 2019-05-17 ENCOUNTER — Telehealth: Payer: Self-pay | Admitting: Family Medicine

## 2019-05-17 NOTE — Telephone Encounter (Signed)
Pt is requesting refill on diphenoxylate-atropine (LOMOTIL) 2.5-0.025 MG tablet please send to [id number] - [place], [place] [address]

## 2019-05-17 NOTE — Telephone Encounter (Signed)
35 tablets with 3 refills sent in on 03/08/2019. Contacted [facility] to see if he had any refills and pt has one refill. Asked could they go ahead and fill. Tried to contact pt but line was busy, will try again later.

## 2019-05-18 NOTE — Telephone Encounter (Signed)
Pt.notified

## 2019-05-25 ENCOUNTER — Encounter: Payer: Self-pay | Admitting: Gastroenterology

## 2019-05-25 ENCOUNTER — Other Ambulatory Visit (INDEPENDENT_AMBULATORY_CARE_PROVIDER_SITE_OTHER): Payer: Medicare HMO

## 2019-05-25 ENCOUNTER — Ambulatory Visit (INDEPENDENT_AMBULATORY_CARE_PROVIDER_SITE_OTHER): Payer: Medicare HMO | Admitting: Gastroenterology

## 2019-05-25 VITALS — BP 128/80 | HR 72 | Temp 98.1°F | Ht 68.0 in | Wt 188.6 lb

## 2019-05-25 DIAGNOSIS — R197 Diarrhea, unspecified: Secondary | ICD-10-CM

## 2019-05-25 LAB — BASIC METABOLIC PANEL
BUN: 15 mg/dL (ref 6–23)
CO2: 27 mEq/L (ref 19–32)
Calcium: 9 mg/dL (ref 8.4–10.5)
Chloride: 104 mEq/L (ref 96–112)
Creatinine, Ser: 1.02 mg/dL (ref 0.40–1.50)
GFR: 71.18 mL/min (ref >60.00)
Glucose, Bld: 89 mg/dL (ref 70–99)
Potassium: 4.3 mEq/L (ref 3.5–5.1)
Sodium: 137 mEq/L (ref 135–145)

## 2019-05-25 MED ORDER — COLESTIPOL HCL 1 G PO TABS: 2.0000 g | ORAL_TABLET | Freq: Two times a day (BID) | ORAL | 0 refills | Status: DC

## 2019-05-25 NOTE — Progress Notes (Signed)
Referring Provider: [name], MD Primary Care Physician:  [name], MD  Chief complaint: Diarrhea  IMPRESSION:  Chronic, unexplained diarrhea with recent exacerbation    - C diff, stool culture, and O&P negative 08/19/18    - Fecal calprotectin less than 16 09/25/2018    - ESR and sedimentation rate normal    - Right and left sided colon biopsies negative for microscopic colitis 09/29/2018    - Duodenal biopsies negative for celiac disease 09/29/2018    - TSH 7.360 03/15/2019 History of colon polyp     - "polyp" that was "polypoid colorectal mucosa" on colonoscopy in 2009    - no polyps on colonoscopy in 2015    - Rectal tubular adenoma on colonoscopy 09/29/2018    - Consider surveillance colonoscopy in 7 years if clinically appropriate at that time Hypothyroidism with a recent TSH of 7.360 Iron deficiency anemia 2015 attributed to reflux and stricture [name])    - No iron deficiency or anemia present on 02/2019 labs Benign, distal esophageal stricture associated with reflux    - biopsies showed reflux 2015    - dilated with 34m Savory dilatory 2015 ([name]) Small internal hemorrhoids on colonoscopy 2015 3cm hiatal hernia  No known family history of colon cancer or polyps   Persistent chronic diarrhea despite Metamucil and Lomotil.  He has having some nocturnal symptoms.  He has a normal appetite and has recently gained weight.  Fecal calprotectin, ESR, and CRP were normal. Giardia, C. difficile, and stool culture testing was negative. EGD with duodenal biopsies showed no celiac. Colonoscopy with random biopsies was normal.  He continues to have profound diarrhea.  His TSH is abnormal.  No obvious medications that would cause this diarrhea.  He has a history of iron deficiency anemia however labs from November showed no ongoing iron deficiency or anemia.  We reviewed the tubular adenoma that was removed on his colonoscopy in June.  Consider surveillance colonoscopy in 7  years if it is clinically appropriate at that time.     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  The interval history is obtained to the patient and review of his electronic health record. He has a history of prostate cancer treated with 1-125 interstitial implant, asthma, and dyslipidemia.   I originally met him in June.  At that time he was reporting intermittent diarrhea for years that he has been able to control with Peptobismal PRN. He previously attributed it to stress. However, worse over the last couple of months and he is not responding to Peptobismal any more. At its worst he has had between 7 and 8 loose, explosive, watery bowel movements daily.  Associated urgency.  No blood or mucous. No longer responding to Peptobismal. Not getting better with increased fiber.  There is no associated abdominal pain or cramping. No blood or mucous in the stool. No systemic complaints. Weight stable. Appetite good. No change with Lactaid and  avoiding dairy.  No identified exacerbating or relieving features.   Testing has included: Stool studies 08/19/2018 were negative for C. difficile, ova and parasites, Salmonella, Shigella, Campylobacter, E. Coli.  09/25/18: Fecal calprotectin <16, CRP less than 1, ESR 11 Labs 03/15/19: Iron 91, ferritin 52, iron saturation 27, hemoglobin 15.3, MCV 94, normal liver enzymes, TSH 7.360 There is been no recent abdominal imaging.   Endoscopic  evaluation 09/29/18: Colonoscopy: - Hemorrhoids found on perianal exam. Small internal hemorrhoids are also present. - Diverticulosis in the left colon. - One 2 mm polyp in the rectum, removed with a cold snare. Resected and retrieved. - The entire examined colon is normal. Biopsied. - The examination was otherwise normal on direct and retroflexion views. EGD: - Normal esophagus. - Large hiatal hernia. - Gastritis. Biopsied. - Normal examined duodenum except for a medium sized lipoma. Biopsied. Pathology results related to the procedures:  1. Surgical [P], duodenal - PEPTIC DUODENITIS - NO DYSPLASIA OR MALIGNANCY IDENTIFIED 2. Surgical [P], gastric - BENIGN GASTRIC MUCOSA - NO H. PYLORI, INTESTINAL METAPLASIA OR MALIGNANCY IDENTIFIED 3. Surgical [P], random sites - BENIGN COLONIC MUCOSA - NO ACTIVE INFLAMMATION OR EVIDENCE OF MICROSCOPIC COLITIS - NO HIGH GRADE DYSPLASIA OR MALIGNANCY IDENTIFIED 4. Surgical [P], colon, rectum, polyp - TUBULAR ADENOMA (1 OF 1 FRAGMENTS) - NO HIGH GRADE DYSPLASIA OR MALIGNANCY IDENTIFIED  He returns have 4-6 watery, explosive BM daily with associated urgency despite Metamucil BID to TID. A trial of Lomotil has provided minimal relief.   Having some blood related to hemorrhoids. Intermittent mucous. No gas, bloating, eructation. No abdominal pain.  Nocturnal symptoms has happened once or twice since the colonoscopy. Appetite is good. Has added daily bran.  Has gained weight since he stopped working 3 months ago due to [name]. Has gained 9 pounds.  No identified food triggers including sugar, dairy, gluten, spicy foods, or caffeine.  Past Medical History:  Diagnosis Date  . Allergy    trees and plants  . Anemia    IDA  . Anxiety   . Asthma 1998  . Diverticulitis   . Elevated PSA   . GERD (gastroesophageal reflux disease)   . Hyperlipidemia   . Hypothyroidism 03/16/2019  . Prostate cancer [facility])     Past Surgical History:  Procedure  Laterality Date  . HERNIA REPAIR  1999  . prostate cancer    . TRANSURETHRAL RESECTION OF PROSTATE    . VASECTOMY      Current Outpatient Medications  Medication Sig Dispense Refill  . albuterol (VENTOLIN HFA) 108 (90 Base) MCG/ACT inhaler TAKE 2 PUFFS BY MOUTH EVERY 6 HOURS AS NEEDED FOR WHEEZE 18 g 3  . ALPRAZolam (XANAX) 1 MG tablet 1 qhs prn 30 tablet 5  . Ascorbic Acid (VITAMIN C) 100 MG tablet Take 100 mg by mouth daily.    . cephALEXin (KEFLEX) 500 MG capsule Take 1 capsule (500 mg total) by mouth 3 (three) times daily. 30 capsule 0  . cetirizine (ZYRTEC) 10 MG tablet Take 10 mg by mouth daily.    Marland Kitchen co-enzyme Q-10 50 MG capsule Take 50 mg by mouth daily.    . diphenhydrAMINE (BENADRYL) 25 MG tablet Take 25 mg by mouth every 6 (six) hours as needed.    . diphenoxylate-atropine (LOMOTIL) 2.5-0.025 MG tablet 1 pill  3 times daily as needed diarrhea 35 tablet 3  . ferrous sulfate 325 (65 FE) MG tablet Take 325 mg by mouth daily with breakfast.    . Garlic 025  MG TABS Take by mouth.    . levothyroxine (SYNTHROID) 25 MCG tablet TAKE 1 TABLET BY MOUTH DAILY BEFORE BREAKFAST. 90 tablet 0  . Melatonin 1 MG TABS Take by mouth.    . Multiple Vitamins-Minerals (MULTIVITAMIN WITH MINERALS) tablet Take 1 tablet by mouth daily.    . niacin 100 MG tablet Take 100 mg by mouth at bedtime.    . Omega-3 Fatty Acids (FISH OIL) 1000 MG CAPS Take by mouth.    . Red Yeast Rice Extract (RED YEAST RICE PO) Take by mouth.    . silodosin (RAPAFLO) 4 MG CAPS capsule Take 4 mg by mouth daily with breakfast.     No current facility-administered medications for this visit.    Allergies as of 05/25/2019 - Review Complete 03/08/2019  Allergen Reaction Noted  . Augmentin [amoxicillin-pot clavulanate]  04/28/2015  . Ciprofloxacin  06/30/2013  . Nsaids Other (See Comments) 05/05/2014    Family History  Problem Relation Age of Onset  . Heart disease Father   . Hypertension Brother   . Diabetes Brother      Social History   Socioeconomic History  . Marital status: Married    Spouse name: Not on file  . Number of children: Not on file  . Years of education: Not on file  . Highest education level: Not on file  Occupational History  . Not on file  Tobacco Use  . Smoking status: Former Smoker    Types: Cigarettes    Quit date: 04/22/1982    Years since quitting: 37.1  . Smokeless tobacco: Never Used  Substance and Sexual Activity  . Alcohol use: Not Currently  . Drug use: Not Currently  . Sexual activity: Not on file  Other Topics Concern  . Not on file  Social History Narrative  . Not on file   Social Determinants of Health   Financial Resource Strain:   . Difficulty of Paying Living Expenses: Not on file  Food Insecurity:   . Worried About Charity fundraiser in the Last Year: Not on file  . Ran Out of Food in the Last Year: Not on file  Transportation Needs:   . Lack of Transportation (Medical): Not on file  . Lack of Transportation (Non-Medical): Not on file  Physical Activity:   . Days of Exercise per Week: Not on file  . Minutes of Exercise per Session: Not on file  Stress:   . Feeling of Stress : Not on file  Social Connections:   . Frequency of Communication with Friends and Family: Not on file  . Frequency of Social Gatherings with Friends and Family: Not on file  . Attends Religious Services: Not on file  . Active Member of Clubs or Organizations: Not on file  . Attends Archivist Meetings: Not on file  . Marital Status: Not on file  Intimate Partner Violence:   . Fear of Current or Ex-Partner: Not on file  . Emotionally Abused: Not on file  . Physically Abused: Not on file  . Sexually Abused: Not on file    Review of Systems: ALL ROS discussed and all others negative except listed in HPI.  Physical Exam: General: in no acute distress Neuro: Alert and appropriate Psych: Normal affect and normal insight   Lahna Nath L. Tarri Glenn, MD,  MPH [facility] 05/25/2019, 11:20 AM

## 2019-05-25 NOTE — Patient Instructions (Addendum)
Your provider has requested that you go to the basement level for lab work before leaving today. Press "B" on the elevator. The lab is located at the first door on the left as you exit the elevator.  We have sent the following medications to your pharmacy for you to pick up at your convenience: Colestipol 2 grams daily for one week, increase to twice a day if no improvement  I value your feedback and thank you for entrusting [place] with your care. If you get a [facility] patient survey, I would appreciate you taking the time to let us know about your experience today. Thank you!   Due to recent changes in healthcare laws, you may see the results of your imaging and laboratory studies on MyChart before your provider has had a chance to review them.  We understand that in some cases there may be results that are confusing or concerning to you. Not all laboratory results come back in the same time frame and the provider may be waiting for multiple results in order to interpret others.  Please give [place] 48 hours in order for your provider to thoroughly review all the results before contacting the office for clarification of your results.

## 2019-05-31 ENCOUNTER — Other Ambulatory Visit: Payer: Self-pay

## 2019-05-31 ENCOUNTER — Encounter: Payer: Self-pay | Admitting: Family Medicine

## 2019-05-31 ENCOUNTER — Encounter (HOSPITAL_COMMUNITY): Payer: Self-pay | Admitting: Emergency Medicine

## 2019-05-31 ENCOUNTER — Ambulatory Visit (INDEPENDENT_AMBULATORY_CARE_PROVIDER_SITE_OTHER): Payer: Medicare HMO | Admitting: Family Medicine

## 2019-05-31 ENCOUNTER — Emergency Department (HOSPITAL_COMMUNITY): Payer: Medicare HMO

## 2019-05-31 ENCOUNTER — Telehealth: Payer: Self-pay | Admitting: *Deleted

## 2019-05-31 ENCOUNTER — Emergency Department (HOSPITAL_COMMUNITY)
Admission: EM | Admit: 2019-05-31 | Discharge: 2019-06-01 | Disposition: A | Discharge status: Home / Self Care | Payer: Medicare HMO | Attending: Emergency Medicine | Admitting: Emergency Medicine

## 2019-05-31 VITALS — BP 138/80 | Temp 98.4°F | Ht 68.0 in | Wt 188.4 lb

## 2019-05-31 DIAGNOSIS — Z87891 Personal history of nicotine dependence: Secondary | ICD-10-CM | POA: Insufficient documentation

## 2019-05-31 DIAGNOSIS — J45909 Unspecified asthma, uncomplicated: Secondary | ICD-10-CM | POA: Insufficient documentation

## 2019-05-31 DIAGNOSIS — R079 Chest pain, unspecified: Secondary | ICD-10-CM

## 2019-05-31 DIAGNOSIS — M79602 Pain in left arm: Secondary | ICD-10-CM

## 2019-05-31 DIAGNOSIS — F419 Anxiety disorder, unspecified: Secondary | ICD-10-CM | POA: Insufficient documentation

## 2019-05-31 DIAGNOSIS — R072 Precordial pain: Secondary | ICD-10-CM | POA: Insufficient documentation

## 2019-05-31 DIAGNOSIS — R110 Nausea: Secondary | ICD-10-CM

## 2019-05-31 DIAGNOSIS — T466X5A Adverse effect of antihyperlipidemic and antiarteriosclerotic drugs, initial encounter: Secondary | ICD-10-CM | POA: Diagnosis not present

## 2019-05-31 DIAGNOSIS — E039 Hypothyroidism, unspecified: Secondary | ICD-10-CM | POA: Diagnosis not present

## 2019-05-31 DIAGNOSIS — T50905A Adverse effect of unspecified drugs, medicaments and biological substances, initial encounter: Secondary | ICD-10-CM

## 2019-05-31 DIAGNOSIS — R000 Tachycardia, unspecified: Secondary | ICD-10-CM | POA: Diagnosis not present

## 2019-05-31 DIAGNOSIS — R69 Illness, unspecified: Secondary | ICD-10-CM | POA: Diagnosis not present

## 2019-05-31 LAB — BASIC METABOLIC PANEL
Anion gap: 7 (ref 5–15)
BUN: 16 mg/dL (ref 8–23)
CO2: 27 mmol/L (ref 22–32)
Calcium: 8.8 mg/dL — ABNORMAL LOW (ref 8.9–10.3)
Chloride: 108 mmol/L (ref 98–111)
Creatinine, Ser: 1.05 mg/dL (ref 0.61–1.24)
GFR calc Af Amer: >60 mL/min (ref >60)
GFR calc non Af Amer: >60 mL/min (ref >60)
Glucose, Bld: 95 mg/dL (ref 70–99)
Potassium: 4.4 mmol/L (ref 3.5–5.1)
Sodium: 142 mmol/L (ref 135–145)

## 2019-05-31 LAB — CBC
HCT: 46.2 % (ref 39.0–52.0)
Hemoglobin: 15.1 g/dL (ref 13.0–17.0)
MCH: 31.5 pg (ref 26.0–34.0)
MCHC: 32.7 g/dL (ref 30.0–36.0)
MCV: 96.5 fL (ref 80.0–100.0)
Platelets: 225 10*3/uL (ref 150–400)
RBC: 4.79 MIL/uL (ref 4.22–5.81)
RDW: 12.6 % (ref 11.5–15.5)
WBC: 7.1 10*3/uL (ref 4.0–10.5)
nRBC: 0 % (ref 0.0–0.2)

## 2019-05-31 LAB — TROPONIN I (HIGH SENSITIVITY): Troponin I (High Sensitivity): <2 ng/L (ref <18)

## 2019-05-31 MED ORDER — SODIUM CHLORIDE 0.9% FLUSH: 3.0000 mL | Freq: Once | INTRAVENOUS | Status: DC

## 2019-05-31 NOTE — Telephone Encounter (Signed)
Patient called to say he was seen earlier for a chest tightness and left arm pain and he told doctor that he wasn't having any pain but he now notices a pressure /pulling pain in his chest that is not severe but noticeable and wanted the doctor to know

## 2019-05-31 NOTE — Telephone Encounter (Signed)
I believe that that is the best course of action.  It is impossible to rule out the possibility of coronary artery disease without some further testing.  If everything in the ER comes out looking good we can always help arrange for outpatient follow-up

## 2019-05-31 NOTE — ED Triage Notes (Signed)
Seen by PCP at 1130 this am, for possible reaction to med, however, pt did not tell PCP that he was having chest pain and let arm pain until pt returned home. Pt says he has been under stress with family member.

## 2019-05-31 NOTE — Telephone Encounter (Signed)
If the patient is currently having chest tightness pressure or pain he should go to the ER for further evaluation If it has resolved then I recommend a cardiology consult urgent

## 2019-05-31 NOTE — Telephone Encounter (Signed)
Patient notified that he should got to the ER for evaluation and treatment. Patient agreed and stated he would go to the ER for evaluation

## 2019-05-31 NOTE — Progress Notes (Signed)
   Subjective:    Patient ID: Thomas [name], male    DOB: 09/18/1945, 74 y.o.   MRN: [id number]  HPIpt states he started colestipol about one week ago. States he started having nausea, indigestion, and muscle pain in left arm.  Patient came in initially complaining of left arm pain and chest discomfort therefore he was worked into the schedule Upon further questioning he relates a lot of abdominal discomfort nausea gas belching ever since starting the new medication from gastroenterology.  He states at times he gets some indigestion.  He denies chest tightness pressure or pain with walking denies any chest tightness denies substernal discomfort or angina symptoms.  Denies shortness of breath wheezing or difficulty breathing.  States some intermittent left arm pain in the wrist region and forearm region not associated with the abdominal symptoms that come and go on their own. Patient does have some risk factors for heart disease but no known active heart disease at this moment Patient still has diarrhea 2-3 times per day but not as bad as it once was Review of Systems  Constitutional: Negative for activity change.  HENT: Negative for congestion and rhinorrhea.   Respiratory: Negative for cough and shortness of breath.   Cardiovascular: Negative for chest pain, palpitations and leg swelling.  Gastrointestinal: Positive for nausea. Negative for abdominal distention, abdominal pain, diarrhea and vomiting.       Bloating and indigestion in the upper abdomen  Genitourinary: Negative for dysuria and hematuria.  Neurological: Negative for weakness and headaches.  Psychiatric/Behavioral: Negative for behavioral problems and confusion.       Objective:   Physical Exam Lungs are clear respiratory rate normal heart is regular no murmurs extremities no edema skin warm dry neurologic grossly normal His arms find no evidence of any type of bruising or swelling. EKG looks good Compared to previous EKG  no changes from 2015     Assessment & Plan:  I believe his indigestion is related to the medication EKG looks good symptoms of angina I do not feel the left arm pain he is having down toward the wrist is due to his heart He may take Tylenol as needed  As for the gastric side effects of the medication I encouraged him to reduce the dose to 1 tablet twice daily and then follow-up with gastroenterology in March we will send a copy of this to the gastroenterologist

## 2019-05-31 NOTE — ED Provider Notes (Signed)
[facility] EMERGENCY DEPARTMENT Provider Note   CSN: [id number] Arrival date & time: 05/31/19  1722     History Chief Complaint  Patient presents with  . Chest Pain    Thomas [name] is a 74 y.o. male.  HPI      Thomas [name] is a 74 y.o. male who presents to the Emergency Department complaining of possible medication reaction and increased stress.  He also states that he became upset one week ago after a verbal altercation   He states that he has been having abdominal pain and diarrhea for some time.  He was seen by his gastroenterologist and started on Colestid.  He states since taking this medication he has been having dull pain from the lower middle of his chest to his abdomen.   His abdominal pain has been associated with crampy abdominal pain that he associates with his diarrhea.  He states the diarrhea has subsided since starting the Colestid.  He was seen by his primary care provider today and he reduced the dosage.    Past Medical History:  Diagnosis Date  . Allergy    trees and plants  . Anemia    IDA  . Anxiety   . Asthma 1998  . Diverticulitis   . Elevated PSA   . GERD (gastroesophageal reflux disease)   . Hyperlipidemia   . Hypothyroidism 03/16/2019  . Prostate cancer [facility])     Patient Active Problem List   Diagnosis Date Noted  . Hypothyroidism 03/16/2019  . GERD (gastroesophageal reflux disease) 11/29/2015  . Insomnia 11/29/2015  . Prostate cancer ([place]) 12/30/2014  . Hyperlipidemia 12/30/2014  . Anemia 07/19/2013  . Iron deficiency 07/19/2013  . LUQ abdominal pain 07/19/2013  . Rectal bleeding 07/19/2013    Past Surgical History:  Procedure Laterality Date  . HERNIA REPAIR  1999  . prostate cancer    . TRANSURETHRAL RESECTION OF PROSTATE    . VASECTOMY         Family History  Problem Relation Age of Onset  . Heart disease Father   . Hypertension Brother   . Diabetes Brother     Social History   Tobacco Use  . Smoking  status: Former Smoker    Types: Cigarettes    Quit date: 04/22/1982    Years since quitting: 37.1  . Smokeless tobacco: Never Used  Substance Use Topics  . Alcohol use: Not Currently  . Drug use: Not Currently    Home Medications Prior to Admission medications   Medication Sig Start Date End Date Taking? Authorizing Provider  albuterol (VENTOLIN HFA) 108 (90 Base) MCG/ACT inhaler TAKE 2 PUFFS BY MOUTH EVERY 6 HOURS AS NEEDED FOR WHEEZE 12/02/18   [name], MD  ALPRAZolam Duanne Moron) 1 MG tablet 1 qhs prn 12/31/17   [name], Elayne Snare, MD  Ascorbic Acid (VITAMIN C) 100 MG tablet Take 100 mg by mouth daily.    [provider]  cetirizine (ZYRTEC) 10 MG tablet Take 10 mg by mouth daily.    [provider]  co-enzyme Q-10 50 MG capsule Take 50 mg by mouth daily.    [provider]  colestipol (COLESTID) 1 g tablet Take 2 tablets (2 g total) by mouth 2 (two) times daily. 05/25/19 06/24/19  Thornton Park, MD  diphenhydrAMINE (BENADRYL) 25 MG tablet Take 25 mg by mouth every 6 (six) hours as needed.    [provider]  diphenoxylate-atropine (LOMOTIL) 2.5-0.025 MG tablet 1 pill  3 times  daily as needed diarrhea 03/08/19   [name], MD  ferrous sulfate 325 (65 FE) MG tablet Take 325 mg by mouth daily with breakfast.    [provider]  Garlic 123XX123 MG TABS Take by mouth.    [provider]  levothyroxine (SYNTHROID) 25 MCG tablet TAKE 1 TABLET BY MOUTH DAILY BEFORE BREAKFAST. 03/31/19   [name], MD  Melatonin 1 MG TABS Take by mouth.    [provider]  Multiple Vitamins-Minerals (MULTIVITAMIN WITH MINERALS) tablet Take 1 tablet by mouth daily.    [provider]  niacin 100 MG tablet Take 100 mg by mouth at bedtime.    [provider]  Omega-3 Fatty Acids (FISH OIL) 1000 MG CAPS Take by mouth.    [provider]  Red Yeast Rice Extract (RED YEAST RICE PO) Take by mouth.    [provider]    silodosin (RAPAFLO) 4 MG CAPS capsule Take 4 mg by mouth daily with breakfast.    [provider]    Allergies    Augmentin [amoxicillin-pot clavulanate], Ciprofloxacin, and Nsaids  Review of Systems   Review of Systems  Constitutional: Negative for chills, diaphoresis, fatigue and fever.  Respiratory: Negative for cough, shortness of breath and wheezing.   Cardiovascular: Positive for chest pain. Negative for palpitations.  Gastrointestinal: Positive for abdominal pain, diarrhea and nausea. Negative for blood in stool and vomiting.  Genitourinary: Negative for dysuria, flank pain and hematuria.  Musculoskeletal: Negative for arthralgias, back pain, myalgias, neck pain and neck stiffness.  Skin: Negative for rash.  Neurological: Negative for dizziness, weakness and numbness.  Hematological: Does not bruise/bleed easily.    Physical Exam Updated Vital Signs BP (!) 157/94   Pulse 93   Temp 98.2 F (36.8 C) (Oral)   Resp 19   Ht 5\' 6"  (1.676 m)   Wt 85.3 kg   SpO2 98%   BMI 30.34 kg/m   Physical Exam Vitals and nursing note reviewed.  Constitutional:      Appearance: Normal appearance. He is well-developed. He is not ill-appearing or toxic-appearing.     Comments: Pt appears mildly anxioous  HENT:     Head: Normocephalic.     Mouth/Throat:     Mouth: Mucous membranes are moist.  Neck:     Thyroid: No thyromegaly.     Meningeal: Kernig's sign absent.  Cardiovascular:     Rate and Rhythm: Normal rate and regular rhythm.     Pulses: Normal pulses.  Pulmonary:     Effort: Pulmonary effort is normal.     Breath sounds: Normal breath sounds. No wheezing.  Chest:     Chest wall: Tenderness (ttp at the xyphoid process) present.  Abdominal:     General: There is no distension.     Palpations: Abdomen is soft.     Tenderness: There is no abdominal tenderness ( ). There is no guarding or rebound.  Musculoskeletal:        General: Normal range of motion.      Cervical back: Normal range of motion and neck supple.     Right lower leg: No edema.     Left lower leg: No edema.  Skin:    General: Skin is warm.     Capillary Refill: Capillary refill takes less than 2 seconds.     Findings: No rash.  Neurological:     General: No focal deficit present.     Mental Status: He is alert.  Sensory: No sensory deficit.     Motor: No weakness.     ED Results / Procedures / Treatments   Labs (all labs ordered are listed, but only abnormal results are displayed) Labs Reviewed  BASIC METABOLIC PANEL - Abnormal; Notable for the following components:      Result Value   Calcium 8.8 (*)    All other components within normal limits  CBC  TROPONIN I (HIGH SENSITIVITY)  TROPONIN I (HIGH SENSITIVITY)    EKG EKG Interpretation  Date/Time:  Monday May 31 2019 17:41:09 EST Ventricular Rate:  103 PR Interval:  142 QRS Duration: 76 QT Interval:  336 QTC Calculation: 440 R Axis:   -8 Text Interpretation: Sinus tachycardia with Premature atrial complexes Left ventricular hypertrophy Cannot rule out Septal infarct , age undetermined Abnormal ECG No significant change since last tracing Confirmed by Theotis Burrow [phone]) on 06/01/2019 9:40:59 AM  EKG reviewed by Dr. Roderic Palau  Radiology DG Chest 2 View  Result Date: 05/31/2019 CLINICAL DATA:  Medication reaction, chest pain and left arm pain EXAM: CHEST - 2 VIEW COMPARISON:  06/28/2004 FINDINGS: Frontal and lateral views of the chest demonstrates a moderate hiatal hernia. Cardiac silhouette is unremarkable. No acute airspace disease, effusion, or pneumothorax. No acute bony abnormalities. IMPRESSION: 1. Moderate hiatal hernia. 2. No acute intrathoracic process. Electronically Signed   By: Randa Ngo M.D.   On: 05/31/2019 18:43    Procedures Procedures (including critical care time)  Medications Ordered in ED Medications  sodium chloride flush (NS) 0.9 % injection 3 mL (3 mLs Intravenous Not  Given 05/31/19 2210)    ED Course  I have reviewed the triage vital signs and the nursing notes.  Pertinent labs & imaging results that were available during my care of the patient were reviewed by me and considered in my medical decision making (see chart for details).    MDM Rules/Calculators/A&P                      Patient with reported lower chest pain, nausea and reported chronic diarrhea. He is well-appearing. No clinical signs of dehydration.  Doubt ACS, or dissection since sx's have been persisting for one week.   Vital signs are reassuring.    Symptoms have been present for 1 week and reportedly developed after taking Colestid.  He states that he is feeling better and ready to be discharged.   Patient also seen by Dr. Roderic Palau and care plan discussed.  Pt agrees to arrange f/u with GI as sx's possibly related to medication intolerance. Strict return precautions also discussed.    Final Clinical Impression(s) / ED Diagnoses Final diagnoses:  Precordial pain  Adverse effect of drug, initial encounter  Anxiety    Rx / DC Orders ED Discharge Orders    None       Kem Parkinson, PA-C 06/03/19 1639    Milton Ferguson, MD 06/04/19 1507

## 2019-06-01 LAB — TSH: TSH: 5.45 u[IU]/mL — ABNORMAL HIGH (ref 0.450–4.500)

## 2019-06-01 LAB — T4, FREE: Free T4: 0.9 ng/dL (ref 0.82–1.77)

## 2019-06-01 LAB — TROPONIN I (HIGH SENSITIVITY): Troponin I (High Sensitivity): <2 ng/L (ref <18)

## 2019-06-01 NOTE — Discharge Instructions (Addendum)
Follow-up with Dr. [name] this week for recheck.  Return to the emergency department for any worsening symptoms.

## 2019-06-01 NOTE — ED Notes (Signed)
Pt ambulatory to waiting room. Pt verbalized understanding of discharge instructions.   

## 2019-06-03 ENCOUNTER — Encounter: Payer: Self-pay | Admitting: Family Medicine

## 2019-06-03 ENCOUNTER — Other Ambulatory Visit: Payer: Self-pay | Admitting: Family Medicine

## 2019-06-03 DIAGNOSIS — E039 Hypothyroidism, unspecified: Secondary | ICD-10-CM

## 2019-06-03 MED ORDER — LEVOTHYROXINE SODIUM 50 MCG PO TABS: 50.0000 ug | ORAL_TABLET | Freq: Every day | ORAL | 4 refills | Status: DC

## 2019-06-14 ENCOUNTER — Other Ambulatory Visit: Payer: Self-pay

## 2019-06-14 ENCOUNTER — Inpatient Hospital Stay: Payer: Medicare HMO | Attending: Radiation Oncology

## 2019-06-14 ENCOUNTER — Other Ambulatory Visit: Payer: Self-pay | Admitting: *Deleted

## 2019-06-14 DIAGNOSIS — C61 Malignant neoplasm of prostate: Secondary | ICD-10-CM | POA: Insufficient documentation

## 2019-06-14 LAB — PSA: Prostatic Specific Antigen: 5.28 ng/mL — ABNORMAL HIGH (ref 0.00–4.00)

## 2019-06-16 ENCOUNTER — Other Ambulatory Visit: Payer: Self-pay | Admitting: Gastroenterology

## 2019-06-21 ENCOUNTER — Ambulatory Visit: Payer: Medicare HMO | Admitting: Radiation Oncology

## 2019-06-22 ENCOUNTER — Ambulatory Visit (INDEPENDENT_AMBULATORY_CARE_PROVIDER_SITE_OTHER): Payer: Medicare HMO | Admitting: Gastroenterology

## 2019-06-22 ENCOUNTER — Other Ambulatory Visit: Payer: Self-pay

## 2019-06-22 ENCOUNTER — Encounter: Payer: Self-pay | Admitting: Gastroenterology

## 2019-06-22 DIAGNOSIS — R197 Diarrhea, unspecified: Secondary | ICD-10-CM | POA: Diagnosis not present

## 2019-06-22 DIAGNOSIS — A09 Infectious gastroenteritis and colitis, unspecified: Secondary | ICD-10-CM

## 2019-06-22 MED ORDER — RIFAXIMIN 550 MG PO TABS: 550.0000 mg | ORAL_TABLET | Freq: Three times a day (TID) | ORAL | 0 refills | Status: DC

## 2019-06-22 NOTE — Patient Instructions (Signed)
Continue daily psyllium for stool bulking and Lomotil as previously prescribed.   Continue colestipol 1 gram twice a day.   We have sent your demographic information and a prescription for Xifaxan to [facility]. This pharmacy is able to get medication approved through insurance and get you the lowest copay possible. If you have not heard from them within 1 week, please call our office at [phone] to let us know.  Your provider has requested that you go to the basement level for lab work before leaving today. Press "B" on the elevator. The lab is located at the first door on the left as you exit the elevator.  Follow up in 4 weeks.

## 2019-06-22 NOTE — Progress Notes (Signed)
Referring Provider: [name], MD Primary Care Physician:  [name], MD  Tele-visit due to COVID-19 pandemic Patient requested visit virtually, consented to the virtual encounter via audio enabled telemedicine application (Doximity, converted to phone call given weak internet connection) Contact made at: 11:30 06/22/2019 Patient verified by name and date of birth Location of patient: Home Location provider: [place] [facility] Names of persons participating: Me, patient, Tinnie Gens CMA Time spent on telehealth visit: 28 minutes I discussed the limitations of evaluation and management by telemedicine. The patient expressed understanding and agreed to proceed.  Chief complaint: Diarrhea  IMPRESSION:  Chronic, unexplained diarrhea with recent exacerbation    - C diff, stool culture, and O&P negative 08/19/18    - Fecal calprotectin less than 16 09/25/2018    - ESR and sedimentation rate normal    - Right and left sided colon biopsies negative for microscopic colitis 09/29/2018    - Duodenal biopsies negative for celiac disease 09/29/2018    - TSH 7.360 03/15/2019 History of colon polyp     - "polyp" that was "polypoid colorectal mucosa" on colonoscopy in 2009    - no polyps on colonoscopy in 2015    - Rectal tubular adenoma on colonoscopy 09/29/2018    - Consider surveillance colonoscopy in 7 years if clinically appropriate at that time Hypothyroidism with a recent TSH of 7.360 Iron deficiency anemia 2015 attributed to reflux and stricture [name])    - No iron deficiency or anemia present on 02/2019 labs Benign, distal esophageal stricture associated with reflux    - biopsies showed reflux 2015    - dilated with 7m Savory dilatory 2015 ([name]) Small internal hemorrhoids on colonoscopy 2015 3cm hiatal hernia  No known family history of colon cancer or polyps   Persistent chronic diarrhea despite Metamucil and Lomotil and a trial of colestipol.  He has a normal  appetite and has recently gained weight.  Fecal calprotectin, ESR, and CRP were normal. Giardia, C. difficile, and stool culture testing was negative. EGD with duodenal biopsies showed no celiac. Colonoscopy with random biopsies was normal.  His TSH is abnormal.  No obvious medications that would cause this diarrhea.      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 He was last seen 05/24/2009.  The interval history is obtained to the patient and review of his electronic health record. He has a history of prostate cancer treated with 1-125 interstitial implant, asthma, and dyslipidemia.  He completed his Covid vaccines and is back to working one day a week.   I originally met him in June.  At that time he was reporting intermittent diarrhea for years that he has been able to control with Peptobismal PRN. He previously attributed it to stress. However, worse over the last couple of months and he is not responding to Peptobismal any more. At its worst he has had between 7 and 8 loose, explosive, watery bowel movements daily.  Associated urgency.  No blood or mucous. No longer responding to Peptobismal. Not getting better  with increased fiber.  There is no associated abdominal pain or cramping. No blood or mucous in the stool. No systemic complaints. Weight stable. Appetite good. No change with Lactaid and avoiding dairy.   No identified food triggers including sugar, dairy, gluten, spicy foods, or caffeine.  Testing has included: Stool studies 08/19/2018 were negative for C. difficile, ova and  parasites, Salmonella, Shigella, Campylobacter, E. Coli.  09/25/18: Fecal calprotectin <16, CRP less than 1, ESR 11 Labs 03/15/19: Iron 91, ferritin 52, iron saturation 27, hemoglobin 15.3, MCV 94, normal liver enzymes, TSH 7.360 There is been no recent abdominal imaging.  Endoscopic evaluation 09/29/18: Colonoscopy: internal and external hemorrhoids, left-sided diverticulosis, small rectal tubular aenoma, normal random colon biopsies. EGD: large hiatal hernia, duodenal lipoma, peptic duodenitis, normal gastric biopsies  He returned in February 2021 having 4-6 watery, explosive BM daily with associated urgency despite Metamucil BID to TID. Nocturnal symptoms were decreasing.  A trial of Lomotil has provided minimal relief.  He had a good appetite and had gained 9 pounds during Covid.    Having some blood related to hemorrhoids. Intermittent mucous. No gas, bloating, eructation. No abdominal pain.  Nocturnal symptoms has happened once or twice since the colonoscopy. Appetite is good. Has added daily bran.  Has gained weight since he stopped working 3 months ago due to [name]. Has gained 9 pounds.  No identified food triggers including sugar, dairy, gluten, spicy foods, or caffeine.  Returns in scheduled follow-up after starting Colestipol 2g BID. Dr. [name] reduced his dose to 1g BID due to intolerance to medications with nausea, dyspepsia, and muscle pain in his left arm. Those symptoms have improved on the lower dose of colestipol.  He thinks his diet is contributing to his diarrhea.   Now having 3-4, explosive BM daily with associated gas. Having accidents almost daily. Nocturnal diarrhea.   Continues on Metamucil once or twice daily. Not using Imodium or Lomotil.   Not eating healthy but he is eating. Gained 12 pounds over the last 2-3 months. He has resumed working one day a week since receiving his Covid vaccine.  No new complaints or concerns.   Past Medical History:  Diagnosis  Date  . Allergy    trees and plants  . Anemia    IDA  . Anxiety   . Asthma 1998  . Diverticulitis   . Elevated PSA   . GERD (gastroesophageal reflux disease)   . Hyperlipidemia   . Hypothyroidism 03/16/2019  . Prostate cancer [facility])     Past Surgical History:  Procedure Laterality Date  . HERNIA REPAIR  1999  . prostate cancer    . TRANSURETHRAL RESECTION OF PROSTATE    . VASECTOMY      Current Outpatient Medications  Medication Sig Dispense Refill  . albuterol (VENTOLIN HFA) 108 (90 Base) MCG/ACT inhaler TAKE 2 PUFFS BY MOUTH EVERY 6 HOURS AS NEEDED FOR WHEEZE 18 g 3  . ALPRAZolam (XANAX) 1 MG tablet 1 qhs prn 30 tablet 5  . Ascorbic Acid (VITAMIN C) 100 MG tablet Take 100 mg by mouth daily.    . cetirizine (ZYRTEC) 10 MG tablet Take 10 mg by mouth daily.    Marland Kitchen co-enzyme Q-10 50 MG capsule Take 50 mg by mouth daily.    . colestipol (COLESTID) 1 g tablet TAKE 2 TABLETS (2 G TOTAL) BY MOUTH 2 (TWO) TIMES DAILY. 120 tablet 0  . diphenhydrAMINE (BENADRYL) 25 MG tablet Take 25 mg by mouth every 6 (six) hours as  needed.    . diphenoxylate-atropine (LOMOTIL) 2.5-0.025 MG tablet 1 pill  3 times daily as needed diarrhea 35 tablet 3  . ferrous sulfate 325 (65 FE) MG tablet Take 325 mg by mouth daily with breakfast.    . Garlic 497 MG TABS Take by mouth.    . levothyroxine (SYNTHROID) 25 MCG tablet TAKE 1 TABLET BY MOUTH DAILY BEFORE BREAKFAST. 90 tablet 0  . levothyroxine (SYNTHROID) 50 MCG tablet Take 1 tablet (50 mcg total) by mouth daily. 30 tablet 4  . Melatonin 1 MG TABS Take by mouth.    . Multiple Vitamins-Minerals (MULTIVITAMIN WITH MINERALS) tablet Take 1 tablet by mouth daily.    . niacin 100 MG tablet Take 100 mg by mouth at bedtime.    . Omega-3 Fatty Acids (FISH OIL) 1000 MG CAPS Take by mouth.    . Red Yeast Rice Extract (RED YEAST RICE PO) Take by mouth.    . silodosin (RAPAFLO) 4 MG CAPS capsule Take 4 mg by mouth daily with breakfast.     No current  facility-administered medications for this visit.    Allergies as of 06/22/2019 - Review Complete 05/31/2019  Allergen Reaction Noted  . Augmentin [amoxicillin-pot clavulanate]  04/28/2015  . Ciprofloxacin  06/30/2013  . Nsaids Other (See Comments) 05/05/2014    Family History  Problem Relation Age of Onset  . Heart disease Father   . Hypertension Brother   . Diabetes Brother     Social History   Socioeconomic History  . Marital status: Married    Spouse name: Not on file  . Number of children: Not on file  . Years of education: Not on file  . Highest education level: Not on file  Occupational History  . Not on file  Tobacco Use  . Smoking status: Former Smoker    Types: Cigarettes    Quit date: 04/22/1982    Years since quitting: 37.1  . Smokeless tobacco: Never Used  Substance and Sexual Activity  . Alcohol use: Not Currently  . Drug use: Not Currently  . Sexual activity: Not on file  Other Topics Concern  . Not on file  Social History Narrative  . Not on file   Social Determinants of Health   Financial Resource Strain:   . Difficulty of Paying Living Expenses: Not on file  Food Insecurity:   . Worried About Charity fundraiser in the Last Year: Not on file  . Ran Out of Food in the Last Year: Not on file  Transportation Needs:   . Lack of Transportation (Medical): Not on file  . Lack of Transportation (Non-Medical): Not on file  Physical Activity:   . Days of Exercise per Week: Not on file  . Minutes of Exercise per Session: Not on file  Stress:   . Feeling of Stress : Not on file  Social Connections:   . Frequency of Communication with Friends and Family: Not on file  . Frequency of Social Gatherings with Friends and Family: Not on file  . Attends Religious Services: Not on file  . Active Member of Clubs or Organizations: Not on file  . Attends Archivist Meetings: Not on file  . Marital Status: Not on file  Intimate Partner Violence:   .  Fear of Current or Ex-Partner: Not on file  . Emotionally Abused: Not on file  . Physically Abused: Not on file  . Sexually Abused: Not on file    Physical Exam: General: in  no acute distress Neuro: Alert and appropriate Psych: Normal affect and normal insight   Siegfried Vieth L. Tarri Glenn, MD, MPH [facility] 06/22/2019, 11:40 AM

## 2019-06-23 ENCOUNTER — Other Ambulatory Visit: Payer: Self-pay

## 2019-06-23 ENCOUNTER — Ambulatory Visit
Admission: RE | Admit: 2019-06-23 | Discharge: 2019-06-23 | Disposition: A | Discharge status: Home / Self Care | Payer: Medicare HMO | Source: Ambulatory Visit | Attending: Radiation Oncology | Admitting: Radiation Oncology

## 2019-06-23 ENCOUNTER — Encounter: Payer: Self-pay | Admitting: Radiation Oncology

## 2019-06-23 ENCOUNTER — Other Ambulatory Visit: Payer: Self-pay | Admitting: Gastroenterology

## 2019-06-23 ENCOUNTER — Telehealth: Payer: Self-pay | Admitting: Gastroenterology

## 2019-06-23 ENCOUNTER — Other Ambulatory Visit: Payer: Self-pay | Admitting: *Deleted

## 2019-06-23 VITALS — BP 138/89 | HR 86 | Temp 96.2°F | Resp 18 | Wt 187.9 lb

## 2019-06-23 DIAGNOSIS — C61 Malignant neoplasm of prostate: Secondary | ICD-10-CM | POA: Diagnosis not present

## 2019-06-23 DIAGNOSIS — Z923 Personal history of irradiation: Secondary | ICD-10-CM | POA: Insufficient documentation

## 2019-06-23 MED ORDER — RIFAXIMIN 550 MG PO TABS: 550.0000 mg | ORAL_TABLET | Freq: Three times a day (TID) | ORAL | 0 refills | Status: DC

## 2019-06-23 NOTE — Telephone Encounter (Signed)
Rx sent to pharmacy   

## 2019-06-23 NOTE — Progress Notes (Signed)
Radiation Oncology Follow up Note  Name: GLORIA DELAMATER   Date:   06/23/2019 MRN:  [id number] DOB: Jan 10, 1946    This 74 y.o. male presents to the clinic today for 6-year follow-up status post I-125 interstitial implant for Gleason 6 adenocarcinoma presenting with a PSA of 8.7.  REFERRING PROVIDER: [name], MD  HPI: Patient is a 74 year old male now out 6 years having completed I-125 interstitial implant for Gleason 6 adenocarcinoma the prostate presenting with a PSA of 8.7.  Seen today in routine follow-up he is doing well from a symptomatic standpoint.  He specifically denies any increased lower urinary tract symptoms or diarrhea.  His PSA is gradually increasing most recent was 5.28 which is up from 3 a year ago and was also 3 back in 2019.  He is having no bone pain or discomfort..  COMPLICATIONS OF TREATMENT: none  FOLLOW UP COMPLIANCE: keeps appointments   PHYSICAL EXAM:  BP 138/89 (BP Location: Left Arm, Patient Position: Sitting)   Pulse 86   Temp (!) 96.2 F (35.7 C) (Tympanic)   Resp 18   Wt 187 lb 14.4 oz (85.2 kg)   BMI 30.33 kg/m  Well-developed well-nourished patient in NAD. HEENT reveals PERLA, EOMI, discs not visualized.  Oral cavity is clear. No oral mucosal lesions are identified. Neck is clear without evidence of cervical or supraclavicular adenopathy. Lungs are clear to A&P. Cardiac examination is essentially unremarkable with regular rate and rhythm without murmur rub or thrill. Abdomen is benign with no organomegaly or masses noted. Motor sensory and DTR levels are equal and symmetric in the upper and lower extremities. Cranial nerves II through XII are grossly intact. Proprioception is intact. No peripheral adenopathy or edema is identified. No motor or sensory levels are noted. Crude visual fields are within normal range.  RADIOLOGY RESULTS: No current films for review  PLAN: At this time based on the slowly increasing PSA I have ordered a 12-month  Eligard androgen deprivation therapy.  We will reevaluate him in 6 months with a repeat PSA at that time.  Depending on those results may refer to medical oncology or continue to pulse with ADT therapy.  Patient comprehends my rationale and treatment plan well.  I would like to take this opportunity to thank you for allowing me to participate in the care of your patient.Noreene Filbert, MD

## 2019-06-23 NOTE — Telephone Encounter (Signed)
Spoke with Patients wife and informed her that we do have samples of Xifaxan and he can come by the office and pick them up.

## 2019-06-23 NOTE — Telephone Encounter (Signed)
Patient is calling states he requested [facility] for his insurance would not pay for any other location.

## 2019-06-23 NOTE — Telephone Encounter (Signed)
Pt states that Xifaxan is over $800 through [facility]. He wants to know if there is an alternative med that is more affordable.

## 2019-06-28 ENCOUNTER — Telehealth: Payer: Self-pay | Admitting: Family Medicine

## 2019-06-28 ENCOUNTER — Telehealth: Payer: Self-pay | Admitting: Gastroenterology

## 2019-06-28 NOTE — Telephone Encounter (Signed)
Pt called office and states that Dr.Beavers nurse called him and informed him to discontinue med and Instructed to continue Colestipol 1 gram. Pt has been taking Colestipol BID. Pt is wanting to know if Dr.Scott thinks he should start taking Lomotil again. Please advise. Thank you

## 2019-06-28 NOTE — Telephone Encounter (Signed)
Pt contacted and states that he began taking the Rifaximin Friday for diarrhea and stomach pain. Pt states he took 3 tablets on Friday and 3 tablets on Saturday. Saturday evening around 7 or 8 pm, pt began to have stomach pain and swelling, nausea, and fatigue. Pt states he took 4-5 antacid tablets but they did not help. Finally had relief about 4:30-5:00 Sunday morning. Pt states he slept all day yesterday. Has not taken any more of the Rifaximin. Pt states he is not having any abdominal pain or swelling today, just feeling tired. Pt has call Dr.Beavers office to let them know what had went on, pt is waiting for a call back from [facility] office. Pt is wanting to know what Dr.Scott thinks he should do. Please advise. Thank you

## 2019-06-28 NOTE — Telephone Encounter (Signed)
It would be best for the patient to touch base with Dr. Tarri Glenn office regarding this My inclination would be to say yes it would be okay to use Lomotil but I would also like for him to get Dr. Tarri Glenn approval as well thank you

## 2019-06-28 NOTE — Telephone Encounter (Signed)
Pt wishes to discontinue Xifaxan.  He reported that the drug caused him to have abd swelling and severe abd pain that he could not stand up nor lie down.

## 2019-06-28 NOTE — Telephone Encounter (Signed)
I am thankful that he is feeling better after stopping the medications. Please offer a follow-up visit to discuss strategies given the intolerance to Xifaxan. Thank you.

## 2019-06-28 NOTE — Telephone Encounter (Signed)
Patient notified of the response and recommendations He will keep the follow up on 4/6

## 2019-06-28 NOTE — Telephone Encounter (Signed)
The pt has stopped the xifaxan due to extreme reaction.  He says after the 2nd day he had severe abd swelling, nausea, dizziness, and fatigue. He would like to know what Dr Tarri Glenn recommends at this time.  All symptoms resolved after 1 day of stopping the medication. Please advise

## 2019-06-28 NOTE — Telephone Encounter (Signed)
Patient wants Dr. Bary Leriche opinion on rifaximin (XIFAXAN) 550 MG TABS tablet . He said Dr. Tarri Glenn prescribed it to him last week for his stomach and when he took it he started feeling really nauseated and sick and after his 3rd dose he said that he felt really sick and felt like his stomach was swelling.  I told patient he also needs to contact Dr. Tarri Glenn so that they will know his symptoms.

## 2019-06-28 NOTE — Telephone Encounter (Signed)
Pt contacted and verbalized understanding. Pt will call Dr.Beavers office and get her approval also

## 2019-06-29 NOTE — Telephone Encounter (Signed)
The pt states that he has been taking colestipol for diarrhea and he is not sure if he is suppose to take lomotil as well.  He was advised that the colestipol is controlling his diarrhea and has been having somewhat normal stools.  He was advised that he does not need to take the lomotil as long as his stools are normal.  He will keep follow up appt on 4/6 as scheduled.  FYI Dr Tarri Glenn

## 2019-06-29 NOTE — Telephone Encounter (Signed)
Thank you. He should continue on colestipol and use Lomotil PRN when the colestipol is not working. Thank you.

## 2019-06-29 NOTE — Telephone Encounter (Signed)
Pt called back and requested to speak to RN regarding medication management.

## 2019-07-06 ENCOUNTER — Other Ambulatory Visit: Payer: Self-pay | Admitting: *Deleted

## 2019-07-06 DIAGNOSIS — C61 Malignant neoplasm of prostate: Secondary | ICD-10-CM

## 2019-07-07 ENCOUNTER — Inpatient Hospital Stay: Payer: Medicare HMO | Attending: Radiation Oncology

## 2019-07-07 ENCOUNTER — Other Ambulatory Visit: Payer: Self-pay

## 2019-07-07 DIAGNOSIS — Z79818 Long term (current) use of other agents affecting estrogen receptors and estrogen levels: Secondary | ICD-10-CM | POA: Diagnosis not present

## 2019-07-07 DIAGNOSIS — C61 Malignant neoplasm of prostate: Secondary | ICD-10-CM | POA: Insufficient documentation

## 2019-07-07 MED ORDER — LEUPROLIDE ACETATE (6 MONTH) 45 MG ~~LOC~~ KIT
45.0000 mg | PACK | Freq: Once | SUBCUTANEOUS | Status: AC
  Administered 2019-07-07: 45 mg via SUBCUTANEOUS
  Filled 2019-07-07: qty 45

## 2019-07-14 ENCOUNTER — Telehealth: Payer: Self-pay | Admitting: Gastroenterology

## 2019-07-14 MED ORDER — COLESTIPOL HCL 1 G PO TABS: 2.0000 g | ORAL_TABLET | Freq: Two times a day (BID) | ORAL | 3 refills | Status: DC

## 2019-07-14 NOTE — Telephone Encounter (Signed)
Pt needs r[facility] [place].

## 2019-07-14 NOTE — Telephone Encounter (Signed)
Rx sent to pharmacy   

## 2019-07-27 ENCOUNTER — Ambulatory Visit: Payer: Medicare HMO | Admitting: Gastroenterology

## 2019-08-12 DIAGNOSIS — E039 Hypothyroidism, unspecified: Secondary | ICD-10-CM | POA: Diagnosis not present

## 2019-08-13 LAB — TSH: TSH: 3.95 u[IU]/mL (ref 0.450–4.500)

## 2019-08-13 LAB — T4, FREE: Free T4: 1.08 ng/dL (ref 0.82–1.77)

## 2019-08-14 ENCOUNTER — Encounter: Payer: Self-pay | Admitting: Family Medicine

## 2019-09-01 ENCOUNTER — Ambulatory Visit: Payer: Medicare HMO | Admitting: Gastroenterology

## 2019-09-24 ENCOUNTER — Encounter: Payer: Self-pay | Admitting: Gastroenterology

## 2019-09-24 ENCOUNTER — Ambulatory Visit (INDEPENDENT_AMBULATORY_CARE_PROVIDER_SITE_OTHER): Payer: Medicare HMO | Admitting: Gastroenterology

## 2019-09-24 ENCOUNTER — Other Ambulatory Visit: Payer: Medicare HMO

## 2019-09-24 VITALS — BP 118/74 | HR 106 | Ht 66.0 in | Wt 186.1 lb

## 2019-09-24 DIAGNOSIS — R197 Diarrhea, unspecified: Secondary | ICD-10-CM

## 2019-09-24 MED ORDER — SULFAMETHOXAZOLE-TRIMETHOPRIM 800-160 MG PO TABS: 1.0000 | ORAL_TABLET | Freq: Two times a day (BID) | ORAL | 0 refills | Status: AC

## 2019-09-24 NOTE — Progress Notes (Addendum)
Referring Provider: [name], MD Primary Care Physician:  Thomas Drown, MD  Chief complaint: Diarrhea  IMPRESSION:  Chronic, unexplained diarrhea with recent exacerbation    - C diff, stool culture, and O&P negative 08/19/18    - Fecal calprotectin less than 16 09/25/2018    - ESR and sedimentation rate normal    - Right and left sided colon biopsies negative for microscopic colitis 09/29/2018    - Duodenal biopsies negative for celiac disease 09/29/2018    - TSH 7.360 03/15/2019 History of colon polyp     - "polyp" that was "polypoid colorectal mucosa" on colonoscopy in 2009    - no polyps on colonoscopy in 2015    - Rectal tubular adenoma on colonoscopy 09/29/2018    - Consider surveillance colonoscopy in 7 years if clinically appropriate at that time Hypothyroidism, recent TSH normal Iron deficiency anemia 2015 attributed to reflux and stricture Thomas [name])    - No iron deficiency or anemia present on 02/2019 labs Benign, distal esophageal stricture associated with reflux    - biopsies showed reflux 2015    - dilated with 34m Savory dilatory 2015 (Thomas [name]) Small internal hemorrhoids on colonoscopy 2015 3cm hiatal hernia  No known family history of colon cancer or polyps   Persistent chronic diarrhea despite Metamucil, Lomotil, and colestipol. Did not tolerate Xifaxan.   He has a normal appetite and has recently gained weight. Now with more bloating and less diarrhea. Recent psychosocial stressors as his wife of 22 years left him unexpectedly last week. No noted change in symptoms since she left.  Fecal calprotectin, ESR, and CRP were normal. Giardia, C. difficile, and stool culture testing was negative. EGD with duodenal biopsies showed no celiac. Colonoscopy with random biopsies was normal.  His TSH is now normal, although I've previously thought this could have been contributing.  No obvious medications that would cause the diarrhea.    PLAN: - Continue daily psyllium  for stool bulking, Lomotil as previously prescribed  - Discontinue colestipol 2 g BID - Trial of Septra DS BID x 14 days for possible bacterial overgrowth - Fecal elastase now - MRI/MRCP if symptoms are not improving after the Xifaxan - Consider surveillance colonoscopy in 7 years if clinically appropriate at that time  I spent 30 minutes, including in depth chart review, face-to-face time with the patient, coordinating care, and ordering studies and medications as appropriate, and documentation.  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 He was last seen 06/21/2009.  The interval history is obtained to the patient and review of his electronic health record. He has a history of prostate cancer treated with 1-125 interstitial implant, asthma, and dyslipidemia.  He completed his Covid vaccines and is back to working one day a week.   I originally met him in June 2020.  At that time he was reporting intermittent diarrhea for years that he has been able to control with Peptobismal PRN. He previously attributed it to stress. However, worse over the last couple of months and he is not responding to Peptobismal any more. At its worst he has had between 7 and 8 loose, explosive, watery bowel movements daily.  Associated urgency.  No blood or mucous. No longer responding to Peptobismal. Not getting better with increased fiber.  There is no associated abdominal pain or cramping. No blood or mucous in the stool. No systemic complaints. Weight stable. Appetite good. No change with Lactaid  and avoiding dairy.   No identified food triggers including sugar, dairy, gluten, spicy foods, or caffeine.  Testing has included: Stool studies 08/19/2018 were negative for C. difficile, ova and parasites, Salmonella, Shigella, Campylobacter, E. Coli.  09/25/18: Fecal calprotectin <16, CRP less than 1, ESR 11 Labs 03/15/19: Iron 91, ferritin 52, iron saturation 27, hemoglobin 15.3, MCV 94,  normal liver enzymes, TSH 7.360 Labs 08/12/19: TSH 3.95, T4 free 1.08 There is been no recent abdominal imaging.  Endoscopic evaluation 09/29/18: Colonoscopy: internal and external hemorrhoids, left-sided diverticulosis, small rectal tubular aenoma, normal random colon biopsies. EGD: large hiatal hernia, duodenal lipoma, peptic duodenitis, normal gastric biopsies  He returned in February 2021 having 4-6 watery, explosive BM daily with associated urgency despite Metamucil BID to TID. Nocturnal symptoms were decreasing.  A trial of Lomotil has provided minimal relief.  He had a good appetite and had gained 9 pounds during Covid.   In March 2021 he reported ongoing diarrhea despite a trial of colestipol but denied gas, bloating, eructation, or abdominal pain.Having 3-4 explosive BM daily with some nocturnal symptoms despite Metamucil.  He had gained more weight.  Did not tolerate colestipol due to nausea, dyspepsia, and muscle pain in his left arm. Did not tolerate a trial of Xifaxan in March 2021 because he developed abdominal swelling, nausea, dizziness, and fatigue after one dose.   Today he is concerned that he has bloating and not diarrhea. Significant flatus. Occurs primarily after eating. Laxative provided some relief. One to two partially formed stools while on Metamucil. Denies any true diarrhea. No blood or mucous. Continues to gain weight.   His wife of 22 years left him last week. He is confused and upset.  His daughter from [place] came and spent 4 days with him. Not sleeping. He is trying to eat.   He did not submit the stool sample for fecal elastase recommended at this last visit.   Past Medical History:  Diagnosis Date  . Allergy    trees and plants  . Anemia    IDA  . Anxiety   . Asthma 1998  . Diverticulitis   . Elevated PSA   . GERD (gastroesophageal reflux disease)   . Hyperlipidemia   . Hypothyroidism 03/16/2019  . Prostate cancer [facility])     Past Surgical  History:  Procedure Laterality Date  . HERNIA REPAIR  1999  . prostate cancer    . TRANSURETHRAL RESECTION OF PROSTATE    . VASECTOMY      Current Outpatient Medications  Medication Sig Dispense Refill  . albuterol (VENTOLIN HFA) 108 (90 Base) MCG/ACT inhaler TAKE 2 PUFFS BY MOUTH EVERY 6 HOURS AS NEEDED FOR WHEEZE 18 g 3  . ALPRAZolam (XANAX) 1 MG tablet 1 qhs prn 30 tablet 5  . Ascorbic Acid (VITAMIN C) 100 MG tablet Take 100 mg by mouth daily.    . cetirizine (ZYRTEC) 10 MG tablet Take 10 mg by mouth daily.    Marland Kitchen co-enzyme Q-10 50 MG capsule Take 50 mg by mouth daily.    . diphenhydrAMINE (BENADRYL) 25 MG tablet Take 25 mg by mouth every 6 (six) hours as needed.    . ferrous sulfate 325 (65 FE) MG tablet Take 325 mg by mouth daily with breakfast.    . levothyroxine (SYNTHROID) 50 MCG tablet Take 1 tablet (50 mcg total) by mouth daily. 30 tablet 4  . Melatonin 1 MG TABS Take by mouth.    . Multiple Vitamins-Minerals (MULTIVITAMIN WITH MINERALS)  tablet Take 1 tablet by mouth daily.    . niacin 100 MG tablet Take 100 mg by mouth at bedtime.    . Omega-3 Fatty Acids (FISH OIL) 1000 MG CAPS Take by mouth.    . Red Yeast Rice Extract (RED YEAST RICE PO) Take by mouth.    . silodosin (RAPAFLO) 4 MG CAPS capsule Take 4 mg by mouth daily with breakfast.    . colestipol (COLESTID) 1 g tablet Take 2 tablets (2 g total) by mouth 2 (two) times daily. 120 tablet 3   No current facility-administered medications for this visit.    Allergies as of 09/24/2019 - Review Complete 09/24/2019  Allergen Reaction Noted  . Augmentin [amoxicillin-pot clavulanate]  04/28/2015  . Ciprofloxacin  06/30/2013  . Nsaids Other (See Comments) 05/05/2014    Family History  Problem Relation Age of Onset  . Heart disease Father   . Hypertension Brother   . Diabetes Brother     Social History   Socioeconomic History  . Marital status: Married    Spouse name: Not on file  . Number of children: Not on file    . Years of education: Not on file  . Highest education level: Not on file  Occupational History  . Not on file  Tobacco Use  . Smoking status: Former Smoker    Types: Cigarettes    Quit date: 04/22/1982    Years since quitting: 37.4  . Smokeless tobacco: Never Used  Substance and Sexual Activity  . Alcohol use: Not Currently  . Drug use: Not Currently  . Sexual activity: Not on file  Other Topics Concern  . Not on file  Social History Narrative  . Not on file   Social Determinants of Health   Financial Resource Strain:   . Difficulty of Paying Living Expenses:   Food Insecurity:   . Worried About Charity fundraiser in the Last Year:   . Arboriculturist in the Last Year:   Transportation Needs:   . Film/video editor (Medical):   Marland Kitchen Lack of Transportation (Non-Medical):   Physical Activity:   . Days of Exercise per Week:   . Minutes of Exercise per Session:   Stress:   . Feeling of Stress :   Social Connections:   . Frequency of Communication with Friends and Family:   . Frequency of Social Gatherings with Friends and Family:   . Attends Religious Services:   . Active Member of Clubs or Organizations:   . Attends Archivist Meetings:   Marland Kitchen Marital Status:   Intimate Partner Violence:   . Fear of Current or Ex-Partner:   . Emotionally Abused:   Marland Kitchen Physically Abused:   . Sexually Abused:     Physical Exam: General: in no acute distress Neuro: Alert and appropriate Psych: Normal affect and normal insight Abd: soft, central obesity, nontender, no rebound, no guarding, no typany   Thomas [name] L. Tarri Glenn, MD, MPH [facility] 09/24/2019, 9:49 AM

## 2019-09-24 NOTE — Patient Instructions (Addendum)
Your provider has requested that you go to the basement level for an additional stool test before leaving today. Press "B" on the elevator. The lab is located at the first door on the left as you exit the elevator.   Continue daily psyllium for stool bulking and Lomotil as previously prescribed.   Discontinue colestipol.   We have sent the following medications to your pharmacy for you to pick up at your convenience: Septra DS one tablet by mouth twice daily x 14 days.   You will follow up with Dr. Tarri Glenn in the office on 11/10/19 at 9:50. Additional testing may be ordered at that time.

## 2019-09-27 ENCOUNTER — Telehealth: Payer: Self-pay | Admitting: Family Medicine

## 2019-09-27 NOTE — Telephone Encounter (Signed)
Nurses please reach out to the patient  (Patient was seen by gastroenterology and they stated that the patient is depressed)  Please let the patient know that his GI doctor called [place] and recommended a follow-up this month with [place].  Please set him up for a follow-up office visit within the next  weeks. (Sooner if the patient feels he needs to be seen next week) thanks

## 2019-09-27 NOTE — Telephone Encounter (Signed)
Left message to return call 

## 2019-09-28 NOTE — Telephone Encounter (Signed)
Left message to return call 

## 2019-09-30 NOTE — Telephone Encounter (Signed)
Called and discussed with pt. Pt states he is feeling better now than when he saw GI and he just had a lot going on then and he does not want an appointment at this time. Says he is not feeling depressed now. Will call if anything changes.

## 2019-10-01 LAB — PANCREATIC ELASTASE, FECAL: Pancreatic Elastase-1, Stool: >500 mcg/g

## 2019-10-05 ENCOUNTER — Telehealth: Payer: Self-pay | Admitting: Gastroenterology

## 2019-10-05 NOTE — Telephone Encounter (Signed)
Patient returned your call about results, please call patient one more time.   

## 2019-10-05 NOTE — Telephone Encounter (Signed)
Spoke with patient regarding lab results. Pt states that Bactrim is helping and that his stools look a lot better. Pt has a follow up appt on 11/10/19 at 9:50 am with Dr. Tarri Glenn.

## 2019-10-12 DIAGNOSIS — H524 Presbyopia: Secondary | ICD-10-CM | POA: Diagnosis not present

## 2019-10-12 DIAGNOSIS — H52223 Regular astigmatism, bilateral: Secondary | ICD-10-CM | POA: Diagnosis not present

## 2019-10-30 ENCOUNTER — Other Ambulatory Visit: Payer: Self-pay | Admitting: Family Medicine

## 2019-11-05 ENCOUNTER — Other Ambulatory Visit: Payer: Self-pay | Admitting: *Deleted

## 2019-11-05 ENCOUNTER — Telehealth: Payer: Self-pay | Admitting: Family Medicine

## 2019-11-05 MED ORDER — LEVOTHYROXINE SODIUM 50 MCG PO TABS: 50.0000 ug | ORAL_TABLET | Freq: Every day | ORAL | 0 refills | Status: DC

## 2019-11-05 NOTE — Telephone Encounter (Signed)
Pharmacy is telling pt they do not have refill on his levothyroxine (SYNTHROID) 50 MCG tablet  Pt is out of the medication.

## 2019-11-05 NOTE — Telephone Encounter (Signed)
Med resent to pharm and pt notified on voicemail.

## 2019-11-10 ENCOUNTER — Encounter: Payer: Self-pay | Admitting: Gastroenterology

## 2019-11-10 ENCOUNTER — Telehealth: Payer: Self-pay | Admitting: Gastroenterology

## 2019-11-10 ENCOUNTER — Ambulatory Visit (INDEPENDENT_AMBULATORY_CARE_PROVIDER_SITE_OTHER): Payer: Medicare HMO | Admitting: Gastroenterology

## 2019-11-10 VITALS — BP 130/70 | HR 99 | Ht 66.5 in | Wt 188.0 lb

## 2019-11-10 DIAGNOSIS — R140 Abdominal distension (gaseous): Secondary | ICD-10-CM | POA: Diagnosis not present

## 2019-11-10 DIAGNOSIS — K529 Noninfective gastroenteritis and colitis, unspecified: Secondary | ICD-10-CM

## 2019-11-10 DIAGNOSIS — R197 Diarrhea, unspecified: Secondary | ICD-10-CM | POA: Diagnosis not present

## 2019-11-10 MED ORDER — DIPHENOXYLATE-ATROPINE 2.5-0.025 MG PO TABS: 1.0000 | ORAL_TABLET | Freq: Four times a day (QID) | ORAL | 1 refills | Status: DC

## 2019-11-10 NOTE — Telephone Encounter (Signed)
Pt called thinking he had already had a CT scan done. Discussed with him he had a colonoscopy not a CT scan. Pt is aware and will keep the CT appt.

## 2019-11-10 NOTE — Progress Notes (Signed)
Referring Provider: [name], MD Primary Care Physician:  [name], MD  Chief complaint: Diarrhea  IMPRESSION:  Chronic, unexplained diarrhea with recent exacerbation    - C diff, stool culture, and O&P negative 08/19/18    - Fecal calprotectin less than 16 09/25/2018    - ESR and sedimentation rate normal    - Right and left sided colon biopsies negative for microscopic colitis 09/29/2018    - Duodenal biopsies negative for celiac disease 09/29/2018    - TSH 7.360 03/15/2019    - intolerant to Xifaxan, no response to Septra DS BID x 14 days History of colon polyp     - "polyp" that was "polypoid colorectal mucosa" on colonoscopy in 2009    - no polyps on colonoscopy in 2015    - Rectal tubular adenoma on colonoscopy 09/29/2018    - Consider surveillance colonoscopy in 7 years if clinically appropriate at that time Hypothyroidism, recent TSH normal Iron deficiency anemia 2015 attributed to reflux and stricture [name])    - No iron deficiency or anemia present on 02/2019 labs Benign, distal esophageal stricture associated with reflux    - biopsies showed reflux 2015    - dilated with 56m Savory dilatory 2015 ([name]) Small internal hemorrhoids on colonoscopy 2015 3cm hiatal hernia  No known family history of colon cancer or polyps   Persistent chronic diarrhea despite Metamucil, Lomotil, colestipol, and empiric Septra for SIBO.  Fecal calprotectin, ESR, and CRP were normal. Giardia, C. difficile, fecal elastase, and stool culture testing was negative. EGD with duodenal biopsies showed no celiac. Colonoscopy with random biopsies was normal.  His TSH is now normal, although I've previously thought this could have been contributing.  No obvious medications that would cause the diarrhea. No alarm features, in fact he is gaining weight. He feels that further evaluation is warranted at this time. Discussed cross-sectional imaging verus additional stool studies. Add Lomotil for  symptom management in the meantime.    PLAN: - Continue daily psyllium for stool bulking - Add Lomotil 5 mg 4 times daily until control achieved (maximum: diphenoxylate 20 mg/day). Once control is achieved, reduce dose as needed; maintenance dose should not exceed 5 mg daily - CT abd/pelvis with contrast to evaluate for ongoing diarrhea - Fasting serum gastrin, calcitonin, somatostatin, VIP, and 24 hours urine 5-HIAA - Follow-up in 3-4 months, earlier if needed - Consider surveillance colonoscopy in 7 years if clinically appropriate at that time - Review treatment of "nerves" with Dr. LWolfgang Phoenixas this may be contributing  I spent 40 minutes, including in depth chart review, face-to-face time with the patient, reviewing recent results, coordinating care, and ordering studies and medications as appropriate, and documentation.  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 We initially met 09/23/18. He was last seen 09/23/2009.  The interval history is obtained to the patient and review of his electronic health record. He has a history of prostate cancer, asthma, and dyslipidemia.  He completed his Covid vaccines. He is now working part time.   I originally met him in June 2020.  At that time he was reporting intermittent diarrhea for years that he has been able to control with Peptobismal PRN. He previously attributed it to stress. However, worse over the last couple of months and he is not responding to Peptobismal any more. At its worst he has had between 7 and 8 loose, explosive, watery bowel movements daily.  Associated urgency.  No blood or mucous. No longer responding to Peptobismal. Not getting better with increased fiber.  There is no associated abdominal pain or cramping. No blood or mucous in the stool. No systemic complaints. Weight stable. Appetite good. No change with Lactaid and avoiding dairy.   No identified food triggers including sugar, dairy, gluten, spicy  foods, or caffeine.  Testing has included: Stool studies 08/19/2018 were negative for C. difficile, ova and parasites, Salmonella, Shigella, Campylobacter, E. Coli.  09/25/18: Fecal calprotectin <16, CRP less than 1, ESR 11 Labs 03/15/19: Iron 91, ferritin 52, iron saturation 27, hemoglobin 15.3, MCV 94, normal liver enzymes, TSH 7.360 Labs 08/12/19: TSH 3.95, T4 free 1.08 Labs 09/24/19: pancreatic elastase normal  There is been no recent abdominal imaging.  Endoscopic evaluation 09/29/18: Colonoscopy: internal and external hemorrhoids, left-sided diverticulosis, small rectal tubular aenoma, normal random colon biopsies. EGD: large hiatal hernia, duodenal lipoma, peptic duodenitis, normal gastric biopsies  He returned in February 2021 having 4-6 watery, explosive BM daily with associated urgency despite Metamucil BID to TID. Nocturnal symptoms were decreasing.  A trial of Lomotil has provided minimal relief.  He had a good appetite and had gained 9 pounds during Covid.   In March 2021 he reported ongoing diarrhea despite a trial of colestipol but denied gas, bloating, eructation, or abdominal pain.Having 3-4 explosive BM daily with some nocturnal symptoms despite Metamucil.  He had gained more weight.  Did not tolerate colestipol due to nausea, dyspepsia, and muscle pain in his left arm. Did not tolerate a trial of Xifaxan in March 2021 because he developed abdominal swelling, nausea, dizziness, and fatigue after one dose.   In June 2021 he was concerned that he has bloating and not diarrhea. Significant flatus. Occured primarily after eating. Laxative provided some relief. One to two partially formed stools while on Metamucil. Denied any true diarrhea. Continued to gain weight that he attributed to Covid. At that visit, her reported that his wife of 22 years had left him.   He returns in scheduled follow-up today. An empiric trial of Septra DS BID for treatment of bacterial overgrowth provided no  relief. He has no ongoing abdominal pain. Has 4-5 poorly formed bowel movements daily. Continues to use the Metamucil 4-5 times daily.   No blood or mucous.  Concerned that his symptoms have not been present for almost 2 years. No alarm features.   Past Medical History:  Diagnosis Date  . Allergy    trees and plants  . Anemia    IDA  . Anxiety   . Asthma 1998  . Diverticulitis   . Elevated PSA   . GERD (gastroesophageal reflux disease)   . Hyperlipidemia   . Hypothyroidism 03/16/2019  . Prostate cancer [facility])     Past Surgical History:  Procedure Laterality Date  . HERNIA REPAIR  1999  . prostate cancer    . TRANSURETHRAL RESECTION OF PROSTATE    . VASECTOMY      Current Outpatient Medications  Medication Sig Dispense Refill  . albuterol (VENTOLIN HFA) 108 (90 Base) MCG/ACT inhaler TAKE 2 PUFFS BY MOUTH EVERY 6 HOURS AS NEEDED FOR WHEEZE 18 g 3  . ALPRAZolam (XANAX) 1 MG tablet 1 qhs prn 30 tablet 5  . Ascorbic Acid (VITAMIN C) 100 MG tablet Take 100 mg by mouth daily.    . cetirizine (ZYRTEC) 10 MG tablet Take 10 mg by mouth daily.    Marland Kitchen co-enzyme Q-10 50 MG capsule Take 50 mg  by mouth daily.    . diphenhydrAMINE (BENADRYL) 25 MG tablet Take 25 mg by mouth every 6 (six) hours as needed.    . ferrous sulfate 325 (65 FE) MG tablet Take 325 mg by mouth daily with breakfast.    . levothyroxine (SYNTHROID) 50 MCG tablet Take 1 tablet (50 mcg total) by mouth daily. 90 tablet 0  . Melatonin 1 MG TABS Take by mouth.    . Multiple Vitamins-Minerals (MULTIVITAMIN WITH MINERALS) tablet Take 1 tablet by mouth daily.    . niacin 100 MG tablet Take 100 mg by mouth at bedtime.    . Omega-3 Fatty Acids (FISH OIL) 1000 MG CAPS Take by mouth.    . Red Yeast Rice Extract (RED YEAST RICE PO) Take by mouth.     No current facility-administered medications for this visit.    Allergies as of 11/10/2019 - Review Complete 11/10/2019  Allergen Reaction Noted  . Augmentin [amoxicillin-pot  clavulanate]  04/28/2015  . Ciprofloxacin  06/30/2013  . Nsaids Other (See Comments) 05/05/2014    Family History  Problem Relation Age of Onset  . Heart disease Father   . Hypertension Brother   . Diabetes Brother     Social History   Socioeconomic History  . Marital status: Married    Spouse name: Not on file  . Number of children: Not on file  . Years of education: Not on file  . Highest education level: Not on file  Occupational History  . Not on file  Tobacco Use  . Smoking status: Former Smoker    Types: Cigarettes    Quit date: 04/22/1982    Years since quitting: 37.5  . Smokeless tobacco: Never Used  Vaping Use  . Vaping Use: Never used  Substance and Sexual Activity  . Alcohol use: Not Currently  . Drug use: Not Currently  . Sexual activity: Not on file  Other Topics Concern  . Not on file  Social History Narrative  . Not on file   Social Determinants of Health   Financial Resource Strain:   . Difficulty of Paying Living Expenses:   Food Insecurity:   . Worried About Charity fundraiser in the Last Year:   . Arboriculturist in the Last Year:   Transportation Needs:   . Film/video editor (Medical):   Marland Kitchen Lack of Transportation (Non-Medical):   Physical Activity:   . Days of Exercise per Week:   . Minutes of Exercise per Session:   Stress:   . Feeling of Stress :   Social Connections:   . Frequency of Communication with Friends and Family:   . Frequency of Social Gatherings with Friends and Family:   . Attends Religious Services:   . Active Member of Clubs or Organizations:   . Attends Archivist Meetings:   Marland Kitchen Marital Status:   Intimate Partner Violence:   . Fear of Current or Ex-Partner:   . Emotionally Abused:   Marland Kitchen Physically Abused:   . Sexually Abused:     Physical Exam: General: in no acute distress. Brighter affect today than at the time of his last visit.  Neuro: Alert and appropriate Abd: soft, central obesity, nontender,  no rebound, no guarding, no typany   Sharise Lippy L. Tarri Glenn, MD, MPH [facility] 11/10/2019, 10:04 AM

## 2019-11-10 NOTE — Telephone Encounter (Signed)
Pt is requesting a call back from a nurse regarding his CT scan

## 2019-11-10 NOTE — Patient Instructions (Signed)
You have been scheduled for a CT scan of the abdomen and pelvis at [facility], 1st floor Radiology. You are scheduled on 11/17/19  at 2:15pm. You should arrive 15 minutes prior to your appointment time for registration. Please follow the written instructions below on the day of your exam:    1) Do not eat anything after 10:15am (4 hours prior to your test)   The solution may taste better if refrigerated, but do NOT add ice or any other liquid to this solution. Shake well before drinking.   Drink 1 bottle of contrast @ 12:15pm (2 hours prior to your exam)  Drink 1 bottle of contrast @ 1:15pm(1 hour prior to your exam)   You may take any medications as prescribed with a small amount of water, if necessary. If you take any of the following medications: METFORMIN, GLUCOPHAGE, GLUCOVANCE, AVANDAMET, RIOMET, FORTAMET, [place] MET, JANUMET, GLUMETZA or METAGLIP, you MAY be asked to HOLD this medication 48 hours AFTER the exam.   The purpose of you drinking the oral contrast is to aid in the visualization of your intestinal tract. The contrast solution may cause some diarrhea. Depending on your individual set of symptoms, you may also receive an intravenous injection of x-ray contrast/dye. Plan on being at [facility] for 45 minutes or longer, depending on the type of exam you are having performed.   If you have any questions regarding your exam or if you need to reschedule, you may call [facility] at [phone] between the hours of 8:00 am and 5:00 pm, Monday-Friday.    Your provider has requested that you go to the basement level for lab work on tomorrow 11/11/19. Please make sure you are fasting. Press "B" on the elevator. The lab is located at the first door on the left as you exit the elevator.  Continue daily psyllium for stool bulking.   We have sent the following medications to your pharmacy for you to pick up at your convenience: Lomotil  -Add Lomotil - up to 4 times daily to  help with diarrhea.   Thank you for choosing me and [facility].  Dr. Thornton Park

## 2019-11-11 ENCOUNTER — Other Ambulatory Visit: Payer: Medicare HMO

## 2019-11-11 ENCOUNTER — Other Ambulatory Visit: Payer: Self-pay

## 2019-11-11 ENCOUNTER — Telehealth: Payer: Self-pay | Admitting: Gastroenterology

## 2019-11-11 DIAGNOSIS — K529 Noninfective gastroenteritis and colitis, unspecified: Secondary | ICD-10-CM | POA: Diagnosis not present

## 2019-11-11 DIAGNOSIS — R140 Abdominal distension (gaseous): Secondary | ICD-10-CM | POA: Diagnosis not present

## 2019-11-11 DIAGNOSIS — R197 Diarrhea, unspecified: Secondary | ICD-10-CM | POA: Diagnosis not present

## 2019-11-11 NOTE — Telephone Encounter (Signed)
I-stat creatinine ordered.

## 2019-11-11 NOTE — Telephone Encounter (Signed)
Vanda from [place] is requesting an stat creatinine order to be placed for this pt since he is having a CT scan done on 7/28

## 2019-11-15 ENCOUNTER — Telehealth: Payer: Self-pay | Admitting: Gastroenterology

## 2019-11-15 ENCOUNTER — Other Ambulatory Visit: Payer: Self-pay | Admitting: *Deleted

## 2019-11-15 ENCOUNTER — Other Ambulatory Visit (INDEPENDENT_AMBULATORY_CARE_PROVIDER_SITE_OTHER): Payer: Medicare HMO

## 2019-11-15 ENCOUNTER — Other Ambulatory Visit: Payer: Medicare HMO

## 2019-11-15 ENCOUNTER — Telehealth: Payer: Self-pay | Admitting: *Deleted

## 2019-11-15 DIAGNOSIS — K529 Noninfective gastroenteritis and colitis, unspecified: Secondary | ICD-10-CM

## 2019-11-15 DIAGNOSIS — R140 Abdominal distension (gaseous): Secondary | ICD-10-CM | POA: Diagnosis not present

## 2019-11-15 DIAGNOSIS — R197 Diarrhea, unspecified: Secondary | ICD-10-CM | POA: Diagnosis not present

## 2019-11-15 LAB — CBC WITH DIFFERENTIAL/PLATELET
Basophils Absolute: 0.1 10*3/uL (ref 0.0–0.1)
Basophils Relative: 1 % (ref 0.0–3.0)
Eosinophils Absolute: 0.5 10*3/uL (ref 0.0–0.7)
Eosinophils Relative: 7.4 % — ABNORMAL HIGH (ref 0.0–5.0)
HCT: 39.7 % (ref 39.0–52.0)
Hemoglobin: 13.7 g/dL (ref 13.0–17.0)
Lymphocytes Relative: 24.5 % (ref 12.0–46.0)
Lymphs Abs: 1.7 10*3/uL (ref 0.7–4.0)
MCHC: 34.6 g/dL (ref 30.0–36.0)
MCV: 93.2 fl (ref 78.0–100.0)
Monocytes Absolute: 0.6 10*3/uL (ref 0.1–1.0)
Monocytes Relative: 8.1 % (ref 3.0–12.0)
Neutro Abs: 4.1 10*3/uL (ref 1.4–7.7)
Neutrophils Relative %: 59 % (ref 43.0–77.0)
Platelets: 207 10*3/uL (ref 150.0–400.0)
RBC: 4.26 Mil/uL (ref 4.22–5.81)
RDW: 13.9 % (ref 11.5–15.5)
WBC: 6.9 10*3/uL (ref 4.0–10.5)

## 2019-11-15 NOTE — Telephone Encounter (Signed)
Spoke with patient and he wants to let dr. Tarri Glenn know she gave him a miracle drug. He is only having 2 bowel movements/day now!

## 2019-11-15 NOTE — Telephone Encounter (Signed)
Please ask him to submit the stool cards and CBC for follow-up. Thank you.

## 2019-11-15 NOTE — Telephone Encounter (Signed)
Patient notified of labs needed. He will come for CBC. Mailed the stool cards to him with instructions on how to collect.

## 2019-11-15 NOTE — Telephone Encounter (Signed)
Correction:  Patient would need a positive stool card and an abnormal CBC

## 2019-11-15 NOTE — Telephone Encounter (Signed)
Pt is scheduled for a CT abd/pelvis for 11/17/2019 due to the patient has not had a negative stool card or negative CBC. If Dr. Tarri Glenn would like to schedule a peer to peer it would need to be scheduled by calling [phone] option 4

## 2019-11-16 NOTE — Telephone Encounter (Signed)
This is great news. Thanks for the follow-up.

## 2019-11-17 ENCOUNTER — Ambulatory Visit (HOSPITAL_COMMUNITY): Payer: Medicare HMO

## 2019-11-18 ENCOUNTER — Telehealth: Payer: Self-pay | Admitting: Gastroenterology

## 2019-11-18 NOTE — Telephone Encounter (Signed)
Pt is wanting to know if he should bring stool samples that he received in the mail today.

## 2019-11-18 NOTE — Telephone Encounter (Signed)
Patient informed he can mailed the cards back to [place]. Instructed him to read the instructions for collection

## 2019-11-19 LAB — 5 HIAA, QUANTITATIVE, URINE, 24 HOUR
5 HIAA, 24 Hour Urine: 3.1 mg/24 h (ref <=6.0)
Total Volume: 2050 mL

## 2019-11-23 ENCOUNTER — Other Ambulatory Visit: Payer: Medicare HMO

## 2019-11-24 ENCOUNTER — Ambulatory Visit (HOSPITAL_COMMUNITY): Admission: RE | Admit: 2019-11-24 | Payer: Medicare HMO | Source: Ambulatory Visit

## 2019-11-24 DIAGNOSIS — N400 Enlarged prostate without lower urinary tract symptoms: Secondary | ICD-10-CM | POA: Diagnosis not present

## 2019-11-24 DIAGNOSIS — K219 Gastro-esophageal reflux disease without esophagitis: Secondary | ICD-10-CM | POA: Diagnosis not present

## 2019-11-24 DIAGNOSIS — C61 Malignant neoplasm of prostate: Secondary | ICD-10-CM | POA: Diagnosis not present

## 2019-11-24 DIAGNOSIS — R69 Illness, unspecified: Secondary | ICD-10-CM | POA: Diagnosis not present

## 2019-11-24 DIAGNOSIS — E039 Hypothyroidism, unspecified: Secondary | ICD-10-CM | POA: Diagnosis not present

## 2019-11-24 DIAGNOSIS — J449 Chronic obstructive pulmonary disease, unspecified: Secondary | ICD-10-CM | POA: Diagnosis not present

## 2019-11-24 DIAGNOSIS — R030 Elevated blood-pressure reading, without diagnosis of hypertension: Secondary | ICD-10-CM | POA: Diagnosis not present

## 2019-11-24 DIAGNOSIS — E669 Obesity, unspecified: Secondary | ICD-10-CM | POA: Diagnosis not present

## 2019-11-24 DIAGNOSIS — K08409 Partial loss of teeth, unspecified cause, unspecified class: Secondary | ICD-10-CM | POA: Diagnosis not present

## 2019-11-25 ENCOUNTER — Other Ambulatory Visit (INDEPENDENT_AMBULATORY_CARE_PROVIDER_SITE_OTHER): Payer: Medicare HMO

## 2019-11-25 ENCOUNTER — Telehealth: Payer: Self-pay | Admitting: Gastroenterology

## 2019-11-25 DIAGNOSIS — K529 Noninfective gastroenteritis and colitis, unspecified: Secondary | ICD-10-CM | POA: Diagnosis not present

## 2019-11-25 LAB — HEMOCCULT SLIDES (X 3 CARDS)
Fecal Occult Blood: NEGATIVE
OCCULT 1: NEGATIVE
OCCULT 2: NEGATIVE
OCCULT 3: NEGATIVE
OCCULT 4: NEGATIVE
OCCULT 5: NEGATIVE

## 2019-11-25 NOTE — Telephone Encounter (Signed)
Patient requesting refills on Lomotil

## 2019-11-26 NOTE — Telephone Encounter (Signed)
Janett Billow I sent a prescription on 11/10/19 with 1 refill. Pt needs to contact pharmacy to fill. I tried to call him, but he did not pick-up.

## 2019-11-27 LAB — GASTRIN: Gastrin: <15 pg/mL (ref <=100)

## 2019-11-27 LAB — SOMATOSTATIN: SOMATOSTATIN: 22 pg/mL

## 2019-11-27 LAB — CALCITONIN: Calcitonin: 5 pg/mL (ref <=10)

## 2019-11-27 LAB — VASOACTIVE INTESTINAL PEPTIDE (VIP): Vasoactive Intest Polypeptide: 56 pg/mL (ref <78)

## 2019-11-30 ENCOUNTER — Telehealth: Payer: Self-pay | Admitting: Family Medicine

## 2019-11-30 ENCOUNTER — Telehealth: Payer: Self-pay | Admitting: Gastroenterology

## 2019-11-30 NOTE — Telephone Encounter (Signed)
Left message for pt to call back  °

## 2019-11-30 NOTE — Telephone Encounter (Signed)
Pt is requesting a call back from a nurse to discuss his CT scan unable to be performed, pt would like to know what Dr Tarri Glenn would recommend as an alternative

## 2019-11-30 NOTE — Telephone Encounter (Signed)
Left message for pt to call back.  Pt calling for a refill on his lomotil, refill sent to pharmacy. His insurance would not approve the CT scan. He wants to know what he should do next since he could not have the scan. He knows Dr. Tarri Glenn is out of town and we will get back with him once she returns.

## 2019-11-30 NOTE — Telephone Encounter (Signed)
Patient requesting refill on albuterol inhaler to be sent to [facility]

## 2019-11-30 NOTE — Telephone Encounter (Signed)
Last seen 05/31/19

## 2019-11-30 NOTE — Telephone Encounter (Signed)
May have 6 refills recommend follow-up office visit by this fall

## 2019-12-01 ENCOUNTER — Other Ambulatory Visit: Payer: Self-pay | Admitting: *Deleted

## 2019-12-01 MED ORDER — ALBUTEROL SULFATE HFA 108 (90 BASE) MCG/ACT IN AERS: INHALATION_SPRAY | RESPIRATORY_TRACT | 5 refills | Status: DC

## 2019-12-01 NOTE — Telephone Encounter (Signed)
Please call to schedule ov for fall and send back to nurses for refill

## 2019-12-01 NOTE — Telephone Encounter (Signed)
Scheduled appointment for 10/12 for medication follow up

## 2019-12-02 ENCOUNTER — Ambulatory Visit (INDEPENDENT_AMBULATORY_CARE_PROVIDER_SITE_OTHER): Payer: Medicare HMO | Admitting: Family Medicine

## 2019-12-02 ENCOUNTER — Telehealth: Payer: Self-pay | Admitting: Family Medicine

## 2019-12-02 ENCOUNTER — Other Ambulatory Visit: Payer: Self-pay

## 2019-12-02 VITALS — BP 128/84 | Temp 98.2°F | Ht 66.0 in | Wt 188.8 lb

## 2019-12-02 DIAGNOSIS — E039 Hypothyroidism, unspecified: Secondary | ICD-10-CM

## 2019-12-02 DIAGNOSIS — R6889 Other general symptoms and signs: Secondary | ICD-10-CM

## 2019-12-02 DIAGNOSIS — R972 Elevated prostate specific antigen [PSA]: Secondary | ICD-10-CM | POA: Diagnosis not present

## 2019-12-02 DIAGNOSIS — F439 Reaction to severe stress, unspecified: Secondary | ICD-10-CM | POA: Diagnosis not present

## 2019-12-02 DIAGNOSIS — R69 Illness, unspecified: Secondary | ICD-10-CM | POA: Diagnosis not present

## 2019-12-02 MED ORDER — KETOCONAZOLE 2 % EX CREA: TOPICAL_CREAM | CUTANEOUS | 4 refills | Status: DC

## 2019-12-02 MED ORDER — FLUCONAZOLE 200 MG PO TABS: 200.0000 mg | ORAL_TABLET | Freq: Every day | ORAL | 0 refills | Status: DC

## 2019-12-02 NOTE — Telephone Encounter (Signed)
Thanks for the follow-up. The CT was recommended to further evaluate his diarrhea. However, his insurance company has declined coverage. If this has improved, it is no longer needed. Thank you.

## 2019-12-02 NOTE — Telephone Encounter (Signed)
4:20 pm for ov plz

## 2019-12-02 NOTE — Telephone Encounter (Signed)
Pt states he has an infection on his penis. Pt states he does a have a little swelling. Started yesterday. No other symptoms. Pt states that he had this issue several years ago and provider prescribed a cream. Informed pt that he would need to be see. Please advise on when or if we should let pt know that he should be see at urgent care. Thank you

## 2019-12-02 NOTE — Telephone Encounter (Signed)
Patient notified and scheduled 

## 2019-12-02 NOTE — Progress Notes (Signed)
   Subjective:    Patient ID: Thomas [name], male    DOB: 09-26-1945, 74 y.o.   MRN: [id number]  HPI  Patient arrives with red and swollen penis. Patient states this happened in the past and he got a cream that helped Patient also relates at times he is find himself having some forgetfulness. He is also struggling with some depression because his wife left him Patient denies any desire to hurt himself  Review of Systems  Constitutional: Negative for diaphoresis and fatigue.  HENT: Negative for congestion and rhinorrhea.   Respiratory: Negative for cough and shortness of breath.   Cardiovascular: Negative for chest pain and leg swelling.  Gastrointestinal: Negative for abdominal pain and diarrhea.  Skin: Negative for color change and rash.  Neurological: Negative for dizziness and headaches.  Psychiatric/Behavioral: Negative for behavioral problems and confusion.       Objective:   Physical Exam Vitals reviewed.  Constitutional:      General: He is not in acute distress. HENT:     Head: Normocephalic and atraumatic.  Eyes:     General:        Right eye: No discharge.        Left eye: No discharge.  Neck:     Trachea: No tracheal deviation.  Cardiovascular:     Rate and Rhythm: Normal rate and regular rhythm.     Heart sounds: Normal heart sounds. No murmur heard.   Pulmonary:     Effort: Pulmonary effort is normal. No respiratory distress.     Breath sounds: Normal breath sounds.  Lymphadenopathy:     Cervical: No cervical adenopathy.  Skin:    General: Skin is warm and dry.  Neurological:     Mental Status: He is alert.     Coordination: Coordination normal.  Psychiatric:        Behavior: Behavior normal.    GU-reveals a significant yeast rash of the penile region       Assessment & Plan:  Yeast rash recommend ketoconazole recommend Diflucan recommend follow-up if ongoing troubles or problems  Forgetfulness along with depression-I am concerned that  this patient may be developing some cognitive issues I recommend that he follow-up in the near future for further instruction evaluation and treatment the patient will do some lab work before his next visit

## 2019-12-02 NOTE — Telephone Encounter (Signed)
Spoke with pt and he is aware. 

## 2019-12-02 NOTE — Telephone Encounter (Signed)
Pt needs something call in he has infection in his private area. Cream he had this before. Pt has appt October the 12th for Med Follow Up.   Pt call back number [phone]

## 2019-12-03 DIAGNOSIS — R6889 Other general symptoms and signs: Secondary | ICD-10-CM | POA: Diagnosis not present

## 2019-12-03 DIAGNOSIS — R972 Elevated prostate specific antigen [PSA]: Secondary | ICD-10-CM | POA: Diagnosis not present

## 2019-12-03 DIAGNOSIS — E039 Hypothyroidism, unspecified: Secondary | ICD-10-CM | POA: Diagnosis not present

## 2019-12-03 DIAGNOSIS — R69 Illness, unspecified: Secondary | ICD-10-CM | POA: Diagnosis not present

## 2019-12-04 LAB — BASIC METABOLIC PANEL
BUN/Creatinine Ratio: 11 (ref 10–24)
BUN: 12 mg/dL (ref 8–27)
CO2: 25 mmol/L (ref 20–29)
Calcium: 9.5 mg/dL (ref 8.6–10.2)
Chloride: 103 mmol/L (ref 96–106)
Creatinine, Ser: 1.07 mg/dL (ref 0.76–1.27)
GFR calc Af Amer: 78 mL/min/{1.73_m2} (ref >59)
GFR calc non Af Amer: 68 mL/min/{1.73_m2} (ref >59)
Glucose: 99 mg/dL (ref 65–99)
Potassium: 4.9 mmol/L (ref 3.5–5.2)
Sodium: 141 mmol/L (ref 134–144)

## 2019-12-04 LAB — PSA: Prostate Specific Ag, Serum: 0.3 ng/mL (ref 0.0–4.0)

## 2019-12-04 LAB — VITAMIN B12: Vitamin B-12: 555 pg/mL (ref 232–1245)

## 2019-12-04 LAB — TSH: TSH: 4.78 u[IU]/mL — ABNORMAL HIGH (ref 0.450–4.500)

## 2019-12-06 ENCOUNTER — Other Ambulatory Visit: Payer: Self-pay | Admitting: *Deleted

## 2019-12-06 DIAGNOSIS — E039 Hypothyroidism, unspecified: Secondary | ICD-10-CM

## 2019-12-06 MED ORDER — LEVOTHYROXINE SODIUM 50 MCG PO TABS: ORAL_TABLET | ORAL | 0 refills | Status: DC

## 2019-12-16 ENCOUNTER — Inpatient Hospital Stay: Payer: Medicare HMO | Attending: Radiation Oncology

## 2019-12-16 ENCOUNTER — Other Ambulatory Visit: Payer: Self-pay

## 2019-12-16 DIAGNOSIS — C61 Malignant neoplasm of prostate: Secondary | ICD-10-CM | POA: Insufficient documentation

## 2019-12-16 LAB — PSA: Prostatic Specific Antigen: 0.27 ng/mL (ref 0.00–4.00)

## 2019-12-22 ENCOUNTER — Encounter: Payer: Self-pay | Admitting: Radiation Oncology

## 2019-12-22 ENCOUNTER — Other Ambulatory Visit: Payer: Self-pay

## 2019-12-23 ENCOUNTER — Ambulatory Visit
Admission: RE | Admit: 2019-12-23 | Discharge: 2019-12-23 | Disposition: A | Discharge status: Home / Self Care | Payer: Medicare HMO | Source: Ambulatory Visit | Attending: Radiation Oncology | Admitting: Radiation Oncology

## 2019-12-23 ENCOUNTER — Encounter: Payer: Self-pay | Admitting: Radiation Oncology

## 2019-12-23 VITALS — BP 135/84 | HR 85 | Temp 96.0°F | Wt 189.0 lb

## 2019-12-23 DIAGNOSIS — C61 Malignant neoplasm of prostate: Secondary | ICD-10-CM

## 2019-12-23 NOTE — Progress Notes (Signed)
Radiation Oncology Follow up Note  Name: Thomas [name]   Date:   12/23/2019 MRN:  [id number] DOB: 12-08-1945    This 74 y.o. male presents to the clinic today for 6-1/2-year follow-up status post I 125 interstitial implant for Gleason 6 adenocarcinoma presenting with a PSA of 8.7.  REFERRING PROVIDER: [name], MD  HPI: Patient is a 74 year old male now out over 6 and half years having completed I-125 interstitial implant for Gleason 6 adenocarcinoma. He PSA was gradually increasing up to 5.28. He was started on Eligard.Marland Kitchen He was on 77-month Eligard and his PSA has dropped 2.27 he continues to have problems with diarrhea which developed about 2 years ago he has seen GI for that. He is specifically denies bone pain.  COMPLICATIONS OF TREATMENT: none  FOLLOW UP COMPLIANCE: keeps appointments   PHYSICAL EXAM:  BP 135/84 (BP Location: Left Arm, Patient Position: Sitting, Cuff Size: Normal)   Pulse 85   Temp (!) 96 F (35.6 C) (Tympanic)   Wt 189 lb (85.7 kg)   BMI 30.51 kg/m  Well-developed well-nourished patient in NAD. HEENT reveals PERLA, EOMI, discs not visualized.  Oral cavity is clear. No oral mucosal lesions are identified. Neck is clear without evidence of cervical or supraclavicular adenopathy. Lungs are clear to A&P. Cardiac examination is essentially unremarkable with regular rate and rhythm without murmur rub or thrill. Abdomen is benign with no organomegaly or masses noted. Motor sensory and DTR levels are equal and symmetric in the upper and lower extremities. Cranial nerves II through XII are grossly intact. Proprioception is intact. No peripheral adenopathy or edema is identified. No motor or sensory levels are noted. Crude visual fields are within normal range.  RADIOLOGY RESULTS: No current films to review  PLAN: Present time patient is doing well under biochemical control of his prostate cancer. Him to wait another 6 months repeat his PSA and make further  recommendations about ADT therapy at that time. I have also suggested the patient try some activity a yogurt for some of his diarrhea. Patient knows to call with any concerns. 62-month follow-up was arranged.  I would like to take this opportunity to thank you for allowing me to participate in the care of your patient.Noreene Filbert, MD

## 2019-12-24 ENCOUNTER — Encounter: Payer: Self-pay | Admitting: Family Medicine

## 2019-12-24 ENCOUNTER — Ambulatory Visit (INDEPENDENT_AMBULATORY_CARE_PROVIDER_SITE_OTHER): Payer: Medicare HMO | Admitting: Family Medicine

## 2019-12-24 ENCOUNTER — Other Ambulatory Visit: Payer: Self-pay

## 2019-12-24 VITALS — BP 118/82 | HR 91 | Temp 98.1°F | Ht 66.0 in | Wt 190.8 lb

## 2019-12-24 DIAGNOSIS — B3749 Other urogenital candidiasis: Secondary | ICD-10-CM | POA: Diagnosis not present

## 2019-12-24 DIAGNOSIS — F439 Reaction to severe stress, unspecified: Secondary | ICD-10-CM

## 2019-12-24 DIAGNOSIS — R69 Illness, unspecified: Secondary | ICD-10-CM | POA: Diagnosis not present

## 2019-12-24 MED ORDER — LEVOTHYROXINE SODIUM 50 MCG PO TABS: ORAL_TABLET | ORAL | 1 refills | Status: DC

## 2019-12-24 NOTE — Progress Notes (Signed)
   Subjective:    Patient ID: Thomas [name], male    DOB: Mar 01, 1946, 74 y.o.   MRN: [id number]  HPI  Patient arrives for a follow up on rash in private area. Patient states he is doing much better. Patient's moods are doing better denies being suicidal.  He feels his energy is starting to do better.  Review of Systems  Constitutional: Negative for activity change.  HENT: Negative for congestion and rhinorrhea.   Respiratory: Negative for cough and shortness of breath.   Cardiovascular: Negative for chest pain.  Gastrointestinal: Negative for abdominal pain, diarrhea, nausea and vomiting.  Genitourinary: Negative for dysuria and hematuria.  Neurological: Negative for weakness and headaches.  Psychiatric/Behavioral: Negative for behavioral problems and confusion.       Objective:   Physical Exam Vitals reviewed.  Constitutional:      General: He is not in acute distress. HENT:     Head: Normocephalic and atraumatic.  Eyes:     General:        Right eye: No discharge.        Left eye: No discharge.  Neck:     Trachea: No tracheal deviation.  Cardiovascular:     Rate and Rhythm: Normal rate and regular rhythm.     Heart sounds: Normal heart sounds. No murmur heard.   Pulmonary:     Effort: Pulmonary effort is normal. No respiratory distress.     Breath sounds: Normal breath sounds.  Lymphadenopathy:     Cervical: No cervical adenopathy.  Skin:    General: Skin is warm and dry.  Neurological:     Mental Status: He is alert.     Coordination: Coordination normal.  Psychiatric:        Behavior: Behavior normal.           Assessment & Plan:  Depression is doing better patient at this point time does not want anything different done Rash is doing better. Follow-up in 3 to 4 months

## 2020-01-29 ENCOUNTER — Other Ambulatory Visit: Payer: Medicare HMO

## 2020-02-01 ENCOUNTER — Ambulatory Visit (INDEPENDENT_AMBULATORY_CARE_PROVIDER_SITE_OTHER): Payer: Medicare HMO | Admitting: Family Medicine

## 2020-02-01 ENCOUNTER — Encounter: Payer: Self-pay | Admitting: Family Medicine

## 2020-02-01 VITALS — BP 132/74 | HR 89 | Temp 96.6°F | Wt 193.2 lb

## 2020-02-01 DIAGNOSIS — Z23 Encounter for immunization: Secondary | ICD-10-CM | POA: Diagnosis not present

## 2020-02-01 DIAGNOSIS — E038 Other specified hypothyroidism: Secondary | ICD-10-CM

## 2020-02-01 DIAGNOSIS — R69 Illness, unspecified: Secondary | ICD-10-CM | POA: Diagnosis not present

## 2020-02-01 DIAGNOSIS — F439 Reaction to severe stress, unspecified: Secondary | ICD-10-CM

## 2020-02-01 NOTE — Progress Notes (Signed)
   Subjective:    Patient ID: Thomas [name], male    DOB: 10/20/1945, 74 y.o.   MRN: [id number]  HPI  Pt here for follow up. Pt states he is doing well. No issues or concerns.  Patient under some mild depression symptoms but not suicidal states he has church friends which help him find himself feeling stressed a fair amount. Pt states Albuterol has increased on price; having to use just about each day so he went ahead he picked it up.  Denies any other particular troubles states he uses albuterol on a as needed basis does not want pulmonary work-up does not want COPD medicine   Review of Systems  Constitutional: Negative for activity change.  HENT: Negative for congestion and rhinorrhea.   Respiratory: Negative for cough and shortness of breath.   Cardiovascular: Negative for chest pain.  Gastrointestinal: Negative for abdominal pain, diarrhea, nausea and vomiting.  Genitourinary: Negative for dysuria and hematuria.  Neurological: Negative for weakness and headaches.  Psychiatric/Behavioral: Negative for behavioral problems and confusion.       Objective:   Physical Exam Vitals reviewed.  Cardiovascular:     Rate and Rhythm: Normal rate and regular rhythm.     Heart sounds: Normal heart sounds. No murmur heard.   Pulmonary:     Effort: Pulmonary effort is normal.     Breath sounds: Normal breath sounds.  Lymphadenopathy:     Cervical: No cervical adenopathy.  Neurological:     Mental Status: He is alert.  Psychiatric:        Behavior: Behavior normal.           Assessment & Plan:  1. Need for vaccination Flu shot today - Flu Vaccine QUAD High Dose(Fluad)  2. Other specified hypothyroidism Continue thyroid medicine labs not indicated currently  3. Stress Under moderate stress has the help of his church friends denies being depressed but does not want to go on depression medicine recently went through separation follow-up here in 3 months warning signs were  discussed to the patient follow-up sooner problems

## 2020-02-03 ENCOUNTER — Other Ambulatory Visit: Payer: Medicare HMO

## 2020-02-18 ENCOUNTER — Telehealth: Payer: Self-pay | Admitting: Gastroenterology

## 2020-02-18 NOTE — Telephone Encounter (Signed)
Pt returning your call

## 2020-02-18 NOTE — Telephone Encounter (Signed)
Left message for pt to call back.  Pt states he was given xifaxan samples by Dr. Tarri Glenn earlier this year and he took 2 pills and stopped them because he thought they were making his stomach worse. Pt is calling to see if Dr. Tarri Glenn thinks it would be ok for him to try and take this again. Pt states he still has the samples as he only took 2 pills. Please advise.

## 2020-02-18 NOTE — Telephone Encounter (Signed)
Spoke with pt and he is going to try the xifaxan and see how he does. Pt states he has been taking metamucil 4-5X/day and it helps to make the stools soft. If he does not take the metamucil it is watery diarrhea. Reports he has 6-8 stools/day and sometimes he doesn't make it to he bathroom on time. Pt scheduled to see Tye Savoy NP 03/02/20@3pm . Pt aware of appt.

## 2020-02-18 NOTE — Telephone Encounter (Signed)
That would be fine. Please find out more about the diarrhea (consistency of stools, frequency, associated symptoms) and schedule office follow-up with me or an APP. Thanks.

## 2020-02-18 NOTE — Telephone Encounter (Signed)
Pt is requesting medication refill on his XIFLAXAN, pt states he is still experiencing diarrhea.

## 2020-03-02 ENCOUNTER — Encounter: Payer: Self-pay | Admitting: Nurse Practitioner

## 2020-03-02 ENCOUNTER — Ambulatory Visit (INDEPENDENT_AMBULATORY_CARE_PROVIDER_SITE_OTHER): Payer: Medicare HMO | Admitting: Nurse Practitioner

## 2020-03-02 VITALS — BP 124/70 | HR 88 | Ht 63.75 in | Wt 191.5 lb

## 2020-03-02 DIAGNOSIS — K529 Noninfective gastroenteritis and colitis, unspecified: Secondary | ICD-10-CM | POA: Diagnosis not present

## 2020-03-02 NOTE — Progress Notes (Signed)
Reviewed and agree with management plans. ? ?Virginio Isidore L. Yilin Weedon, MD, MPH  ?

## 2020-03-02 NOTE — Progress Notes (Signed)
ASSESSMENT AND PLAN    # 74 yo male with chronic, unexplained diarrhea. Extensive negative workup. Bowel movements significantly improved after a course of Xifaxan. Hopefully effect will be durable.  --Continue daily fiber --follow up as needed.  --He feels like lomotil didn't help but if has recurrent diarrhea would recommend he give Lomotil another try   HISTORY OF PRESENT ILLNESS     Primary Gastroenterologist : Thornton Park, MD  Chief Complaint : follow up on diarrhea  Thomas [name] is a 74 y.o. male with PMH / [place] significant for,  but not necessarily limited to: Chronic diarrhea, GERD, hyperlipidemia, hypothyroidism, asthma  Patient has chronic, unexplained diarrhea.  He has had an extensive work-up including but not necessarily limited to stool studies, systemic inflammatory markers, fecal calprotectin, pancreatic elastase, random colon biopsies, and duodenal biopsies for celiac.  He has taken Lomotil and colestipol without improvement.  He been treated empirically with Septra for SIBO.  At his last visit in late July for exacerbation of chronic diarrhea patient was continued on psyllium.  Lomotil was added.  He was scheduled for CT scan of the abdomen and pelvis but it appears that it was never done.  From the phone records I believe insurance denied it . Additional labs were obtained and normal including gastrin level, somatostatin and vasoactive intestinal peptide.  Patient's bowel movements significantly improved.  He called back the end of October with recurrent diarrhea.  He was started on Xifaxan and given an appointment for today.  Interval History:  Patient said the Lomotil did not work .  He is still taking daily Metamucil.  He completed Xifaxan yesterday and his bowel movements have significantly improved. Stools are nearly formed. He is having only ~ 3 BMs a day now which is down from nearly 12 x a day. He is very please. He was drinking a pot of coffee a day but  now only drinking two cups a day and thinks that also helped the diarrhea.     Past Medical History:  Diagnosis Date  . Allergy    trees and plants  . Anemia    IDA  . Anxiety   . Asthma 1998  . Diverticulitis   . Elevated PSA   . GERD (gastroesophageal reflux disease)   . Hyperlipidemia   . Hypothyroidism 03/16/2019  . Prostate cancer ([place])     Current Medications, Allergies, Past Surgical History, Family History and Social History were reviewed in [employer] record.   Current Outpatient Medications  Medication Sig Dispense Refill  . albuterol (VENTOLIN HFA) 108 (90 Base) MCG/ACT inhaler TAKE 2 PUFFS BY MOUTH EVERY 6 HOURS AS NEEDED FOR WHEEZE 18 g 5  . Ascorbic Acid (VITAMIN C) 100 MG tablet Take 100 mg by mouth daily.    . cetirizine (ZYRTEC) 10 MG tablet Take 10 mg by mouth daily.    Marland Kitchen co-enzyme Q-10 50 MG capsule Take 50 mg by mouth daily.    . diphenhydrAMINE (BENADRYL) 25 MG tablet Take 25 mg by mouth every 6 (six) hours as needed.    . ferrous sulfate 325 (65 FE) MG tablet Take 325 mg by mouth daily with breakfast.    . levothyroxine (SYNTHROID) 50 MCG tablet Take 1 1/2 tablets on Monday, Wednesday and Friday and take 1 tablet all other days. 108 tablet 1  . Melatonin 1 MG TABS Take by mouth.    . Multiple Vitamins-Minerals (MULTIVITAMIN WITH MINERALS) tablet Take 1 tablet  by mouth daily.    . niacin 100 MG tablet Take 100 mg by mouth at bedtime.    . Omega-3 Fatty Acids (FISH OIL) 1000 MG CAPS Take by mouth.    . Red Yeast Rice Extract (RED YEAST RICE PO) Take by mouth.     No current facility-administered medications for this visit.    Review of Systems: No chest pain. No shortness of breath. No urinary complaints.   PHYSICAL EXAM :    Wt Readings from Last 3 Encounters:  03/02/20 191 lb 8 oz (86.9 kg)  02/01/20 193 lb 3.2 oz (87.6 kg)  12/24/19 190 lb 12.8 oz (86.5 kg)    BP 124/70 (BP Location: Left Arm, Patient Position: Sitting,  Cuff Size: Normal)   Pulse 88   Ht 5' 3.75" (1.619 m) Comment: height measured without shoes  Wt 191 lb 8 oz (86.9 kg)   BMI 33.13 kg/m  Constitutional:  Pleasant male in no acute distress. Psychiatric: Normal mood and affect. Behavior is normal. EENT: Pupils normal.  Conjunctivae are normal. No scleral icterus. Neck supple.  Cardiovascular: Normal rate, regular rhythm. No edema Pulmonary/chest: Effort normal and breath sounds normal. No wheezing, rales or rhonchi. Abdominal: Soft, nondistended, nontender. Bowel sounds active throughout. There are no masses palpable. No hepatomegaly. Neurological: Alert and oriented to person place and time. Skin: Skin is warm and dry. No rashes noted.  Tye Savoy, NP  03/02/2020, 3:25 PM

## 2020-03-02 NOTE — Patient Instructions (Signed)
If you are age 76 or older, your body mass index should be between 23-30. Your Body mass index is 33.13 kg/m. If this is out of the aforementioned range listed, please consider follow up with your Primary Care Provider.  If you are age 35 or younger, your body mass index should be between 19-25. Your Body mass index is 33.13 kg/m. If this is out of the aformentioned range listed, please consider follow up with your Primary Care Provider.   Follow up as needed.

## 2020-04-04 ENCOUNTER — Telehealth: Payer: Self-pay

## 2020-04-04 NOTE — Telephone Encounter (Signed)
   MP    1        [name] Male, 74 y.o., 25-Jan-1946  MRN:  [id number] Phone:  [phone] Thomas [name])       PCP:  Thomas Drown, MD Primary [place] [employer] Medicare Hmo/Ppo  Next Appt With Family Medicine Sallee Lange, MD) 05/03/2020 at 10:00 AM             To Close This Visit  Required Items  No additional encounter notes found.      Advice Only (Patient states he has been reading up on Covid and notice he has been having some of the symptoms for 3 weeks now. Shortness of breathe,cant remember,asthma flare ups. Please advise were to put on schedule.)  You routed conversation to [facility] 38 minutes ago (2:34 PM)   [place], [place] [phone]  You 3 hours ago (11:57 AM)       Questionnaires  No completed forms available for this encounter.

## 2020-04-04 NOTE — Telephone Encounter (Signed)
Pt needs appt for testing. Please set pt up. Thank you

## 2020-04-05 NOTE — Telephone Encounter (Signed)
So the patient can make a choice Choice #1 he can set up an office visit either with myself or with Santiago Glad for further evaluation-and we can do a test if he so desires at that time Choice #2 he can come just for testing if he only wants that Choice #3 he can watch things and call us back if problems

## 2020-04-05 NOTE — Telephone Encounter (Signed)
So if he did have Covid in the past sometimes that can cause prolonged shortness of breath but if he does not feel acutely sick I think a follow-up in January is fine thank you

## 2020-04-05 NOTE — Telephone Encounter (Signed)
FYI-patient refused to come in he didn't want to be tested. He wanted to know if you would just talk or would he have to take a Covid test. I explained to him I dont know until he is seen. He stated no appointment.

## 2020-04-05 NOTE — Telephone Encounter (Signed)
Patient states he has a history of breathing issues and was told it would just get worse and he can tell it has gotten worse over time. Patient states he just wonders if he has covid in the past and if there was anything that he would do for breathing if he had Covid in the past with his history of chronic breathing issues. Patient has follow up appt scheduled in January and said he will call if having any problems Patient does not feel like he has Covid currently

## 2020-04-06 NOTE — Telephone Encounter (Signed)
Patient notified of recommendations and verbalized understanding.

## 2020-05-03 ENCOUNTER — Ambulatory Visit: Payer: Medicare HMO | Admitting: Family Medicine

## 2020-05-05 ENCOUNTER — Telehealth: Payer: Self-pay

## 2020-05-05 MED ORDER — ALBUTEROL SULFATE HFA 108 (90 BASE) MCG/ACT IN AERS: INHALATION_SPRAY | RESPIRATORY_TRACT | 1 refills | Status: DC

## 2020-05-05 MED ORDER — LEVOTHYROXINE SODIUM 50 MCG PO TABS: ORAL_TABLET | ORAL | 0 refills | Status: DC

## 2020-05-05 NOTE — Telephone Encounter (Signed)
Patient called and said he needs his 2 medications transferred to [facility] because it is a lot cheaper for him.  Patient had an appt this week but we changed it due to covid #'s, has office visit schd for about 6 weeks from now.

## 2020-05-05 NOTE — Telephone Encounter (Signed)
Sent to [place], patient notified.

## 2020-06-15 ENCOUNTER — Inpatient Hospital Stay: Payer: Medicare HMO | Attending: Radiation Oncology

## 2020-06-15 DIAGNOSIS — C61 Malignant neoplasm of prostate: Secondary | ICD-10-CM | POA: Diagnosis not present

## 2020-06-15 LAB — PSA: Prostatic Specific Antigen: 4.71 ng/mL — ABNORMAL HIGH (ref 0.00–4.00)

## 2020-06-21 ENCOUNTER — Encounter: Payer: Self-pay | Admitting: Radiation Oncology

## 2020-06-22 ENCOUNTER — Other Ambulatory Visit: Payer: Self-pay

## 2020-06-22 ENCOUNTER — Other Ambulatory Visit: Payer: Self-pay | Admitting: Licensed Clinical Social Worker

## 2020-06-22 ENCOUNTER — Encounter: Payer: Self-pay | Admitting: Radiation Oncology

## 2020-06-22 ENCOUNTER — Ambulatory Visit: Payer: Medicare HMO | Admitting: Family Medicine

## 2020-06-22 ENCOUNTER — Ambulatory Visit
Admission: RE | Admit: 2020-06-22 | Discharge: 2020-06-22 | Disposition: A | Discharge status: Home / Self Care | Payer: Medicare HMO | Source: Ambulatory Visit | Attending: Radiation Oncology | Admitting: Radiation Oncology

## 2020-06-22 VITALS — BP 134/75 | HR 95 | Temp 94.7°F | Resp 18 | Wt 194.8 lb

## 2020-06-22 DIAGNOSIS — Z923 Personal history of irradiation: Secondary | ICD-10-CM | POA: Insufficient documentation

## 2020-06-22 DIAGNOSIS — C61 Malignant neoplasm of prostate: Secondary | ICD-10-CM | POA: Insufficient documentation

## 2020-06-22 NOTE — Progress Notes (Signed)
Radiation Oncology Follow up Note  Name: Thomas [name]   Date:   06/22/2020 MRN:  [id number] DOB: 06-15-1945    This 75 y.o. male presents to the clinic today for 7-year follow-up in patient status post I-125 interstitial implant for Gleason 6 adenocarcinoma presenting with a PSA of 8.7.  REFERRING PROVIDER: [name], MD  HPI: Patient is a 75 year old male now at 7 years having pleated I-125 interstitial implant for Gleason 6 adenocarcinoma presenting with a PSA of 8.7..  His PSA has been elevated over the past 3 years up to 5.2-year ago dropped 2.27 with Eligard now back up to 4.71.  Continues have problems with chronic diarrhea which she developed 5 years after implant being seen by GI and related to prior radiation.  Patient does not follow any low residue diet  COMPLICATIONS OF TREATMENT: None  FOLLOW UP COMPLIANCE: Keeps appointments  PHYSICAL EXAM:  BP 134/75 (BP Location: Left Arm, Patient Position: Sitting)   Pulse 95   Temp (!) 94.7 F (34.8 C) (Tympanic)   Resp 18   Wt 194 lb 12.8 oz (88.4 kg)   BMI 33.70 kg/m  Well-developed well-nourished patient in NAD. HEENT reveals PERLA, EOMI, discs not visualized.  Oral cavity is clear. No oral mucosal lesions are identified. Neck is clear without evidence of cervical or supraclavicular adenopathy. Lungs are clear to A&P. Cardiac examination is essentially unremarkable with regular rate and rhythm without murmur rub or thrill. Abdomen is benign with no organomegaly or masses noted. Motor sensory and DTR levels are equal and symmetric in the upper and lower extremities. Cranial nerves II through XII are grossly intact. Proprioception is intact. No peripheral adenopathy or edema is identified. No motor or sensory levels are noted. Crude visual fields are within normal range.  RADIOLOGY RESULTS: No current films to review  PLAN: Present time I am referring the patient to medical oncology.  I like him to continue on androgen  deprivation therapy and explore any other possibilities for early biochemical failure and prostate cancer.  I have also suggested a low residue diet and again activity a yogurt.  He continues follow-up with GI for his chronic diarrhea.  I have asked to see him back in 6 months for follow-up.  Patient knows to call with any concerns.  I would like to take this opportunity to thank you for allowing me to participate in the care of your patient.Noreene Filbert, MD

## 2020-06-26 ENCOUNTER — Ambulatory Visit: Payer: Medicare HMO | Admitting: Family Medicine

## 2020-06-29 ENCOUNTER — Inpatient Hospital Stay: Payer: Medicare HMO

## 2020-06-29 ENCOUNTER — Inpatient Hospital Stay: Payer: Medicare HMO | Attending: Oncology | Admitting: Oncology

## 2020-06-29 ENCOUNTER — Encounter: Payer: Self-pay | Admitting: Oncology

## 2020-06-29 VITALS — BP 141/77 | HR 97 | Temp 97.5°F | Resp 18 | Wt 196.9 lb

## 2020-06-29 DIAGNOSIS — Z79899 Other long term (current) drug therapy: Secondary | ICD-10-CM | POA: Insufficient documentation

## 2020-06-29 DIAGNOSIS — C61 Malignant neoplasm of prostate: Secondary | ICD-10-CM | POA: Insufficient documentation

## 2020-06-29 DIAGNOSIS — E785 Hyperlipidemia, unspecified: Secondary | ICD-10-CM | POA: Diagnosis not present

## 2020-06-29 DIAGNOSIS — R109 Unspecified abdominal pain: Secondary | ICD-10-CM | POA: Diagnosis not present

## 2020-06-29 DIAGNOSIS — Z87891 Personal history of nicotine dependence: Secondary | ICD-10-CM | POA: Diagnosis not present

## 2020-06-29 DIAGNOSIS — E039 Hypothyroidism, unspecified: Secondary | ICD-10-CM | POA: Insufficient documentation

## 2020-06-29 DIAGNOSIS — Z79818 Long term (current) use of other agents affecting estrogen receptors and estrogen levels: Secondary | ICD-10-CM | POA: Diagnosis not present

## 2020-06-29 DIAGNOSIS — G4700 Insomnia, unspecified: Secondary | ICD-10-CM | POA: Insufficient documentation

## 2020-06-29 DIAGNOSIS — R5383 Other fatigue: Secondary | ICD-10-CM | POA: Diagnosis not present

## 2020-06-29 DIAGNOSIS — K219 Gastro-esophageal reflux disease without esophagitis: Secondary | ICD-10-CM | POA: Insufficient documentation

## 2020-06-29 DIAGNOSIS — R69 Illness, unspecified: Secondary | ICD-10-CM | POA: Diagnosis not present

## 2020-06-29 DIAGNOSIS — F329 Major depressive disorder, single episode, unspecified: Secondary | ICD-10-CM | POA: Diagnosis not present

## 2020-06-29 DIAGNOSIS — D509 Iron deficiency anemia, unspecified: Secondary | ICD-10-CM | POA: Diagnosis not present

## 2020-06-30 ENCOUNTER — Encounter: Payer: Self-pay | Admitting: Oncology

## 2020-06-30 DIAGNOSIS — C61 Malignant neoplasm of prostate: Secondary | ICD-10-CM | POA: Insufficient documentation

## 2020-06-30 NOTE — Progress Notes (Signed)
Hematology/Oncology Consult note [facility] Telephone:([phone] [phone]  Patient Care Team: [name], MD as PCP - General (Family Medicine)   Name of the patient: Thomas [name]  [id number]  02-Dec-1945    Reason for referral-prostate cancer   Referring physician-Dr. Baruch Gouty  Date of visit: 06/30/20   History of presenting illness-patient is a 75 year old male who was diagnosed with Gleason's 6 adenocarcinoma of the prostate about 7 years ago.  I do not have the biopsy report from 7 years ago.  He received I-125 interstitial implant for the same.  His presenting PSA at that time was 8.7.  He was last given a dose of Eligard in March 2021 following which his PSA dropped down from 5.28-0.27.  He did not receive any Eligard since then and PSA went back up to 4.7 about 2 weeks ago.  He has been referred to medical oncology for consideration of restarting ADT.  Patient lives alone and is depressed since his wife left him about 10 months ago.  He has baseline fatigue.  He has not had any recent hospitalizations.  Denies any new aches and pains anywhere.  ECOG PS- 2  Pain scale- 0   Review of systems- Review of Systems  Constitutional: Positive for malaise/fatigue. Negative for chills, fever and weight loss.  HENT: Negative for congestion, ear discharge and nosebleeds.   Eyes: Negative for blurred vision.  Respiratory: Negative for cough, hemoptysis, sputum production, shortness of breath and wheezing.   Cardiovascular: Negative for chest pain, palpitations, orthopnea and claudication.  Gastrointestinal: Negative for abdominal pain, blood in stool, constipation, diarrhea, heartburn, melena, nausea and vomiting.  Genitourinary: Negative for dysuria, flank pain, frequency, hematuria and urgency.  Musculoskeletal: Negative for back pain, joint pain and myalgias.  Skin: Negative for rash.  Neurological: Negative for dizziness, tingling, focal  weakness, seizures, weakness and headaches.  Endo/Heme/Allergies: Does not bruise/bleed easily.  Psychiatric/Behavioral: Negative for depression and suicidal ideas. The patient does not have insomnia.     Allergies  Allergen Reactions  . Augmentin [Amoxicillin-Pot Clavulanate]     Gi upset  . Ciprofloxacin   . Nsaids Other (See Comments)    Stomach ulcer     Patient Active Problem List   Diagnosis Date Noted  . Hypothyroidism 03/16/2019  . GERD (gastroesophageal reflux disease) 11/29/2015  . Insomnia 11/29/2015  . Prostate cancer ([place]) 12/30/2014  . Hyperlipidemia 12/30/2014  . Anemia 07/19/2013  . Iron deficiency 07/19/2013  . LUQ abdominal pain 07/19/2013  . Rectal bleeding 07/19/2013     Past Medical History:  Diagnosis Date  . Allergy    trees and plants  . Anemia    IDA  . Anxiety   . Asthma 1998  . Diverticulitis   . Elevated PSA   . GERD (gastroesophageal reflux disease)   . Hyperlipidemia   . Hypothyroidism 03/16/2019  . Prostate cancer [place])      Past Surgical History:  Procedure Laterality Date  . HERNIA REPAIR  1999  . prostate cancer    . TRANSURETHRAL RESECTION OF PROSTATE    . VASECTOMY      Social History   Socioeconomic History  . Marital status: Married    Spouse name: Not on file  . Number of children: Not on file  . Years of education: Not on file  . Highest education level: Not on file  Occupational History  . Not on file  Tobacco Use  . Smoking status: Former Smoker  Types: Cigarettes    Quit date: 04/22/1982    Years since quitting: 38.2  . Smokeless tobacco: Never Used  Vaping Use  . Vaping Use: Never used  Substance and Sexual Activity  . Alcohol use: Not Currently  . Drug use: Not Currently  . Sexual activity: Not on file  Other Topics Concern  . Not on file  Social History Narrative  . Not on file   Social Determinants of Health   Financial Resource Strain: Not on file  Food Insecurity: Not on file   Transportation Needs: Not on file  Physical Activity: Not on file  Stress: Not on file  Social Connections: Not on file  Intimate Partner Violence: Not on file     Family History  Problem Relation Age of Onset  . Heart disease Father   . Hypertension Brother   . Diabetes Brother      Current Outpatient Medications:  .  albuterol (VENTOLIN HFA) 108 (90 Base) MCG/ACT inhaler, TAKE 2 PUFFS BY MOUTH EVERY 6 HOURS AS NEEDED FOR WHEEZE, Disp: 18 g, Rfl: 1 .  Ascorbic Acid (VITAMIN C) 100 MG tablet, Take 100 mg by mouth daily., Disp: , Rfl:  .  cetirizine (ZYRTEC) 10 MG tablet, Take 10 mg by mouth daily., Disp: , Rfl:  .  co-enzyme Q-10 50 MG capsule, Take 50 mg by mouth daily., Disp: , Rfl:  .  diphenhydrAMINE (BENADRYL) 25 MG tablet, Take 25 mg by mouth every 6 (six) hours as needed., Disp: , Rfl:  .  ferrous sulfate 325 (65 FE) MG tablet, Take 325 mg by mouth daily with breakfast., Disp: , Rfl:  .  levothyroxine (SYNTHROID) 50 MCG tablet, Take 1 1/2 tablets on Monday, Wednesday and Friday and take 1 tablet all other days., Disp: 108 tablet, Rfl: 0 .  Melatonin 1 MG TABS, Take by mouth., Disp: , Rfl:  .  Multiple Vitamins-Minerals (MULTIVITAMIN WITH MINERALS) tablet, Take 1 tablet by mouth daily., Disp: , Rfl:  .  niacin 100 MG tablet, Take 100 mg by mouth at bedtime., Disp: , Rfl:  .  Omega-3 Fatty Acids (FISH OIL) 1000 MG CAPS, Take by mouth., Disp: , Rfl:  .  Red Yeast Rice Extract (RED YEAST RICE PO), Take by mouth., Disp: , Rfl:    Physical exam:  Vitals:   06/29/20 1442  BP: (!) 141/77  Pulse: 97  Resp: 18  Temp: (!) 97.5 F (36.4 C)  TempSrc: Tympanic  SpO2: 97%  Weight: 196 lb 14.4 oz (89.3 kg)   Physical Exam Constitutional:      General: He is not in acute distress. Cardiovascular:     Rate and Rhythm: Normal rate and regular rhythm.     Heart sounds: Normal heart sounds.  Pulmonary:     Effort: Pulmonary effort is normal.     Breath sounds: Normal breath  sounds.  Abdominal:     General: Bowel sounds are normal.     Palpations: Abdomen is soft.  Skin:    General: Skin is warm and dry.  Neurological:     Mental Status: He is alert and oriented to person, place, and time.        CMP Latest Ref Rng & Units 12/03/2019  Glucose 65 - 99 mg/dL 99  BUN 8 - 27 mg/dL 12  Creatinine 0.76 - 1.27 mg/dL 1.07  Sodium 134 - 144 mmol/L 141  Potassium 3.5 - 5.2 mmol/L 4.9  Chloride 96 - 106 mmol/L 103  CO2 20 -  29 mmol/L 25  Calcium 8.6 - 10.2 mg/dL 9.5  Total Protein 6.0 - 8.5 g/dL -  Total Bilirubin 0.0 - 1.2 mg/dL -  Alkaline Phos 39 - 117 IU/L -  AST 0 - 40 IU/L -  ALT 0 - 44 IU/L -   CBC Latest Ref Rng & Units 11/15/2019  WBC 4.0 - 10.5 K/uL 6.9  Hemoglobin 13.0 - 17.0 g/dL 13.7  Hematocrit 39.0 - 52.0 % 39.7  Platelets 150.0 - 400.0 K/uL 207.0    Assessment and plan- Patient is a 75 y.o. male referred for nonmetastatic prostate cancer for initiation of ADT  Patient was diagnosed with prostate cancer about 7 years ago.  He received interstitial implant for the same.More recently patient's PSA had gone up to 5.28 on 06/14/2019.  He received a 56-month dose of Lupron following which his PSA went down to 0.27.  He did not receive ADT following that.  PSA was again elevated at 4.71 following that.  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  He is not a candidate for prostatectomy.  I will restart his ADT at this time and he will come sometime next week to start Lupron.  I will see him back in 3 months with CBC with differential, CMP and PSA.  If there is a consistent rise in his PSA despite Lupron I will consider additional imaging at that time   Thank you for this kind referral and the opportunity to participate in the care of this patient   Visit Diagnosis 1. Malignant neoplasm of prostate [facility])     Dr. Randa Evens, MD, MPH [facility] [phone] 06/30/2020  2:40  PM

## 2020-07-03 ENCOUNTER — Telehealth: Payer: Self-pay | Admitting: Oncology

## 2020-07-03 NOTE — Telephone Encounter (Signed)
Called pt to schedule Lupron injection per MD. Patient confirmed appt on 3/17 at 1:30.

## 2020-07-05 ENCOUNTER — Telehealth: Payer: Self-pay

## 2020-07-05 DIAGNOSIS — E039 Hypothyroidism, unspecified: Secondary | ICD-10-CM

## 2020-07-05 DIAGNOSIS — E785 Hyperlipidemia, unspecified: Secondary | ICD-10-CM

## 2020-07-05 NOTE — Telephone Encounter (Signed)
Lipid, TSH, free T4-hyperlipidemia hypothyroidism

## 2020-07-05 NOTE — Telephone Encounter (Signed)
Patient was suppose to have Thyroid labs done in February but forgot and now orders have expired.  Did have recent labs with another doctor but I don't see thyroid labs with that.  Patient has appt with Dr. Nicki Reaper on March 30th and would like to have labs done next week so will have to have a new order put in.

## 2020-07-05 NOTE — Telephone Encounter (Signed)
Last completed labs on 12/03/19 BMET, PSA,  TSH. Please advise. Thank you

## 2020-07-05 NOTE — Telephone Encounter (Signed)
Blood work ordered in Epic. Patient notified. 

## 2020-07-06 ENCOUNTER — Inpatient Hospital Stay: Payer: Medicare HMO

## 2020-07-06 DIAGNOSIS — K219 Gastro-esophageal reflux disease without esophagitis: Secondary | ICD-10-CM | POA: Diagnosis not present

## 2020-07-06 DIAGNOSIS — Z79818 Long term (current) use of other agents affecting estrogen receptors and estrogen levels: Secondary | ICD-10-CM | POA: Diagnosis not present

## 2020-07-06 DIAGNOSIS — C61 Malignant neoplasm of prostate: Secondary | ICD-10-CM

## 2020-07-06 DIAGNOSIS — R109 Unspecified abdominal pain: Secondary | ICD-10-CM | POA: Diagnosis not present

## 2020-07-06 DIAGNOSIS — R5383 Other fatigue: Secondary | ICD-10-CM | POA: Diagnosis not present

## 2020-07-06 DIAGNOSIS — E785 Hyperlipidemia, unspecified: Secondary | ICD-10-CM | POA: Diagnosis not present

## 2020-07-06 DIAGNOSIS — E039 Hypothyroidism, unspecified: Secondary | ICD-10-CM | POA: Diagnosis not present

## 2020-07-06 DIAGNOSIS — D509 Iron deficiency anemia, unspecified: Secondary | ICD-10-CM | POA: Diagnosis not present

## 2020-07-06 DIAGNOSIS — G4700 Insomnia, unspecified: Secondary | ICD-10-CM | POA: Diagnosis not present

## 2020-07-06 DIAGNOSIS — R69 Illness, unspecified: Secondary | ICD-10-CM | POA: Diagnosis not present

## 2020-07-06 MED ORDER — LEUPROLIDE ACETATE (6 MONTH) 45 MG ~~LOC~~ KIT
45.0000 mg | PACK | Freq: Once | SUBCUTANEOUS | Status: AC
  Administered 2020-07-06: 45 mg via SUBCUTANEOUS
  Filled 2020-07-06: qty 45

## 2020-07-11 DIAGNOSIS — E039 Hypothyroidism, unspecified: Secondary | ICD-10-CM | POA: Diagnosis not present

## 2020-07-11 DIAGNOSIS — E785 Hyperlipidemia, unspecified: Secondary | ICD-10-CM | POA: Diagnosis not present

## 2020-07-12 DIAGNOSIS — C61 Malignant neoplasm of prostate: Secondary | ICD-10-CM | POA: Diagnosis not present

## 2020-07-12 DIAGNOSIS — J449 Chronic obstructive pulmonary disease, unspecified: Secondary | ICD-10-CM | POA: Diagnosis not present

## 2020-07-12 DIAGNOSIS — R030 Elevated blood-pressure reading, without diagnosis of hypertension: Secondary | ICD-10-CM | POA: Diagnosis not present

## 2020-07-12 DIAGNOSIS — E039 Hypothyroidism, unspecified: Secondary | ICD-10-CM | POA: Diagnosis not present

## 2020-07-12 DIAGNOSIS — K5900 Constipation, unspecified: Secondary | ICD-10-CM | POA: Diagnosis not present

## 2020-07-12 DIAGNOSIS — R69 Illness, unspecified: Secondary | ICD-10-CM | POA: Diagnosis not present

## 2020-07-12 DIAGNOSIS — M810 Age-related osteoporosis without current pathological fracture: Secondary | ICD-10-CM | POA: Diagnosis not present

## 2020-07-12 DIAGNOSIS — E669 Obesity, unspecified: Secondary | ICD-10-CM | POA: Diagnosis not present

## 2020-07-12 DIAGNOSIS — N529 Male erectile dysfunction, unspecified: Secondary | ICD-10-CM | POA: Diagnosis not present

## 2020-07-12 DIAGNOSIS — J309 Allergic rhinitis, unspecified: Secondary | ICD-10-CM | POA: Diagnosis not present

## 2020-07-12 LAB — LIPID PANEL
Chol/HDL Ratio: 5.4 ratio — ABNORMAL HIGH (ref 0.0–5.0)
Cholesterol, Total: 290 mg/dL — ABNORMAL HIGH (ref 100–199)
HDL: 54 mg/dL (ref >39)
LDL Chol Calc (NIH): 208 mg/dL — ABNORMAL HIGH (ref 0–99)
Triglycerides: 150 mg/dL — ABNORMAL HIGH (ref 0–149)
VLDL Cholesterol Cal: 28 mg/dL (ref 5–40)

## 2020-07-12 LAB — TSH: TSH: 4.75 u[IU]/mL — ABNORMAL HIGH (ref 0.450–4.500)

## 2020-07-12 LAB — T4, FREE: Free T4: 1.01 ng/dL (ref 0.82–1.77)

## 2020-07-19 ENCOUNTER — Ambulatory Visit (INDEPENDENT_AMBULATORY_CARE_PROVIDER_SITE_OTHER): Payer: Medicare HMO | Admitting: Family Medicine

## 2020-07-19 ENCOUNTER — Other Ambulatory Visit: Payer: Self-pay

## 2020-07-19 VITALS — BP 138/88 | HR 96 | Temp 98.1°F | Ht 63.75 in | Wt 194.0 lb

## 2020-07-19 DIAGNOSIS — R69 Illness, unspecified: Secondary | ICD-10-CM | POA: Diagnosis not present

## 2020-07-19 DIAGNOSIS — E038 Other specified hypothyroidism: Secondary | ICD-10-CM | POA: Diagnosis not present

## 2020-07-19 DIAGNOSIS — G4709 Other insomnia: Secondary | ICD-10-CM

## 2020-07-19 DIAGNOSIS — J4540 Moderate persistent asthma, uncomplicated: Secondary | ICD-10-CM | POA: Diagnosis not present

## 2020-07-19 DIAGNOSIS — E7849 Other hyperlipidemia: Secondary | ICD-10-CM

## 2020-07-19 DIAGNOSIS — F321 Major depressive disorder, single episode, moderate: Secondary | ICD-10-CM | POA: Diagnosis not present

## 2020-07-19 MED ORDER — LEVOTHYROXINE SODIUM 50 MCG PO TABS: ORAL_TABLET | ORAL | 1 refills | Status: DC

## 2020-07-19 MED ORDER — FLUTICASONE FUROATE-VILANTEROL 200-25 MCG/INH IN AEPB: INHALATION_SPRAY | RESPIRATORY_TRACT | 11 refills | Status: DC

## 2020-07-19 MED ORDER — TRIAMCINOLONE ACETONIDE 0.1 % EX CREA: 1.0000 "application " | TOPICAL_CREAM | Freq: Two times a day (BID) | CUTANEOUS | 4 refills | Status: DC

## 2020-07-19 MED ORDER — ALBUTEROL SULFATE HFA 108 (90 BASE) MCG/ACT IN AERS: INHALATION_SPRAY | RESPIRATORY_TRACT | 1 refills | Status: DC

## 2020-07-19 MED ORDER — BUSPIRONE HCL 10 MG PO TABS: 10.0000 mg | ORAL_TABLET | Freq: Two times a day (BID) | ORAL | 5 refills | Status: DC

## 2020-07-19 NOTE — Progress Notes (Signed)
Subjective:    Patient ID: Thomas [name], male    DOB: [birth year], 75 y.o.   MRN: [id number]  HPI  Patient comes in for 6 month follow up of chronic medical conditions Retired Apr 10 2021 C/o depression and would like a stronger inhaler due to frequent inhaler use patient finds that the albuterol helps but nonetheless he continues to have troubles.  Therefore he would like to have a stronger inhaler.  Energy level not so great.  Finds himself depressed.  Denies being suicidal but sometimes he wishes his life would end.  He does state that things will get better because he is going to be around more of his church friends.  Unfortunately his wife left him several months back and its been very difficult for him he is also dealing with cancer Fall Risk  07/19/2020 02/01/2020 05/21/2017 05/20/2016 11/29/2015  Falls in the past year? 0 0 No No No  Number falls in past yr: 0 0 - - -  Injury with Fall? 0 0 - - -  Follow up Falls evaluation completed Falls evaluation completed - - -    Review of Systems  Constitutional: Negative for activity change, fatigue and fever.  HENT: Negative for congestion and rhinorrhea.   Respiratory: Negative for cough and shortness of breath.   Cardiovascular: Negative for chest pain and leg swelling.  Gastrointestinal: Negative for abdominal pain, diarrhea and nausea.  Genitourinary: Negative for dysuria and hematuria.  Neurological: Negative for weakness and headaches.  Psychiatric/Behavioral: Negative for agitation and behavioral problems.       Objective:   Physical Exam Vitals reviewed.  Constitutional:      General: He is not in acute distress. HENT:     Head: Normocephalic and atraumatic.  Eyes:     General:        Right eye: No discharge.        Left eye: No discharge.  Neck:     Trachea: No tracheal deviation.  Cardiovascular:     Rate and Rhythm: Normal rate and regular rhythm.     Heart sounds: Normal heart sounds. No murmur  heard.   Pulmonary:     Effort: Pulmonary effort is normal. No respiratory distress.     Breath sounds: Normal breath sounds.  Skin:    General: Skin is warm and dry.  Neurological:     Mental Status: He is alert.     Coordination: Coordination normal.  Psychiatric:        Behavior: Behavior normal.           Assessment & Plan:  1. Other specified hypothyroidism Adjust medication because TSH is off 1-1/2 tablets on Saturday Sunday Monday Wednesday Friday, 1 tablet on Thursday and Tuesdays  2. Other hyperlipidemia Moderate elevation of LDL.  Patient does not tolerate statins.  Continue red rice  3. Other insomnia This is because of his stress and depression.  I encouraged him to seek help on the depression with counseling and consider medications patient does not want to do that states years ago he took medication for depression and he relates he was suicidal at that time.  He denies being suicidal now.  Does not want to be on medicines.  We did counsel him regarding the depression that if he gets worse to follow-up with [place] immediately or seek help ER.  4. Depression, major, single episode, moderate ([place]) Please see discussion above Patient denies being suicidal currently sometimes he feels like life would be  better off if he died but he states he will not take his life 5. Moderate persistent asthma without complication Patient does have a longtime history of coughing congestion shortness of breath O2 saturation 98% on: Room air.  Patient does have some moderate persistent asthma more than likely has COPD as well previous x-ray did not show COPD.  Currently we will try a new inhaler if that does not work I would recommend doing pulmonary function test with pulmonary consult for COPD evaluation Breo was prescribed once daily albuterol as needed We will do a 4-week follow-up on phone call to see if he is change his mind regarding antidepressants Recheck patient in approximately 3  to 4 weeks if not doing better otherwise follow-up in 6 months sooner problems

## 2020-07-20 ENCOUNTER — Telehealth: Payer: Self-pay | Admitting: Family Medicine

## 2020-07-20 NOTE — Telephone Encounter (Signed)
Fax from [facility] needing clarification on Triamcinolone 0.1%. Is this cream, lotion or ointment? Please advise. Thank you

## 2020-07-23 NOTE — Telephone Encounter (Signed)
Cream apply twice daily as needed, 45 g, no refill, triamcinolone 0.1%

## 2020-07-24 ENCOUNTER — Other Ambulatory Visit: Payer: Self-pay | Admitting: *Deleted

## 2020-07-24 MED ORDER — TRIAMCINOLONE ACETONIDE 0.1 % EX CREA: 1.0000 "application " | TOPICAL_CREAM | Freq: Two times a day (BID) | CUTANEOUS | 0 refills | Status: DC

## 2020-07-24 NOTE — Telephone Encounter (Signed)
Med sent to pharm 

## 2020-08-16 ENCOUNTER — Other Ambulatory Visit: Payer: Self-pay

## 2020-08-16 ENCOUNTER — Encounter: Payer: Self-pay | Admitting: Family Medicine

## 2020-08-16 ENCOUNTER — Encounter: Payer: Medicare HMO | Admitting: Family Medicine

## 2020-08-16 NOTE — Progress Notes (Signed)
   Subjective:    Patient ID: Thomas [name], male    DOB: 16-Dec-1945, 75 y.o.   MRN: [id number]  HPI Error patient canceled   Review of Systems     Objective:   Physical Exam        Assessment & Plan:   This encounter was created in error - please disregard.

## 2020-08-22 ENCOUNTER — Encounter: Payer: Self-pay | Admitting: Family Medicine

## 2020-08-22 ENCOUNTER — Ambulatory Visit (INDEPENDENT_AMBULATORY_CARE_PROVIDER_SITE_OTHER): Payer: Medicare HMO | Admitting: Family Medicine

## 2020-08-22 ENCOUNTER — Other Ambulatory Visit: Payer: Self-pay

## 2020-08-22 VITALS — BP 128/84 | Temp 98.1°F | Ht 63.75 in | Wt 188.2 lb

## 2020-08-22 DIAGNOSIS — G8929 Other chronic pain: Secondary | ICD-10-CM | POA: Diagnosis not present

## 2020-08-22 DIAGNOSIS — M545 Low back pain, unspecified: Secondary | ICD-10-CM | POA: Diagnosis not present

## 2020-08-22 DIAGNOSIS — C61 Malignant neoplasm of prostate: Secondary | ICD-10-CM | POA: Diagnosis not present

## 2020-08-22 NOTE — Progress Notes (Signed)
   Subjective:    Patient ID: Thomas [name], male    DOB: 24-Nov-1945, 75 y.o.   MRN: [id number]  HPI Patient arrives to discuss lower back pain and pain in both feet. Patient states he is not interested in surgery. Patient also states he has been having ringing in his ears. Relates pain discomfort in his back into his feet hurts with certain movements.  Uses Tylenol as needed Is being followed by cancer center for elevated PSA and prostate cancer Denies fever chills weight loss States at times he feels stressed Other times he relates feeling somewhat down Denies being depressed currently Also states at times his memory is not as good as it should be but denies getting lost or having difficulty driving.  He states he is able to do his housework shopping fixing food etc. Back pain does not wake him up at night Review of Systems     Objective:   Physical Exam  Lungs clear heart regular pulse normal BP good Subjective back pain patient does not want to do any work-up of this currently     Assessment & Plan:  Back pain-musculoskeletal but cannot rule out the possibility of prostate cancer being involved with this if he has ongoing troubles he should have this x-rayed We will send notification to his oncologist  Patient does live by himself at risk for depression but denies it currently  Cognitive function seems to be sounding but he does have short-term memory this is been shown by previous testing but I do not feel he has Alzheimer's at this point we will monitor

## 2020-08-23 ENCOUNTER — Ambulatory Visit: Payer: Medicare HMO | Admitting: Family Medicine

## 2020-09-07 ENCOUNTER — Encounter: Payer: Self-pay | Admitting: *Deleted

## 2020-10-03 ENCOUNTER — Inpatient Hospital Stay: Payer: Medicare HMO | Attending: Oncology

## 2020-10-03 ENCOUNTER — Encounter: Payer: Self-pay | Admitting: Oncology

## 2020-10-03 ENCOUNTER — Inpatient Hospital Stay (HOSPITAL_BASED_OUTPATIENT_CLINIC_OR_DEPARTMENT_OTHER): Payer: Medicare HMO | Admitting: Oncology

## 2020-10-03 ENCOUNTER — Other Ambulatory Visit: Payer: Self-pay

## 2020-10-03 VITALS — BP 135/114 | HR 89 | Temp 95.1°F | Resp 18 | Ht 63.0 in | Wt 185.7 lb

## 2020-10-03 DIAGNOSIS — C61 Malignant neoplasm of prostate: Secondary | ICD-10-CM

## 2020-10-03 DIAGNOSIS — E785 Hyperlipidemia, unspecified: Secondary | ICD-10-CM | POA: Insufficient documentation

## 2020-10-03 DIAGNOSIS — E039 Hypothyroidism, unspecified: Secondary | ICD-10-CM | POA: Insufficient documentation

## 2020-10-03 DIAGNOSIS — D509 Iron deficiency anemia, unspecified: Secondary | ICD-10-CM | POA: Diagnosis not present

## 2020-10-03 DIAGNOSIS — J45909 Unspecified asthma, uncomplicated: Secondary | ICD-10-CM | POA: Insufficient documentation

## 2020-10-03 DIAGNOSIS — Z79899 Other long term (current) drug therapy: Secondary | ICD-10-CM | POA: Diagnosis not present

## 2020-10-03 DIAGNOSIS — K219 Gastro-esophageal reflux disease without esophagitis: Secondary | ICD-10-CM | POA: Diagnosis not present

## 2020-10-03 LAB — COMPREHENSIVE METABOLIC PANEL
ALT: 17 U/L (ref 0–44)
AST: 17 U/L (ref 15–41)
Albumin: 4 g/dL (ref 3.5–5.0)
Alkaline Phosphatase: 76 U/L (ref 38–126)
Anion gap: 8 (ref 5–15)
BUN: 23 mg/dL (ref 8–23)
CO2: 27 mmol/L (ref 22–32)
Calcium: 9.2 mg/dL (ref 8.9–10.3)
Chloride: 104 mmol/L (ref 98–111)
Creatinine, Ser: 1.05 mg/dL (ref 0.61–1.24)
GFR, Estimated: >60 mL/min (ref >60)
Glucose, Bld: 103 mg/dL — ABNORMAL HIGH (ref 70–99)
Potassium: 4.1 mmol/L (ref 3.5–5.1)
Sodium: 139 mmol/L (ref 135–145)
Total Bilirubin: 0.6 mg/dL (ref 0.3–1.2)
Total Protein: 6.9 g/dL (ref 6.5–8.1)

## 2020-10-03 LAB — CBC WITH DIFFERENTIAL/PLATELET
Abs Immature Granulocytes: 0.03 10*3/uL (ref 0.00–0.07)
Basophils Absolute: 0 10*3/uL (ref 0.0–0.1)
Basophils Relative: 1 %
Eosinophils Absolute: 0.4 10*3/uL (ref 0.0–0.5)
Eosinophils Relative: 6 %
HCT: 41.8 % (ref 39.0–52.0)
Hemoglobin: 14.3 g/dL (ref 13.0–17.0)
Immature Granulocytes: 1 %
Lymphocytes Relative: 19 %
Lymphs Abs: 1.3 10*3/uL (ref 0.7–4.0)
MCH: 31.4 pg (ref 26.0–34.0)
MCHC: 34.2 g/dL (ref 30.0–36.0)
MCV: 91.7 fL (ref 80.0–100.0)
Monocytes Absolute: 0.7 10*3/uL (ref 0.1–1.0)
Monocytes Relative: 11 %
Neutro Abs: 4.1 10*3/uL (ref 1.7–7.7)
Neutrophils Relative %: 62 %
Platelets: 215 10*3/uL (ref 150–400)
RBC: 4.56 MIL/uL (ref 4.22–5.81)
RDW: 12.1 % (ref 11.5–15.5)
WBC: 6.5 10*3/uL (ref 4.0–10.5)
nRBC: 0 % (ref 0.0–0.2)

## 2020-10-03 LAB — PSA: Prostatic Specific Antigen: 0.34 ng/mL (ref 0.00–4.00)

## 2020-10-03 NOTE — Progress Notes (Signed)
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  I do not have the biopsy report from 7 years ago.  He received I-125 interstitial implant for the same.  His presenting PSA at that time was 8.7.  He was last given a dose of Eligard in March 2021 following which his PSA dropped down from 5.28-0.27.  He did not receive any Eligard since then and PSA went back up to 4.7 about 2 weeks ago.  He has been referred to medical oncology for consideration of restarting ADT.  Lupron restarted in March 2022  Interval history-patient is tolerating ADT well.  Reports occasional hot flashes but denies other complaints at this time  ECOG PS- 1 Pain scale- 0   Review of systems- Review of Systems  Constitutional:  Negative for chills, fever, malaise/fatigue and weight loss.  HENT:  Negative for congestion, ear discharge and nosebleeds.   Eyes:  Negative for blurred vision.  Respiratory:  Negative for cough, hemoptysis, sputum production, shortness of breath and wheezing.   Cardiovascular:  Negative for chest pain, palpitations, orthopnea and claudication.  Gastrointestinal:  Negative for abdominal pain, blood in stool, constipation, diarrhea, heartburn, melena, nausea and vomiting.  Genitourinary:  Negative for dysuria, flank pain, frequency, hematuria and urgency.  Musculoskeletal:  Negative for back pain, joint pain and myalgias.  Skin:  Negative for rash.  Neurological:  Negative for dizziness,  tingling, focal weakness, seizures, weakness and headaches.  Endo/Heme/Allergies:  Does not bruise/bleed easily.  Psychiatric/Behavioral:  Negative for depression and suicidal ideas. The patient does not have insomnia.     Allergies  Allergen Reactions   Augmentin [Amoxicillin-Pot Clavulanate]     Gi upset   Ciprofloxacin    Nsaids Other (See Comments)    Stomach ulcer      Past Medical History:  Diagnosis Date   Allergy    trees and plants   Anemia    IDA   Anxiety    Asthma 1998   Diverticulitis    Elevated PSA    GERD (gastroesophageal reflux disease)    Hyperlipidemia    Hypothyroidism 03/16/2019   Prostate cancer [facility])      Past Surgical History:  Procedure Laterality Date   HERNIA REPAIR  1999   prostate cancer     TRANSURETHRAL RESECTION OF PROSTATE     VASECTOMY      Social History   Socioeconomic History   Marital status: Married    Spouse name: Not on file   Number of children: Not on file   Years of education: Not on file   Highest education level: Not on file  Occupational History   Not on file  Tobacco Use   Smoking status: Former    Pack years: 0.00    Types: Cigarettes    Quit date: 04/22/1982    Years since quitting: 38.4   Smokeless tobacco: Never  Vaping Use   Vaping Use: Never used  Substance and Sexual Activity   Alcohol use: Not Currently   Drug use: Not Currently  Sexual activity: Not on file  Other Topics Concern   Not on file  Social History Narrative   Not on file   Social Determinants of Health   Financial Resource Strain: Not on file  Food Insecurity: Not on file  Transportation Needs: Not on file  Physical Activity: Not on file  Stress: Not on file  Social Connections: Not on file  Intimate Partner Violence: Not on file    Family History  Problem Relation Age of Onset   Heart disease Father    Hypertension Brother    Diabetes Brother      Current Outpatient Medications:    albuterol (VENTOLIN HFA) 108  (90 Base) MCG/ACT inhaler, TAKE 2 PUFFS BY MOUTH EVERY 6 HOURS AS NEEDED FOR WHEEZE, Disp: 18 g, Rfl: 1   Ascorbic Acid (VITAMIN C) 100 MG tablet, Take 100 mg by mouth daily., Disp: , Rfl:    busPIRone (BUSPAR) 10 MG tablet, Take 1 tablet (10 mg total) by mouth 2 (two) times daily., Disp: 60 tablet, Rfl: 5   cetirizine (ZYRTEC) 10 MG tablet, Take 10 mg by mouth daily., Disp: , Rfl:    co-enzyme Q-10 50 MG capsule, Take 50 mg by mouth daily., Disp: , Rfl:    diphenhydrAMINE (BENADRYL) 25 MG tablet, Take 25 mg by mouth every 6 (six) hours as needed., Disp: , Rfl:    ferrous sulfate 325 (65 FE) MG tablet, Take 325 mg by mouth daily with breakfast., Disp: , Rfl:    fluticasone furoate-vilanterol (BREO ELLIPTA) 200-25 MCG/INH AEPB, 1 puff q am daily, Disp: 1 each, Rfl: 11   levothyroxine (SYNTHROID) 50 MCG tablet, Take 1 1/2 tablets on M W Fri Sat Sun and take 1 on Tues and Thursday, Disp: 120 tablet, Rfl: 1   Melatonin 1 MG TABS, Take by mouth., Disp: , Rfl:    Multiple Vitamins-Minerals (MULTIVITAMIN WITH MINERALS) tablet, Take 1 tablet by mouth daily., Disp: , Rfl:    niacin 100 MG tablet, Take 100 mg by mouth at bedtime., Disp: , Rfl:    Omega-3 Fatty Acids (FISH OIL) 1000 MG CAPS, Take by mouth., Disp: , Rfl:    Red Yeast Rice Extract (RED YEAST RICE PO), Take by mouth., Disp: , Rfl:    triamcinolone (KENALOG) 0.1 %, Apply 1 application topically 2 (two) times daily., Disp: 45 g, Rfl: 0  Physical exam:  Vitals:   10/03/20 1042  BP: (!) 135/114  Pulse: 89  Resp: 18  Temp: (!) 95.1 F (35.1 C)  SpO2: 97%  Weight: 185 lb 11.2 oz (84.2 kg)  Height: 5\' 3"  (1.6 m)   Physical Exam Cardiovascular:     Rate and Rhythm: Normal rate and regular rhythm.     Heart sounds: Normal heart sounds.  Pulmonary:     Effort: Pulmonary effort is normal.     Breath sounds: Normal breath sounds.  Skin:    General: Skin is warm and dry.  Neurological:     Mental Status: He is alert and oriented to  person, place, and time.     CMP Latest Ref Rng & Units 10/03/2020  Glucose 70 - 99 mg/dL 103(H)  BUN 8 - 23 mg/dL 23  Creatinine 0.61 - 1.24 mg/dL 1.05  Sodium 135 - 145 mmol/L 139  Potassium 3.5 - 5.1 mmol/L 4.1  Chloride 98 - 111 mmol/L 104  CO2 22 - 32 mmol/L 27  Calcium 8.9 - 10.3 mg/dL 9.2  Total Protein 6.5 - 8.1 g/dL 6.9  Total Bilirubin 0.3 - 1.2 mg/dL 0.6  Alkaline Phos 38 - 126 U/L 76  AST 15 - 41 U/L 17  ALT 0 - 44 U/L 17   CBC Latest Ref Rng & Units 10/03/2020  WBC 4.0 - 10.5 K/uL 6.5  Hemoglobin 13.0 - 17.0 g/dL 14.3  Hematocrit 39.0 - 52.0 % 41.8  Platelets 150 - 400 K/uL 215    No images are attached to the encounter.  No results found.   Assessment and plan- Patient is a 75 y.o. male with castrate sensitive nonmetastatic prostate cancer on ADT here for routine follow-up  Patient's PSA went up to 4.7 in March 2022 when he was restarted on ADT. PSA from today is pending.  He will receive his next Lupron in September 2022.  My plan is to continue ADT for biochemical recurrence at this time.  There is a consistent rise in his PSA despite ADT I will consider additional scans and adding further oral antiandrogen therapies at that time.   Visit Diagnosis 1. Malignant neoplasm of prostate [facility])      Dr. Randa Evens, MD, MPH [facility] [phone] 10/03/2020 4:13 PM

## 2020-10-20 ENCOUNTER — Ambulatory Visit: Payer: Medicare HMO | Admitting: Family Medicine

## 2020-10-24 ENCOUNTER — Encounter: Payer: Self-pay | Admitting: Family Medicine

## 2020-10-30 ENCOUNTER — Telehealth: Payer: Self-pay | Admitting: Family Medicine

## 2020-10-30 MED ORDER — DOXYCYCLINE HYCLATE 100 MG PO TABS: ORAL_TABLET | ORAL | 0 refills | Status: DC

## 2020-10-30 NOTE — Telephone Encounter (Signed)
Recommend doxycycline 100 mg 1 twice daily for 10 days take with a snack and a tall glass of water Recommend virtual visit telephone or video tomorrow at 1140 to discuss positive COVID(I assume from this message that he has tested positive for COVID?)

## 2020-10-30 NOTE — Telephone Encounter (Signed)
Patient has appointment tomorrow tick bites but now has tested positive Covid today. He is wanting phone visit . Please advise appointment is at 4:10

## 2020-10-30 NOTE — Telephone Encounter (Signed)
Pt has appt for tomorrow for tick bites. Day before yesterday started having fever, congestion cough, first said no sob and then said maybe a little bit of sob when told he should get checked today he said he was breathing fine. and he thought it was from tick bit. States his sister was sick with same symptoms and he took home covid test yesterday and it was positive. Then took another home test today and it was positive. Pt states about one week ago pulled off 4 ticks then same day broke out in rash on legs, feet and stomach. Rash lasted most of the night. Used cortisone cream and it went away and left black spots on legs and feet. He said spots were sore. Pt worried he has [place] spotted fever.

## 2020-10-30 NOTE — Telephone Encounter (Signed)
Medication sent to pharmacy. Left message to return call 

## 2020-10-31 ENCOUNTER — Telehealth (INDEPENDENT_AMBULATORY_CARE_PROVIDER_SITE_OTHER): Payer: Medicare HMO | Admitting: Family Medicine

## 2020-10-31 ENCOUNTER — Ambulatory Visit: Payer: Medicare HMO | Admitting: Family Medicine

## 2020-10-31 ENCOUNTER — Other Ambulatory Visit: Payer: Self-pay

## 2020-10-31 DIAGNOSIS — U071 COVID-19: Secondary | ICD-10-CM

## 2020-10-31 NOTE — Progress Notes (Signed)
   Subjective:    Patient ID: Thomas [name], male    DOB: 1945/08/20, 75 y.o.   MRN: [id number] Telephone visit only HPI Home covid test positive yesterday. Having cough, congestion, low grade fever.  Symptoms been present over the past week tested positive yesterday denies wheezing or difficulty breathing  Started taking doxy yesterday for tick bites.  Patient had multiple tick bites that he had a red rash he was concerned about [facility] spotted fever but unable to come in to be seen doxycycline was sent to them but he did not take it because of concern for side effects pt concerned about doxy causing diarrhea. He read it on the insert and states he already has trouble with diarrhea for past 2 -3 years. Also concerned about other side effects he read on med insert.   Virtual Visit via Video Note  I connected with Thomas [name] on 10/31/20 at 11:40 AM EDT by a video enabled telemedicine application and verified that I am speaking with the correct person using two identifiers.  Location: Patient: home Provider: office   I discussed the limitations of evaluation and management by telemedicine and the availability of in person appointments. The patient expressed understanding and agreed to proceed.  History of Present Illness:    Observations/Objective:   Assessment and Plan:   Follow Up Instructions:    I discussed the assessment and treatment plan with the patient. The patient was provided an opportunity to ask questions and all were answered. The patient agreed with the plan and demonstrated an understanding of the instructions.   The patient was advised to call back or seek an in-person evaluation if the symptoms worsen or if the condition fails to improve as anticipated.  I provided 15 minutes of non-face-to-face time during this encounter.    Review of Systems     Objective:   Physical Exam  Telephone visit video not possible      Assessment & Plan:   Upon further discussion with the patient he is not having any fevers body aches abdominal pain or headaches associated with the tick bites I do not recommend the doxycycline  COVID infection should gradually get better he has had his vaccine he is beyond the window of opportunity for the antivirals.  Supportive measures were discussed

## 2020-10-31 NOTE — Telephone Encounter (Signed)
Patient had phone visit today at 11:30 am to discuss with Dr Nicki Reaper

## 2020-11-06 ENCOUNTER — Other Ambulatory Visit: Payer: Self-pay | Admitting: Gastroenterology

## 2020-11-06 DIAGNOSIS — R197 Diarrhea, unspecified: Secondary | ICD-10-CM

## 2020-11-20 ENCOUNTER — Ambulatory Visit: Payer: Medicare HMO | Admitting: Family Medicine

## 2020-11-24 ENCOUNTER — Other Ambulatory Visit: Payer: Self-pay

## 2020-11-24 MED ORDER — LEVOTHYROXINE SODIUM 50 MCG PO TABS: ORAL_TABLET | ORAL | 1 refills | Status: DC

## 2020-12-18 ENCOUNTER — Other Ambulatory Visit: Payer: Self-pay

## 2020-12-18 ENCOUNTER — Ambulatory Visit (INDEPENDENT_AMBULATORY_CARE_PROVIDER_SITE_OTHER): Payer: Medicare HMO | Admitting: *Deleted

## 2020-12-18 DIAGNOSIS — Z0000 Encounter for general adult medical examination without abnormal findings: Secondary | ICD-10-CM

## 2020-12-18 NOTE — Progress Notes (Deleted)
I connected with  Thomas [name] on 12/18/20 by an audio only telemedicine application and verified that I am speaking with the correct person using two identifiers.   I discussed the limitations, risks, security and privacy concerns of performing an evaluation and management service by telephone and the availability of in person appointments. I also discussed with the patient that there may be a patient responsible charge related to this service. The patient expressed understanding and verbally consented to this telephonic visit.  Location of Patient:  Location of Provider:  List any persons and their role that are participating in the visit with the patient.       Subjective:   Thomas [name] is a 75 y.o. male who presents for Medicare Annual/Subsequent preventive examination.  Review of Systems    ***       Objective:    There were no vitals filed for this visit. There is no height or weight on file to calculate BMI.  Advanced Directives 06/29/2020 06/22/2020 06/21/2020 12/22/2019 06/23/2019 05/31/2019 06/15/2018  Does Patient Have a Medical Advance Directive? Yes Yes Yes No No No No  Type of Advance Directive - Healthcare Power of [place];Living will [place];Living will - - - -  Does patient want to make changes to medical advance directive? - - No - Patient declined - - - -  Copy of [place] in Chart? - No - copy requested No - copy requested - - - -  Would patient like information on creating a medical advance directive? - - - No - Patient declined No - Patient declined No - Patient declined No - Patient declined    Current Medications (verified) Outpatient Encounter Medications as of 12/18/2020  Medication Sig  . albuterol (VENTOLIN HFA) 108 (90 Base) MCG/ACT inhaler TAKE 2 PUFFS BY MOUTH EVERY 6 HOURS AS NEEDED FOR WHEEZE  . Ascorbic Acid (VITAMIN C) 100 MG tablet Take 100 mg by mouth daily.  . busPIRone (BUSPAR) 10 MG tablet Take 1  tablet (10 mg total) by mouth 2 (two) times daily.  . cetirizine (ZYRTEC) 10 MG tablet Take 10 mg by mouth daily.  Marland Kitchen co-enzyme Q-10 50 MG capsule Take 50 mg by mouth daily.  . diphenhydrAMINE (BENADRYL) 25 MG tablet Take 25 mg by mouth every 6 (six) hours as needed.  . doxycycline (VIBRA-TABS) 100 MG tablet Take one tablet po BID for 10 days. Take with snack and tall glass of water  . ferrous sulfate 325 (65 FE) MG tablet Take 325 mg by mouth daily with breakfast.  . fluticasone furoate-vilanterol (BREO ELLIPTA) 200-25 MCG/INH AEPB 1 puff q am daily  . levothyroxine (SYNTHROID) 50 MCG tablet Take 1 1/2 tablets on M W Fri Sat Sun and take 1 on Tues and Thursday  . Melatonin 1 MG TABS Take by mouth.  . Multiple Vitamins-Minerals (MULTIVITAMIN WITH MINERALS) tablet Take 1 tablet by mouth daily.  . niacin 100 MG tablet Take 100 mg by mouth at bedtime.  . Omega-3 Fatty Acids (FISH OIL) 1000 MG CAPS Take by mouth.  . Red Yeast Rice Extract (RED YEAST RICE PO) Take by mouth.  . triamcinolone (KENALOG) 0.1 % Apply 1 application topically 2 (two) times daily.   No facility-administered encounter medications on file as of 12/18/2020.    Allergies (verified) Augmentin [amoxicillin-pot clavulanate], Ciprofloxacin, and Nsaids   History: Past Medical History:  Diagnosis Date  . Allergy    trees and plants  . Anemia  IDA  . Anxiety   . Asthma 1998  . Diverticulitis   . Elevated PSA   . GERD (gastroesophageal reflux disease)   . Hyperlipidemia   . Hypothyroidism 03/16/2019  . Prostate cancer [facility])    Past Surgical History:  Procedure Laterality Date  . HERNIA REPAIR  1999  . prostate cancer    . TRANSURETHRAL RESECTION OF PROSTATE    . VASECTOMY     Family History  Problem Relation Age of Onset  . Heart disease Father   . Hypertension Brother   . Diabetes Brother    Social History   Socioeconomic History  . Marital status: Married    Spouse name: Not on file  . Number of  children: Not on file  . Years of education: Not on file  . Highest education level: Not on file  Occupational History  . Not on file  Tobacco Use  . Smoking status: Former    Types: Cigarettes    Quit date: 04/22/1982    Years since quitting: 38.6  . Smokeless tobacco: Never  Vaping Use  . Vaping Use: Never used  Substance and Sexual Activity  . Alcohol use: Not Currently  . Drug use: Not Currently  . Sexual activity: Not on file  Other Topics Concern  . Not on file  Social History Narrative  . Not on file   Social Determinants of Health   Financial Resource Strain: Not on file  Food Insecurity: Not on file  Transportation Needs: Not on file  Physical Activity: Not on file  Stress: Not on file  Social Connections: Not on file    Tobacco Counseling Counseling given: Not Answered   Clinical Intake:                 Diabetic?***         Activities of Daily Living No flowsheet data found.  Patient Care Team: [name], MD as PCP - General (Family Medicine)  Indicate any recent Medical Services you may have received from other than [facility] providers in the past year (date may be approximate).     Assessment:   This is a routine wellness examination for [facility].  Hearing/Vision screen No results found.  Dietary issues and exercise activities discussed:     Goals Addressed   None   Depression Screen PHQ 2/9 Scores 07/19/2020 07/19/2020 02/01/2020 05/21/2017 05/20/2016 11/29/2015 03/29/2015  PHQ - 2 Score '6 6 2 '$ 0 0 0 0  PHQ- 9 Score '20 20 5 '$ - - - -    Fall Risk Fall Risk  08/22/2020 07/19/2020 02/01/2020 05/21/2017 05/20/2016  Falls in the past year? 0 0 0 No No  Number falls in past yr: - 0 0 - -  Injury with Fall? - 0 0 - -  Risk for fall due to : No Fall Risks - - - -  Follow up Falls evaluation completed Falls evaluation completed Falls evaluation completed - -    FALL RISK PREVENTION PERTAINING TO THE HOME:  Any stairs in or around the home?  {YES/NO:21197}{} If so, are there any without handrails? {YES/NO:21197}{} Home free of loose throw rugs in walkways, pet beds, electrical cords, etc? {YES/NO:21197}{} Adequate lighting in your home to reduce risk of falls? {YES/NO:21197}{}  ASSISTIVE DEVICES UTILIZED TO PREVENT FALLS:  Life alert? {YES/NO:21197}{} Use of a cane, walker or w/c? {YES/NO:21197}{} Grab bars in the bathroom? {YES/NO:21197}{} Shower chair or bench in shower? {YES/NO:21197}{} Elevated toilet seat or a handicapped toilet? {YES/NO:21197}{}  TIMED UP AND GO:  Was the test performed? {YES/NO:21197}{}.  Length of time to ambulate 10 feet: *** sec.   {Appearance of Gait:2101803}{}  Cognitive Function:        Immunizations Immunization History  Administered Date(s) Administered  . Fluad Quad(high Dose 65+) 02/01/2020  . PFIZER(Purple Top)SARS-COV-2 Vaccination 05/15/2019, 06/07/2019  . Pneumococcal Conjugate-13 04/26/2015  . Pneumococcal Polysaccharide-23 03/09/2014    {TDAP status:2101805}{}  {Flu Vaccine status:2101806}{}  {Pneumococcal vaccine status:2101807}{}  {Covid-19 vaccine status:2101808}{}  Qualifies for Shingles Vaccine? {YES/NO:21197}{}  Zostavax completed {YES/NO:21197}{}  {Shingrix Completed?:2101804}{}  Screening Tests Health Maintenance  Topic Date Due  . Hepatitis C Screening  Never done  . TETANUS/TDAP  Never done  . Zoster Vaccines- Shingrix (1 of 2) Never done  . COVID-19 Vaccine (3 - Pfizer risk series) 07/05/2019  . INFLUENZA VACCINE  11/20/2020  . PNA vac Low Risk Adult  Completed  . HPV VACCINES  Aged Out    Health Maintenance  Health Maintenance Due  Topic Date Due  . Hepatitis C Screening  Never done  . TETANUS/TDAP  Never done  . Zoster Vaccines- Shingrix (1 of 2) Never done  . COVID-19 Vaccine (3 - Pfizer risk series) 07/05/2019  . INFLUENZA VACCINE  11/20/2020    {Colorectal cancer screening:2101809}{}  Lung Cancer Screening: (Low Dose CT Chest  recommended if Age 61-80 years, 30 pack-year currently smoking OR have quit w/in 15years.) {DOES NOT does:27190::"does not"}{} qualify.   Lung Cancer Screening Referral: ***  Additional Screening:  Hepatitis C Screening: {DOES NOT does:27190::"does not"}{} qualify; Completed ***  Vision Screening: Recommended annual ophthalmology exams for early detection of glaucoma and other disorders of the eye. Is the patient up to date with their annual eye exam?  {YES/NO:21197}{} Who is the provider or what is the name of the office in which the patient attends annual eye exams? *** If pt is not established with a provider, would they like to be referred to a provider to establish care? {YES/NO:21197}{}.   Dental Screening: Recommended annual dental exams for proper oral hygiene  Community Resource Referral / Chronic Care Management: CRR required this visit?  {YES/NO:21197}{}  CCM required this visit?  {YES/NO:21197}{}     Plan:     I have personally reviewed and noted the following in the patient's chart:   Medical and social history Use of alcohol, tobacco or illicit drugs  Current medications and supplements including opioid prescriptions. {Opioid Prescriptions:[phone]}{} Functional ability and status Nutritional status Physical activity Advanced directives List of other physicians Hospitalizations, surgeries, and ER visits in previous 12 months Vitals Screenings to include cognitive, depression, and falls Referrals and appointments  In addition, I have reviewed and discussed with patient certain preventive protocols, quality metrics, and best practice recommendations. A written personalized care plan for preventive services as well as general preventive health recommendations were provided to patient.     [place], [place]   12/18/2020   Nurse Notes: ***

## 2020-12-18 NOTE — Progress Notes (Addendum)
Subjective:   Thomas [name] is a 75 y.o. male who presents for Medicare Annual/Subsequent preventive examination.  I connected with  Thomas [name] on 12/20/20 by a telemedicine application and verified that I am speaking with the correct person using two identifiers.   I discussed the limitations of evaluation and management by telemedicine. The patient expressed understanding and agreed to proceed.  Location of patient: Home Location of Provider: Telephone Persons's participating in [place] (patient) Reiko Vinje (RMA)          Objective:    There were no vitals filed for this visit. There is no height or weight on file to calculate BMI.  Advanced Directives 06/29/2020 06/22/2020 06/21/2020 12/22/2019 06/23/2019 05/31/2019 06/15/2018  Does Patient Have a Medical Advance Directive? Yes Yes Yes No No No No  Type of Advance Directive - Healthcare Power of [place];Living will [place];Living will - - - -  Does patient want to make changes to medical advance directive? - - No - Patient declined - - - -  Copy of [place] in Chart? - No - copy requested No - copy requested - - - -  Would patient like information on creating a medical advance directive? - - - No - Patient declined No - Patient declined No - Patient declined No - Patient declined    Current Medications (verified) Outpatient Encounter Medications as of 12/18/2020  Medication Sig   albuterol (VENTOLIN HFA) 108 (90 Base) MCG/ACT inhaler TAKE 2 PUFFS BY MOUTH EVERY 6 HOURS AS NEEDED FOR WHEEZE   Ascorbic Acid (VITAMIN C) 100 MG tablet Take 100 mg by mouth daily.   busPIRone (BUSPAR) 10 MG tablet Take 1 tablet (10 mg total) by mouth 2 (two) times daily.   cetirizine (ZYRTEC) 10 MG tablet Take 10 mg by mouth daily.   co-enzyme Q-10 50 MG capsule Take 50 mg by mouth daily.   diphenhydrAMINE (BENADRYL) 25 MG tablet Take 25 mg by mouth every 6 (six) hours as needed.   doxycycline  (VIBRA-TABS) 100 MG tablet Take one tablet po BID for 10 days. Take with snack and tall glass of water   ferrous sulfate 325 (65 FE) MG tablet Take 325 mg by mouth daily with breakfast.   FIBER PO Take 1 tablet by mouth daily.   fluticasone furoate-vilanterol (BREO ELLIPTA) 200-25 MCG/INH AEPB 1 puff q am daily   levothyroxine (SYNTHROID) 50 MCG tablet Take 1 1/2 tablets on M W Fri Sat Sun and take 1 on Tues and Thursday   Melatonin 1 MG TABS Take by mouth.   Multiple Vitamins-Minerals (MULTIVITAMIN WITH MINERALS) tablet Take 1 tablet by mouth daily.   niacin 100 MG tablet Take 100 mg by mouth at bedtime.   Omega-3 Fatty Acids (FISH OIL) 1000 MG CAPS Take by mouth.   Red Yeast Rice Extract (RED YEAST RICE PO) Take by mouth.   triamcinolone (KENALOG) 0.1 % Apply 1 application topically 2 (two) times daily.   No facility-administered encounter medications on file as of 12/18/2020.    Allergies (verified) Augmentin [amoxicillin-pot clavulanate], Ciprofloxacin, and Nsaids   History: Past Medical History:  Diagnosis Date   Allergy    trees and plants   Anemia    IDA   Anxiety    Asthma 1998   Diverticulitis    Elevated PSA    GERD (gastroesophageal reflux disease)    Hyperlipidemia    Hypothyroidism 03/16/2019   Prostate cancer ([place])  Past Surgical History:  Procedure Laterality Date   HERNIA REPAIR  1999   prostate cancer     TRANSURETHRAL RESECTION OF PROSTATE     VASECTOMY     Family History  Problem Relation Age of Onset   Heart disease Father    Hypertension Brother    Diabetes Brother    Social History   Socioeconomic History   Marital status: Married    Spouse name: Not on file   Number of children: Not on file   Years of education: Not on file   Highest education level: Not on file  Occupational History   Not on file  Tobacco Use   Smoking status: Former    Types: Cigarettes    Quit date: 04/22/1982    Years since quitting: 38.6   Smokeless tobacco:  Never  Vaping Use   Vaping Use: Never used  Substance and Sexual Activity   Alcohol use: Not Currently   Drug use: Not Currently   Sexual activity: Not on file  Other Topics Concern   Not on file  Social History Narrative   Not on file   Social Determinants of Health   Financial Resource Strain: Not on file  Food Insecurity: Not on file  Transportation Needs: Not on file  Physical Activity: Not on file  Stress: Not on file  Social Connections: Not on file    Tobacco Counseling Counseling given: Not Answered   Clinical Intake:  Pre-visit preparation completed: Yes  Pain : No/denies pain        How often do you need to have someone help you when you read instructions, pamphlets, or other written materials from your doctor or pharmacy?: 1 - Never  Diabetic? No  Interpreter Needed?: No      Activities of Daily Living In your present state of health, do you have any difficulty performing the following activities: 12/18/2020  Hearing? N  Vision? N  Difficulty concentrating or making decisions? N  Walking or climbing stairs? N  Dressing or bathing? N  Doing errands, shopping? N  Preparing Food and eating ? N  Using the Toilet? N  In the past six months, have you accidently leaked urine? N  Do you have problems with loss of bowel control? N  Comment pt takes fiber for constipation  Managing your Medications? N  Managing your Finances? N  Housekeeping or managing your Housekeeping? N  Some recent data might be hidden    Patient Care Team: [name], MD as PCP - General (Family Medicine)  Indicate any recent Medical Services you may have received from other than [facility] providers in the past year (date may be approximate).     Assessment:   This is a routine wellness examination for Thomas [name].  Hearing/Vision screen No results found.  Dietary issues and exercise activities discussed: Current Exercise Habits: The patient does not participate in regular  exercise at present, Exercise limited by: None identified;Other - see comments (75 years old)   Goals Addressed   None    Depression Screen PHQ 2/9 Scores 12/18/2020 07/19/2020 07/19/2020 02/01/2020 05/21/2017 05/20/2016 11/29/2015  PHQ - 2 Score '2 6 6 2 '$ 0 0 0  PHQ- 9 Score '3 20 20 5 '$ - - -    Fall Risk Fall Risk  12/18/2020 08/22/2020 07/19/2020 02/01/2020 05/21/2017  Falls in the past year? 0 0 0 0 No  Number falls in past yr: 0 - 0 0 -  Injury with Fall? 0 - 0  0 -  Risk for fall due to : No Fall Risks;Impaired vision No Fall Risks - - -  Follow up Falls evaluation completed Falls evaluation completed Falls evaluation completed Falls evaluation completed -    FALL RISK PREVENTION PERTAINING TO THE HOME:  Any stairs in or around the home? No  If so, are there any without handrails? N/A Home free of loose throw rugs in walkways, pet beds, electrical cords, etc? No  Adequate lighting in your home to reduce risk of falls? Yes   ASSISTIVE DEVICES UTILIZED TO PREVENT FALLS:  Life alert? No  Use of a cane, walker or w/c? Yes  Grab bars in the bathroom? No  Shower chair or bench in shower? No  Elevated toilet seat or a handicapped toilet? No   TIMED UP AND GO:  Was the test performed? N/A Length of time to ambulate 10 feet: 0  sec.   Gait slow and steady with assistive device  Cognitive Function:     6CIT Screen 12/18/2020  What Year? 0 points  What month? 0 points  What time? 0 points  Count back from 20 0 points  Months in reverse 0 points  Repeat phrase 0 points  Total Score 0    Immunizations Immunization History  Administered Date(s) Administered   Fluad Quad(high Dose 65+) 02/01/2020   PFIZER(Purple Top)SARS-COV-2 Vaccination 05/15/2019, 06/07/2019   Pneumococcal Conjugate-13 04/26/2015   Pneumococcal Polysaccharide-23 03/09/2014    TDAP status: Up to date  Flu Vaccine status: Up to date  Pneumococcal vaccine status: Up to date  Covid-19 vaccine status:  Completed vaccines  Qualifies for Shingles Vaccine? Not sure when he had last one Zostavax completed n/a Shingrix Completed?: No.    Education has been provided regarding the importance of this vaccine. Patient has been advised to call insurance company to determine out of pocket expense if they have not yet received this vaccine. Advised may also receive vaccine at local pharmacy or Health Dept. Verbalized acceptance and understanding.  Screening Tests Health Maintenance  Topic Date Due   Hepatitis C Screening  Never done   TETANUS/TDAP  Never done   Zoster Vaccines- Shingrix (1 of 2) Never done   COVID-19 Vaccine (3 - Pfizer risk series) 07/05/2019   INFLUENZA VACCINE  11/20/2020   PNA vac Low Risk Adult  Completed   HPV VACCINES  Aged Out    Health Maintenance  Health Maintenance Due  Topic Date Due   Hepatitis C Screening  Never done   TETANUS/TDAP  Never done   Zoster Vaccines- Shingrix (1 of 2) Never done   COVID-19 Vaccine (3 - Pfizer risk series) 07/05/2019   INFLUENZA VACCINE  11/20/2020    Colorectal cancer screening: Type of screening: Colonoscopy. Completed 2022. Repeat every   years  Lung Cancer Screening: (Low Dose CT Chest recommended if Age 81-80 years, 30 pack-year currently smoking OR have quit w/in 15years.) does qualify.   Lung Cancer Screening Referral: n/a  Additional Screening:  Hepatitis C Screening: does not qualify; Completed aged out  Vision Screening: Recommended annual ophthalmology exams for early detection of glaucoma and other disorders of the eye. Is the patient up to date with their annual eye exam?  Yes  Who is the provider or what is the name of the office in which the patient attends annual eye exams? Patient forgot provider name but will call us to update  If pt is not established with a provider, would they like to be referred to  a provider to establish care?  Established with a provider .   Dental Screening: Recommended annual dental  exams for proper oral hygiene  Community Resource Referral / Chronic Care Management: CRR required this visit?  No   CCM required this visit?  No      Plan:     I have personally reviewed and noted the following in the patient's chart:   Medical and social history Use of alcohol, tobacco or illicit drugs  Current medications and supplements including opioid prescriptions. Patient is not currently taking opioid prescriptions. Functional ability and status Nutritional status Physical activity Advanced directives List of other physicians Hospitalizations, surgeries, and ER visits in previous 12 months Vitals Screenings to include cognitive, depression, and falls Referrals and appointments  In addition, I have reviewed and discussed with patient certain preventive protocols, quality metrics, and best practice recommendations. A written personalized care plan for preventive services as well as general preventive health recommendations were provided to patient.     Talicia Sui [place], [place]   12/20/2020   Nurse Notes: Non face to face 1 hr visit   Mr. Sharlett Iles , Thank you for taking time to come for your Medicare Wellness Visit. I appreciate your ongoing commitment to your health goals. Please review the following plan we discussed and let me know if I can assist you in the future.   These are the goals we discussed:  Goals   None     This is a list of the screening recommended for you and due dates:  Health Maintenance  Topic Date Due   Hepatitis C Screening: USPSTF Recommendation to screen - Ages 43-79 yo.  Never done   Tetanus Vaccine  Never done   Zoster (Shingles) Vaccine (1 of 2) Never done   COVID-19 Vaccine (3 - Pfizer risk series) 07/05/2019   Flu Shot  11/20/2020   Pneumonia vaccines  Completed   HPV Vaccine  Aged Out

## 2020-12-18 NOTE — Progress Notes (Deleted)
Acute Office Visit  Subjective:    Patient ID: Thomas [name], male    DOB: [birth year], 75 y.o.   MRN: [id number]  No chief complaint on file.   HPI Patient is in today for ***  Past Medical History:  Diagnosis Date   Allergy    trees and plants   Anemia    IDA   Anxiety    Asthma 1998   Diverticulitis    Elevated PSA    GERD (gastroesophageal reflux disease)    Hyperlipidemia    Hypothyroidism 03/16/2019   Prostate cancer [facility])     Past Surgical History:  Procedure Laterality Date   HERNIA REPAIR  1999   prostate cancer     TRANSURETHRAL RESECTION OF PROSTATE     VASECTOMY      Family History  Problem Relation Age of Onset   Heart disease Father    Hypertension Brother    Diabetes Brother     Social History   Socioeconomic History   Marital status: Married    Spouse name: Not on file   Number of children: Not on file   Years of education: Not on file   Highest education level: Not on file  Occupational History   Not on file  Tobacco Use   Smoking status: Former    Types: Cigarettes    Quit date: 04/22/1982    Years since quitting: 38.6   Smokeless tobacco: Never  Vaping Use   Vaping Use: Never used  Substance and Sexual Activity   Alcohol use: Not Currently   Drug use: Not Currently   Sexual activity: Not on file  Other Topics Concern   Not on file  Social History Narrative   Not on file   Social Determinants of Health   Financial Resource Strain: Not on file  Food Insecurity: Not on file  Transportation Needs: Not on file  Physical Activity: Not on file  Stress: Not on file  Social Connections: Not on file  Intimate Partner Violence: Not on file    Outpatient Medications Prior to Visit  Medication Sig Dispense Refill   albuterol (VENTOLIN HFA) 108 (90 Base) MCG/ACT inhaler TAKE 2 PUFFS BY MOUTH EVERY 6 HOURS AS NEEDED FOR WHEEZE 18 g 1   Ascorbic Acid (VITAMIN C) 100 MG tablet Take 100 mg by mouth daily.     busPIRone (BUSPAR)  10 MG tablet Take 1 tablet (10 mg total) by mouth 2 (two) times daily. 60 tablet 5   cetirizine (ZYRTEC) 10 MG tablet Take 10 mg by mouth daily.     co-enzyme Q-10 50 MG capsule Take 50 mg by mouth daily.     diphenhydrAMINE (BENADRYL) 25 MG tablet Take 25 mg by mouth every 6 (six) hours as needed.     doxycycline (VIBRA-TABS) 100 MG tablet Take one tablet po BID for 10 days. Take with snack and tall glass of water 20 tablet 0   ferrous sulfate 325 (65 FE) MG tablet Take 325 mg by mouth daily with breakfast.     fluticasone furoate-vilanterol (BREO ELLIPTA) 200-25 MCG/INH AEPB 1 puff q am daily 1 each 11   levothyroxine (SYNTHROID) 50 MCG tablet Take 1 1/2 tablets on M W Fri Sat Sun and take 1 on Tues and Thursday 120 tablet 1   Melatonin 1 MG TABS Take by mouth.     Multiple Vitamins-Minerals (MULTIVITAMIN WITH MINERALS) tablet Take 1 tablet by mouth daily.     niacin 100 MG tablet  Take 100 mg by mouth at bedtime.     Omega-3 Fatty Acids (FISH OIL) 1000 MG CAPS Take by mouth.     Red Yeast Rice Extract (RED YEAST RICE PO) Take by mouth.     triamcinolone (KENALOG) 0.1 % Apply 1 application topically 2 (two) times daily. 45 g 0   No facility-administered medications prior to visit.    Allergies  Allergen Reactions   Augmentin [Amoxicillin-Pot Clavulanate]     Gi upset   Ciprofloxacin    Nsaids Other (See Comments)    Stomach ulcer     Review of Systems     Objective:    Physical Exam  There were no vitals taken for this visit. Wt Readings from Last 3 Encounters:  10/03/20 185 lb 11.2 oz (84.2 kg)  08/22/20 188 lb 3.2 oz (85.4 kg)  07/19/20 194 lb (88 kg)    Health Maintenance Due  Topic Date Due   Hepatitis C Screening  Never done   TETANUS/TDAP  Never done   Zoster Vaccines- Shingrix (1 of 2) Never done   COVID-19 Vaccine (3 - Pfizer risk series) 07/05/2019   INFLUENZA VACCINE  11/20/2020    There are no preventive care reminders to display for this  patient.   Lab Results  Component Value Date   TSH 4.750 (H) 07/11/2020   Lab Results  Component Value Date   WBC 6.5 10/03/2020   HGB 14.3 10/03/2020   HCT 41.8 10/03/2020   MCV 91.7 10/03/2020   PLT 215 10/03/2020   Lab Results  Component Value Date   NA 139 10/03/2020   K 4.1 10/03/2020   CO2 27 10/03/2020   GLUCOSE 103 (H) 10/03/2020   BUN 23 10/03/2020   CREATININE 1.05 10/03/2020   BILITOT 0.6 10/03/2020   ALKPHOS 76 10/03/2020   AST 17 10/03/2020   ALT 17 10/03/2020   PROT 6.9 10/03/2020   ALBUMIN 4.0 10/03/2020   CALCIUM 9.2 10/03/2020   ANIONGAP 8 10/03/2020   GFR 71.18 05/25/2019   Lab Results  Component Value Date   CHOL 290 (H) 07/11/2020   Lab Results  Component Value Date   HDL 54 07/11/2020   Lab Results  Component Value Date   LDLCALC 208 (H) 07/11/2020   Lab Results  Component Value Date   TRIG 150 (H) 07/11/2020   Lab Results  Component Value Date   CHOLHDL 5.4 (H) 07/11/2020   No results found for: HGBA1C     Assessment & Plan:   Problem List Items Addressed This Visit   None Visit Diagnoses     Encounter for Medicare annual wellness exam    -  Primary        No orders of the defined types were placed in this encounter.    Enrigue Catena, RMA   Subjective:   Thomas [name] is a 75 y.o. male who presents for Medicare Annual/Subsequent preventive examination.  Review of Systems    ***       Objective:    There were no vitals filed for this visit. There is no height or weight on file to calculate BMI.  Advanced Directives 06/29/2020 06/22/2020 06/21/2020 12/22/2019 06/23/2019 05/31/2019 06/15/2018  Does Patient Have a Medical Advance Directive? Yes Yes Yes No No No No  Type of Advance Directive - Healthcare Power of [place];Living will [place];Living will - - - -  Does patient want to make changes to medical advance directive? - - No -  Patient declined - - - -  Copy of [place] in Chart? - No - copy requested No - copy requested - - - -  Would patient like information on creating a medical advance directive? - - - No - Patient declined No - Patient declined No - Patient declined No - Patient declined    Current Medications (verified) Outpatient Encounter Medications as of 12/18/2020  Medication Sig   albuterol (VENTOLIN HFA) 108 (90 Base) MCG/ACT inhaler TAKE 2 PUFFS BY MOUTH EVERY 6 HOURS AS NEEDED FOR WHEEZE   Ascorbic Acid (VITAMIN C) 100 MG tablet Take 100 mg by mouth daily.   busPIRone (BUSPAR) 10 MG tablet Take 1 tablet (10 mg total) by mouth 2 (two) times daily.   cetirizine (ZYRTEC) 10 MG tablet Take 10 mg by mouth daily.   co-enzyme Q-10 50 MG capsule Take 50 mg by mouth daily.   diphenhydrAMINE (BENADRYL) 25 MG tablet Take 25 mg by mouth every 6 (six) hours as needed.   doxycycline (VIBRA-TABS) 100 MG tablet Take one tablet po BID for 10 days. Take with snack and tall glass of water   ferrous sulfate 325 (65 FE) MG tablet Take 325 mg by mouth daily with breakfast.   fluticasone furoate-vilanterol (BREO ELLIPTA) 200-25 MCG/INH AEPB 1 puff q am daily   levothyroxine (SYNTHROID) 50 MCG tablet Take 1 1/2 tablets on M W Fri Sat Sun and take 1 on Tues and Thursday   Melatonin 1 MG TABS Take by mouth.   Multiple Vitamins-Minerals (MULTIVITAMIN WITH MINERALS) tablet Take 1 tablet by mouth daily.   niacin 100 MG tablet Take 100 mg by mouth at bedtime.   Omega-3 Fatty Acids (FISH OIL) 1000 MG CAPS Take by mouth.   Red Yeast Rice Extract (RED YEAST RICE PO) Take by mouth.   triamcinolone (KENALOG) 0.1 % Apply 1 application topically 2 (two) times daily.   No facility-administered encounter medications on file as of 12/18/2020.    Allergies (verified) Augmentin [amoxicillin-pot clavulanate], Ciprofloxacin, and Nsaids   History: Past Medical History:  Diagnosis Date   Allergy    trees and plants   Anemia    IDA   Anxiety    Asthma 1998    Diverticulitis    Elevated PSA    GERD (gastroesophageal reflux disease)    Hyperlipidemia    Hypothyroidism 03/16/2019   Prostate cancer [facility])    Past Surgical History:  Procedure Laterality Date   HERNIA REPAIR  1999   prostate cancer     TRANSURETHRAL RESECTION OF PROSTATE     VASECTOMY     Family History  Problem Relation Age of Onset   Heart disease Father    Hypertension Brother    Diabetes Brother    Social History   Socioeconomic History   Marital status: Married    Spouse name: Not on file   Number of children: Not on file   Years of education: Not on file   Highest education level: Not on file  Occupational History   Not on file  Tobacco Use   Smoking status: Former    Types: Cigarettes    Quit date: 04/22/1982    Years since quitting: 38.6   Smokeless tobacco: Never  Vaping Use   Vaping Use: Never used  Substance and Sexual Activity   Alcohol use: Not Currently   Drug use: Not Currently   Sexual activity: Not on file  Other Topics Concern   Not on file  Social History Narrative   Not on file   Social Determinants of Health   Financial Resource Strain: Not on file  Food Insecurity: Not on file  Transportation Needs: Not on file  Physical Activity: Not on file  Stress: Not on file  Social Connections: Not on file    Tobacco Counseling Counseling given: Not Answered   Clinical Intake:                 Diabetic?***         Activities of Daily Living No flowsheet data found.  Patient Care Team: [name], MD as PCP - General (Family Medicine)  Indicate any recent Medical Services you may have received from other than [facility] providers in the past year (date may be approximate).     Assessment:   This is a routine wellness examination for Thomas [name].  Hearing/Vision screen No results found.  Dietary issues and exercise activities discussed:     Goals Addressed   None    Depression Screen PHQ 2/9 Scores 07/19/2020  07/19/2020 02/01/2020 05/21/2017 05/20/2016 11/29/2015 03/29/2015  PHQ - 2 Score '6 6 2 '$ 0 0 0 0  PHQ- 9 Score '20 20 5 '$ - - - -    Fall Risk Fall Risk  08/22/2020 07/19/2020 02/01/2020 05/21/2017 05/20/2016  Falls in the past year? 0 0 0 No No  Number falls in past yr: - 0 0 - -  Injury with Fall? - 0 0 - -  Risk for fall due to : No Fall Risks - - - -  Follow up Falls evaluation completed Falls evaluation completed Falls evaluation completed - -    FALL RISK PREVENTION PERTAINING TO THE HOME:  Any stairs in or around the home? {YES/NO:21197} If so, are there any without handrails? {YES/NO:21197} Home free of loose throw rugs in walkways, pet beds, electrical cords, etc? {YES/NO:21197} Adequate lighting in your home to reduce risk of falls? {YES/NO:21197}  ASSISTIVE DEVICES UTILIZED TO PREVENT FALLS:  Life alert? {YES/NO:21197} Use of a cane, walker or w/c? {YES/NO:21197} Grab bars in the bathroom? {YES/NO:21197} Shower chair or bench in shower? {YES/NO:21197} Elevated toilet seat or a handicapped toilet? {YES/NO:21197}  TIMED UP AND GO:  Was the test performed? {YES/NO:21197}.  Length of time to ambulate 10 feet: *** sec.   {Appearance of YN:9739091  Cognitive Function:        Immunizations Immunization History  Administered Date(s) Administered   Fluad Quad(high Dose 65+) 02/01/2020   PFIZER(Purple Top)SARS-COV-2 Vaccination 05/15/2019, 06/07/2019   Pneumococcal Conjugate-13 04/26/2015   Pneumococcal Polysaccharide-23 03/09/2014    {TDAP status:2101805}  {Flu Vaccine status:2101806}  {Pneumococcal vaccine status:2101807}  {Covid-19 vaccine status:2101808}  Qualifies for Shingles Vaccine? {YES/NO:21197}  Zostavax completed {YES/NO:21197}  {Shingrix Completed?:2101804}  Screening Tests Health Maintenance  Topic Date Due   Hepatitis C Screening  Never done   TETANUS/TDAP  Never done   Zoster Vaccines- Shingrix (1 of 2) Never done   COVID-19 Vaccine (3 - Pfizer  risk series) 07/05/2019   INFLUENZA VACCINE  11/20/2020   PNA vac Low Risk Adult  Completed   HPV VACCINES  Aged Out    Health Maintenance  Health Maintenance Due  Topic Date Due   Hepatitis C Screening  Never done   TETANUS/TDAP  Never done   Zoster Vaccines- Shingrix (1 of 2) Never done   COVID-19 Vaccine (3 - Pfizer risk series) 07/05/2019   INFLUENZA VACCINE  11/20/2020    {Colorectal cancer screening:2101809}  Lung Cancer Screening: (  Low Dose CT Chest recommended if Age 68-80 years, 30 pack-year currently smoking OR have quit w/in 15years.) {DOES NOT does:27190::"does not"} qualify.   Lung Cancer Screening Referral: ***  Additional Screening:  Hepatitis C Screening: {DOES NOT does:27190::"does not"} qualify; Completed ***  Vision Screening: Recommended annual ophthalmology exams for early detection of glaucoma and other disorders of the eye. Is the patient up to date with their annual eye exam?  {YES/NO:21197} Who is the provider or what is the name of the office in which the patient attends annual eye exams? *** If pt is not established with a provider, would they like to be referred to a provider to establish care? {YES/NO:21197}.   Dental Screening: Recommended annual dental exams for proper oral hygiene  Community Resource Referral / Chronic Care Management: CRR required this visit?  {YES/NO:21197}  CCM required this visit?  {YES/NO:21197}     Plan:     I have personally reviewed and noted the following in the patient's chart:   Medical and social history Use of alcohol, tobacco or illicit drugs  Current medications and supplements including opioid prescriptions. {Opioid [phone]} Functional ability and status Nutritional status Physical activity Advanced directives List of other physicians Hospitalizations, surgeries, and ER visits in previous 12 months Vitals Screenings to include cognitive, depression, and falls Referrals and  appointments  In addition, I have reviewed and discussed with patient certain preventive protocols, quality metrics, and best practice recommendations. A written personalized care plan for preventive services as well as general preventive health recommendations were provided to patient.     [place], [place]   12/18/2020   Nurse Notes: ***

## 2020-12-22 ENCOUNTER — Inpatient Hospital Stay: Payer: Medicare HMO

## 2020-12-26 ENCOUNTER — Other Ambulatory Visit: Payer: Self-pay

## 2020-12-26 ENCOUNTER — Ambulatory Visit (INDEPENDENT_AMBULATORY_CARE_PROVIDER_SITE_OTHER): Payer: Medicare HMO | Admitting: Family Medicine

## 2020-12-26 ENCOUNTER — Ambulatory Visit: Payer: Medicare HMO | Admitting: Family Medicine

## 2020-12-26 VITALS — BP 118/70

## 2020-12-26 DIAGNOSIS — G8929 Other chronic pain: Secondary | ICD-10-CM

## 2020-12-26 DIAGNOSIS — E7849 Other hyperlipidemia: Secondary | ICD-10-CM

## 2020-12-26 DIAGNOSIS — M546 Pain in thoracic spine: Secondary | ICD-10-CM | POA: Diagnosis not present

## 2020-12-26 DIAGNOSIS — E038 Other specified hypothyroidism: Secondary | ICD-10-CM | POA: Diagnosis not present

## 2020-12-26 NOTE — Progress Notes (Signed)
   Subjective:    Patient ID: Thomas [name], male    DOB: 1945/05/28, 75 y.o.   MRN: [id number]  HPI  Patient arrives for a follow up on back pain. Patient states the pain comes and goes and  he got a cane to use when it is acting up on him to help with balance.  Patient is being treated for prostate cancer Has an appointment coming up with oncology.  Patient states he also still knows his memory is not as good as it once was.  Patient states for the most part he is able to remember taking his medicines he is driving locally is not getting lost denies any setbacks with that  Patient does not want any type of back surgery. His weight is stable Pain does not wake him up at night  Patient was suffering with depression but he is actually doing better now he is trying his best to work through things from the separation with his wife Review of Systems     Objective:   Physical Exam  Lungs clear heart regular subjective discomfort in the lower thoracic upper lumbar spine negative straight leg raise no edema      Assessment & Plan:   1. Other specified hypothyroidism Check thyroid continue medication - TSH  2. Other hyperlipidemia He takes red rice yeast extract does not want to be on a statin - Lipid panel  3. Chronic midline thoracic back pain History of prostate cancer I offered x-rays patient defers he will watch this Tylenol as needed

## 2020-12-27 ENCOUNTER — Inpatient Hospital Stay: Payer: Medicare HMO | Attending: Oncology

## 2020-12-27 ENCOUNTER — Ambulatory Visit: Payer: Medicare HMO | Admitting: Radiation Oncology

## 2020-12-27 ENCOUNTER — Other Ambulatory Visit: Payer: Self-pay | Admitting: *Deleted

## 2020-12-27 DIAGNOSIS — Z87891 Personal history of nicotine dependence: Secondary | ICD-10-CM | POA: Diagnosis not present

## 2020-12-27 DIAGNOSIS — R69 Illness, unspecified: Secondary | ICD-10-CM | POA: Diagnosis not present

## 2020-12-27 DIAGNOSIS — Z79818 Long term (current) use of other agents affecting estrogen receptors and estrogen levels: Secondary | ICD-10-CM | POA: Insufficient documentation

## 2020-12-27 DIAGNOSIS — Z79899 Other long term (current) drug therapy: Secondary | ICD-10-CM | POA: Insufficient documentation

## 2020-12-27 DIAGNOSIS — C61 Malignant neoplasm of prostate: Secondary | ICD-10-CM | POA: Diagnosis not present

## 2020-12-27 DIAGNOSIS — F419 Anxiety disorder, unspecified: Secondary | ICD-10-CM | POA: Insufficient documentation

## 2020-12-27 DIAGNOSIS — E785 Hyperlipidemia, unspecified: Secondary | ICD-10-CM | POA: Diagnosis not present

## 2020-12-27 DIAGNOSIS — E038 Other specified hypothyroidism: Secondary | ICD-10-CM

## 2020-12-27 DIAGNOSIS — E7849 Other hyperlipidemia: Secondary | ICD-10-CM

## 2020-12-27 DIAGNOSIS — Z191 Hormone sensitive malignancy status: Secondary | ICD-10-CM | POA: Diagnosis not present

## 2020-12-27 DIAGNOSIS — E039 Hypothyroidism, unspecified: Secondary | ICD-10-CM | POA: Diagnosis not present

## 2020-12-27 DIAGNOSIS — R232 Flushing: Secondary | ICD-10-CM | POA: Insufficient documentation

## 2020-12-27 DIAGNOSIS — K219 Gastro-esophageal reflux disease without esophagitis: Secondary | ICD-10-CM | POA: Diagnosis not present

## 2020-12-27 DIAGNOSIS — R5383 Other fatigue: Secondary | ICD-10-CM | POA: Insufficient documentation

## 2020-12-27 DIAGNOSIS — Z7951 Long term (current) use of inhaled steroids: Secondary | ICD-10-CM | POA: Insufficient documentation

## 2020-12-27 LAB — CBC WITH DIFFERENTIAL/PLATELET
Abs Immature Granulocytes: 0.03 10*3/uL (ref 0.00–0.07)
Basophils Absolute: 0.1 10*3/uL (ref 0.0–0.1)
Basophils Relative: 1 %
Eosinophils Absolute: 0.3 10*3/uL (ref 0.0–0.5)
Eosinophils Relative: 5 %
HCT: 42 % (ref 39.0–52.0)
Hemoglobin: 14.6 g/dL (ref 13.0–17.0)
Immature Granulocytes: 1 %
Lymphocytes Relative: 23 %
Lymphs Abs: 1.5 10*3/uL (ref 0.7–4.0)
MCH: 32.8 pg (ref 26.0–34.0)
MCHC: 34.8 g/dL (ref 30.0–36.0)
MCV: 94.4 fL (ref 80.0–100.0)
Monocytes Absolute: 0.6 10*3/uL (ref 0.1–1.0)
Monocytes Relative: 9 %
Neutro Abs: 4 10*3/uL (ref 1.7–7.7)
Neutrophils Relative %: 61 %
Platelets: 188 10*3/uL (ref 150–400)
RBC: 4.45 MIL/uL (ref 4.22–5.81)
RDW: 12.1 % (ref 11.5–15.5)
WBC: 6.4 10*3/uL (ref 4.0–10.5)
nRBC: 0 % (ref 0.0–0.2)

## 2020-12-27 LAB — COMPREHENSIVE METABOLIC PANEL
ALT: 15 U/L (ref 0–44)
AST: 18 U/L (ref 15–41)
Albumin: 4.4 g/dL (ref 3.5–5.0)
Alkaline Phosphatase: 70 U/L (ref 38–126)
Anion gap: 6 (ref 5–15)
BUN: 18 mg/dL (ref 8–23)
CO2: 26 mmol/L (ref 22–32)
Calcium: 9 mg/dL (ref 8.9–10.3)
Chloride: 105 mmol/L (ref 98–111)
Creatinine, Ser: 0.95 mg/dL (ref 0.61–1.24)
GFR, Estimated: >60 mL/min (ref >60)
Glucose, Bld: 105 mg/dL — ABNORMAL HIGH (ref 70–99)
Potassium: 3.8 mmol/L (ref 3.5–5.1)
Sodium: 137 mmol/L (ref 135–145)
Total Bilirubin: 0.4 mg/dL (ref 0.3–1.2)
Total Protein: 7.4 g/dL (ref 6.5–8.1)

## 2020-12-27 LAB — TSH: TSH: 1.841 u[IU]/mL (ref 0.350–4.500)

## 2020-12-27 LAB — PSA: Prostatic Specific Antigen: 0.29 ng/mL (ref 0.00–4.00)

## 2020-12-27 LAB — LIPID PANEL
Cholesterol: 313 mg/dL — ABNORMAL HIGH (ref 0–200)
HDL: 68 mg/dL (ref >40)
LDL Cholesterol: 210 mg/dL — ABNORMAL HIGH (ref 0–99)
Total CHOL/HDL Ratio: 4.6 RATIO
Triglycerides: 175 mg/dL — ABNORMAL HIGH (ref <150)
VLDL: 35 mg/dL (ref 0–40)

## 2021-01-03 ENCOUNTER — Ambulatory Visit: Payer: Medicare HMO | Admitting: Radiation Oncology

## 2021-01-04 ENCOUNTER — Telehealth: Payer: Self-pay | Admitting: *Deleted

## 2021-01-04 NOTE — Chronic Care Management (AMB) (Signed)
  Chronic Care Management   Note  01/04/2021 Name: TRAVARIS KOSH MRN: [id number] DOB: Sep 24, 1945  DICKEY CAAMANO is a 75 y.o. year old male who is a primary care patient of [name], Elayne Snare, MD. I reached out to [name] by phone today in response to a referral sent by Mr. Dunbar Buras Farney's PCP, Dr. [name]       Mr. Humble was given information about Chronic Care Management services today including:  CCM service includes personalized support from designated clinical staff supervised by his physician, including individualized plan of care and coordination with other care providers 24/7 contact phone numbers for assistance for urgent and routine care needs. Service will only be billed when office clinical staff spend 20 minutes or more in a month to coordinate care. Only one practitioner may furnish and bill the service in a calendar month. The patient may stop CCM services at any time (effective at the end of the month) by phone call to the office staff. The patient will be responsible for cost sharing (co-pay) of up to 20% of the service fee (after annual deductible is met).  Patient agreed to services and verbal consent obtained.   Follow up plan: Telephone appointment with care management team member scheduled for:01/12/21  [place] [facility]  Direct Dial: [phone]

## 2021-01-08 ENCOUNTER — Ambulatory Visit
Admission: RE | Admit: 2021-01-08 | Discharge: 2021-01-08 | Disposition: A | Discharge status: Home / Self Care | Payer: Medicare HMO | Source: Ambulatory Visit | Attending: Radiation Oncology | Admitting: Radiation Oncology

## 2021-01-08 ENCOUNTER — Inpatient Hospital Stay (HOSPITAL_BASED_OUTPATIENT_CLINIC_OR_DEPARTMENT_OTHER): Payer: Medicare HMO | Admitting: Oncology

## 2021-01-08 ENCOUNTER — Inpatient Hospital Stay: Payer: Medicare HMO

## 2021-01-08 ENCOUNTER — Encounter: Payer: Self-pay | Admitting: Radiation Oncology

## 2021-01-08 VITALS — BP 134/77 | HR 91 | Temp 98.1°F | Resp 18 | Wt 184.5 lb

## 2021-01-08 VITALS — BP 140/83 | HR 93 | Temp 97.2°F | Wt 184.6 lb

## 2021-01-08 DIAGNOSIS — Z79818 Long term (current) use of other agents affecting estrogen receptors and estrogen levels: Secondary | ICD-10-CM

## 2021-01-08 DIAGNOSIS — C61 Malignant neoplasm of prostate: Secondary | ICD-10-CM

## 2021-01-08 DIAGNOSIS — Z7951 Long term (current) use of inhaled steroids: Secondary | ICD-10-CM | POA: Diagnosis not present

## 2021-01-08 DIAGNOSIS — K219 Gastro-esophageal reflux disease without esophagitis: Secondary | ICD-10-CM | POA: Diagnosis not present

## 2021-01-08 DIAGNOSIS — R5383 Other fatigue: Secondary | ICD-10-CM | POA: Diagnosis not present

## 2021-01-08 DIAGNOSIS — Z5181 Encounter for therapeutic drug level monitoring: Secondary | ICD-10-CM

## 2021-01-08 DIAGNOSIS — Z87891 Personal history of nicotine dependence: Secondary | ICD-10-CM | POA: Diagnosis not present

## 2021-01-08 DIAGNOSIS — Z923 Personal history of irradiation: Secondary | ICD-10-CM | POA: Diagnosis not present

## 2021-01-08 DIAGNOSIS — E7849 Other hyperlipidemia: Secondary | ICD-10-CM

## 2021-01-08 DIAGNOSIS — Z79899 Other long term (current) drug therapy: Secondary | ICD-10-CM | POA: Diagnosis not present

## 2021-01-08 DIAGNOSIS — E785 Hyperlipidemia, unspecified: Secondary | ICD-10-CM | POA: Diagnosis not present

## 2021-01-08 DIAGNOSIS — R232 Flushing: Secondary | ICD-10-CM | POA: Diagnosis not present

## 2021-01-08 DIAGNOSIS — R69 Illness, unspecified: Secondary | ICD-10-CM | POA: Diagnosis not present

## 2021-01-08 DIAGNOSIS — F419 Anxiety disorder, unspecified: Secondary | ICD-10-CM | POA: Diagnosis not present

## 2021-01-08 DIAGNOSIS — Z191 Hormone sensitive malignancy status: Secondary | ICD-10-CM | POA: Diagnosis not present

## 2021-01-08 DIAGNOSIS — E039 Hypothyroidism, unspecified: Secondary | ICD-10-CM | POA: Diagnosis not present

## 2021-01-08 MED ORDER — LEUPROLIDE ACETATE (6 MONTH) 45 MG ~~LOC~~ KIT
45.0000 mg | PACK | Freq: Once | SUBCUTANEOUS | Status: AC
  Administered 2021-01-08: 45 mg via SUBCUTANEOUS
  Filled 2021-01-08: qty 45

## 2021-01-08 NOTE — Progress Notes (Unsigned)
Inj

## 2021-01-08 NOTE — Progress Notes (Signed)
Radiation Oncology Follow up Note  Name: Thomas [name]   Date:   01/08/2021 MRN:  [id number] DOB: 22-Aug-1945    This 75 y.o. male presents to the clinic today for 7 and half year follow-up status post I-125 interstitial implant for Gleason 6 adenocarcinoma presenting with a PSA of 8.7.  REFERRING PROVIDER: [name], MD  HPI: Patient is a 75 year old male now out 7-1/2 years status post I-125 interstitial implant for adenocarcinoma the prostate presenting with a PSA of 8.7.  He was a Gleason 6.  Seen today in routine follow-up he is doing well.  He specifically denies any increased lower urinary tract symptoms diarrhea.  He is currently on ADT therapy for biochemical recurrence.  His most recent PSA is 0.29 down from 0.343 months prior.  He is currently managed by Dr. Janese Banks with his ADT therapy.  COMPLICATIONS OF TREATMENT: none  FOLLOW UP COMPLIANCE: keeps appointments   PHYSICAL EXAM:  BP 140/83   Pulse 93   Temp (!) 97.2 F (36.2 C) (Tympanic)   Wt 184 lb 9.6 oz (83.7 kg)   BMI 32.70 kg/m  Well-developed well-nourished patient in NAD. HEENT reveals PERLA, EOMI, discs not visualized.  Oral cavity is clear. No oral mucosal lesions are identified. Neck is clear without evidence of cervical or supraclavicular adenopathy. Lungs are clear to A&P. Cardiac examination is essentially unremarkable with regular rate and rhythm without murmur rub or thrill. Abdomen is benign with no organomegaly or masses noted. Motor sensory and DTR levels are equal and symmetric in the upper and lower extremities. Cranial nerves II through XII are grossly intact. Proprioception is intact. No peripheral adenopathy or edema is identified. No motor or sensory levels are noted. Crude visual fields are within normal range.  RADIOLOGY RESULTS: No current films to review  PLAN: Present time he is managed by medical oncology with ADT therapy.  He seems to be under good biochemical control at this time.  I will  see him back in 1 year for follow-up.  Be happy to reevaluate the patient in time should palliative radiation therapy be indicated.  I would like to take this opportunity to thank you for allowing me to participate in the care of your patient.Noreene Filbert, MD

## 2021-01-11 ENCOUNTER — Encounter: Payer: Self-pay | Admitting: Oncology

## 2021-01-11 NOTE — Progress Notes (Signed)
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  I do not have the biopsy report from 7 years ago.  He received I-125 interstitial implant for the same.  His presenting PSA at that time was 8.7.  He was last given a dose of Eligard in March 2021 following which his PSA dropped down from 5.28-0.27.  He did not receive any Eligard since then and PSA went back up to 4.7 about 2 weeks ago.  He has been referred to medical oncology for consideration of restarting ADT.   Lupron restarted in March 2022    Interval history-overall tolerating ADT well.  Reports mild self-limited fatigue and hot flashes.  Denies other complaints.  Appetite and weight have remained stable.  Denies any new aches and pains at this time  ECOG PS- 1 Pain scale- 0   Review of systems- Review of Systems  Constitutional:  Positive for malaise/fatigue. Negative for chills, fever and weight loss.  HENT:  Negative for congestion, ear discharge and nosebleeds.   Eyes:  Negative for blurred vision.  Respiratory:  Negative for cough, hemoptysis, sputum production, shortness of breath and wheezing.   Cardiovascular:  Negative for chest pain, palpitations, orthopnea and claudication.  Gastrointestinal:  Negative for abdominal pain, blood in stool, constipation, diarrhea, heartburn, melena, nausea and vomiting.  Genitourinary:  Negative for dysuria, flank pain, frequency, hematuria and  urgency.  Musculoskeletal:  Negative for back pain, joint pain and myalgias.  Skin:  Negative for rash.  Neurological:  Negative for dizziness, tingling, focal weakness, seizures, weakness and headaches.  Endo/Heme/Allergies:  Does not bruise/bleed easily.       Hot flashes  Psychiatric/Behavioral:  Negative for depression and suicidal ideas. The patient does not have insomnia.     Allergies  Allergen Reactions   Augmentin [Amoxicillin-Pot Clavulanate]     Gi upset   Ciprofloxacin    Nsaids Other (See Comments)    Stomach ulcer      Past Medical History:  Diagnosis Date   Allergy    trees and plants   Anemia    IDA   Anxiety    Asthma 1998   Diverticulitis    Elevated PSA    GERD (gastroesophageal reflux disease)    Hyperlipidemia    Hypothyroidism 03/16/2019   Prostate cancer [facility])      Past Surgical History:  Procedure Laterality Date   HERNIA REPAIR  1999   prostate cancer     TRANSURETHRAL RESECTION OF PROSTATE     VASECTOMY      Social History   Socioeconomic History   Marital status: Married    Spouse name: Not on file   Number of children: Not on file   Years of education: Not on file   Highest education level: Not on file  Occupational History   Not on file  Tobacco Use   Smoking status: Former    Types: Cigarettes    Quit date: 04/22/1982    Years since quitting: 38.7   Smokeless  tobacco: Never  Vaping Use   Vaping Use: Never used  Substance and Sexual Activity   Alcohol use: Not Currently    Comment: 1 beer a week   Drug use: Not Currently   Sexual activity: Not on file  Other Topics Concern   Not on file  Social History Narrative   Not on file   Social Determinants of Health   Financial Resource Strain: Not on file  Food Insecurity: Not on file  Transportation Needs: Not on file  Physical Activity: Not on file  Stress: Not on file  Social Connections: Not on file  Intimate Partner Violence: Not on file    Family History   Problem Relation Age of Onset   Heart disease Father    Hypertension Brother    Diabetes Brother      Current Outpatient Medications:    albuterol (VENTOLIN HFA) 108 (90 Base) MCG/ACT inhaler, TAKE 2 PUFFS BY MOUTH EVERY 6 HOURS AS NEEDED FOR WHEEZE, Disp: 18 g, Rfl: 1   Ascorbic Acid (VITAMIN C) 100 MG tablet, Take 100 mg by mouth daily., Disp: , Rfl:    busPIRone (BUSPAR) 10 MG tablet, Take 1 tablet (10 mg total) by mouth 2 (two) times daily., Disp: 60 tablet, Rfl: 5   co-enzyme Q-10 50 MG capsule, Take 50 mg by mouth daily., Disp: , Rfl:    ferrous sulfate 325 (65 FE) MG tablet, Take 325 mg by mouth daily with breakfast., Disp: , Rfl:    FIBER PO, Take 1 tablet by mouth daily., Disp: , Rfl:    fluticasone furoate-vilanterol (BREO ELLIPTA) 200-25 MCG/INH AEPB, 1 puff q am daily, Disp: 1 each, Rfl: 11   levothyroxine (SYNTHROID) 50 MCG tablet, Take 1 1/2 tablets on M W Fri Sat Sun and take 1 on Tues and Thursday, Disp: 120 tablet, Rfl: 1   Melatonin 1 MG TABS, Take by mouth., Disp: , Rfl:    Multiple Vitamins-Minerals (MULTIVITAMIN WITH MINERALS) tablet, Take 1 tablet by mouth daily., Disp: , Rfl:    Omega-3 Fatty Acids (FISH OIL) 1000 MG CAPS, Take by mouth., Disp: , Rfl:    Red Yeast Rice Extract (RED YEAST RICE PO), Take by mouth., Disp: , Rfl:    triamcinolone (KENALOG) 0.1 %, Apply 1 application topically 2 (two) times daily., Disp: 45 g, Rfl: 0   cetirizine (ZYRTEC) 10 MG tablet, Take 10 mg by mouth daily. (Patient not taking: Reported on 01/08/2021), Disp: , Rfl:    diphenhydrAMINE (BENADRYL) 25 MG tablet, Take 25 mg by mouth every 6 (six) hours as needed. (Patient not taking: Reported on 01/08/2021), Disp: , Rfl:    niacin 100 MG tablet, Take 100 mg by mouth at bedtime. (Patient not taking: Reported on 01/08/2021), Disp: , Rfl:   Physical exam:  Vitals:   01/08/21 1336  BP: 134/77  Pulse: 91  Resp: 18  Temp: 98.1 F (36.7 C)  SpO2: 100%  Weight: 184 lb 8 oz (83.7 kg)    Physical Exam Constitutional:      General: He is not in acute distress. Cardiovascular:     Rate and Rhythm: Normal rate and regular rhythm.     Heart sounds: Normal heart sounds.  Pulmonary:     Effort: Pulmonary effort is normal.     Breath sounds: Normal breath sounds.  Abdominal:     General: Bowel sounds are normal.     Palpations: Abdomen is soft.  Skin:    General: Skin is warm and dry.  Neurological:     Mental Status: He is alert and oriented to person, place, and time.     CMP Latest Ref Rng & Units 12/27/2020  Glucose 70 - 99 mg/dL 105(H)  BUN 8 - 23 mg/dL 18  Creatinine 0.61 - 1.24 mg/dL 0.95  Sodium 135 - 145 mmol/L 137  Potassium 3.5 - 5.1 mmol/L 3.8  Chloride 98 - 111 mmol/L 105  CO2 22 - 32 mmol/L 26  Calcium 8.9 - 10.3 mg/dL 9.0  Total Protein 6.5 - 8.1 g/dL 7.4  Total Bilirubin 0.3 - 1.2 mg/dL 0.4  Alkaline Phos 38 - 126 U/L 70  AST 15 - 41 U/L 18  ALT 0 - 44 U/L 15   CBC Latest Ref Rng & Units 12/27/2020  WBC 4.0 - 10.5 K/uL 6.4  Hemoglobin 13.0 - 17.0 g/dL 14.6  Hematocrit 39.0 - 52.0 % 42.0  Platelets 150 - 400 K/uL 188    Assessment and plan- Patient is a 75 y.o. male with castrate sensitive prostate cancer currently on ADT here for routine follow-up  Presently patient's CBC and CMP are within normal limits.  PSA continues to trend down to 0.29 from a prior value of 0.34.  It was 4.717 months ago.Patient continues to respond well to ADT so far.  He will therefore proceed with his next dose of Eligard today and I will see him back in 6 months with CBC with differential CMP and PSA for Eligard   Visit Diagnosis 1. Encounter for monitoring Lupron therapy   2. Prostate cancer [facility])      Dr. Randa Evens, MD, MPH [facility] [phone] 01/11/2021 8:44 AM

## 2021-01-12 ENCOUNTER — Ambulatory Visit (INDEPENDENT_AMBULATORY_CARE_PROVIDER_SITE_OTHER): Payer: Medicare HMO | Admitting: Pharmacist

## 2021-01-12 ENCOUNTER — Telehealth: Payer: Self-pay | Admitting: Family Medicine

## 2021-01-12 DIAGNOSIS — F321 Major depressive disorder, single episode, moderate: Secondary | ICD-10-CM

## 2021-01-12 DIAGNOSIS — E782 Mixed hyperlipidemia: Secondary | ICD-10-CM

## 2021-01-12 DIAGNOSIS — C61 Malignant neoplasm of prostate: Secondary | ICD-10-CM

## 2021-01-12 DIAGNOSIS — Z79899 Other long term (current) drug therapy: Secondary | ICD-10-CM

## 2021-01-12 MED ORDER — ROSUVASTATIN CALCIUM 20 MG PO TABS: 20.0000 mg | ORAL_TABLET | Freq: Every day | ORAL | 3 refills | Status: DC

## 2021-01-12 NOTE — Chronic Care Management (AMB) (Signed)
Chronic Care Management Pharmacy Note  01/12/2021 Name:  Thomas [name] MRN:  [id number] DOB:  10-27-1945  Summary:  LDL well above goal. After discussion with PCP, will add rosuvastatin 20 mg by mouth once daily Since patient no longer taking red yeast rice, CoQ10, and niacin, recommended that he discontinue since we are adding statin as above Check lipid panel and hepatic function in ~8 weeks  Follow-up with PCP in ~8 weeks Discussed that using Benadryl is not ideal for him given anticholinergic properties. Recommended that he discontinue and use Allegra for allergies instead.   Subjective: Thomas [name] is an 75 y.o. year old male who is a primary patient of [name], Elayne Snare, MD.  The CCM team was consulted for assistance with disease management and care coordination needs.    Engaged with patient by telephone for initial visit in response to provider referral for pharmacy case management and/or care coordination services.   Consent to Services:  The patient was given the following information about Chronic Care Management services today, agreed to services, and gave verbal consent: 1. CCM service includes personalized support from designated clinical staff supervised by the primary care provider, including individualized plan of care and coordination with other care providers 2. 24/7 contact phone numbers for assistance for urgent and routine care needs. 3. Service will only be billed when office clinical staff spend 20 minutes or more in a month to coordinate care. 4. Only one practitioner may furnish and bill the service in a calendar month. 5.The patient may stop CCM services at any time (effective at the end of the month) by phone call to the office staff. 6. The patient will be responsible for cost sharing (co-pay) of up to 20% of the service fee (after annual deductible is met). Patient agreed to services and consent obtained.  Patient Care Team: [name], MD as PCP -  General (Family Medicine) Beryle Lathe, [facility] (Pharmacist)  Objective:  Lab Results  Component Value Date   CREATININE 0.95 12/27/2020   CREATININE 1.05 10/03/2020   CREATININE 1.07 12/03/2019    No results found for: HGBA1C Last diabetic Eye exam: No results found for: HMDIABEYEEXA  Last diabetic Foot exam: No results found for: HMDIABFOOTEX      Component Value Date/Time   CHOL 313 (H) 12/27/2020 0839   CHOL 290 (H) 07/11/2020 0814   TRIG 175 (H) 12/27/2020 0839   HDL 68 12/27/2020 0839   HDL 54 07/11/2020 0814   CHOLHDL 4.6 12/27/2020 0839   VLDL 35 12/27/2020 0839   LDLCALC 210 (H) 12/27/2020 0839   LDLCALC 208 (H) 07/11/2020 0814    Hepatic Function Latest Ref Rng & Units 12/27/2020 10/03/2020 03/15/2019  Total Protein 6.5 - 8.1 g/dL 7.4 6.9 6.5  Albumin 3.5 - 5.0 g/dL 4.4 4.0 4.4  AST 15 - 41 U/L _0 ALT 0 - 44 U/L _1 Alk Phosphatase 38 - 126 U/L 70 76 85  Total Bilirubin 0.3 - 1.2 mg/dL 0.4 0.6 0.3  Bilirubin, Direct 0.00 - 0.40 mg/dL - - 0.10    Lab Results  Component Value Date/Time   TSH 1.841 12/27/2020 08:39 AM   TSH 4.750 (H) 07/11/2020 08:14 AM   TSH 4.780 (H) 12/03/2019 08:02 AM   FREET4 1.01 07/11/2020 08:14 AM   FREET4 1.08 08/12/2019 09:47 AM    CBC Latest Ref Rng & Units 12/27/2020 10/03/2020 11/15/2019  WBC 4.0 - 10.5 K/uL 6.4 6.5  6.9  Hemoglobin 13.0 - 17.0 g/dL 14.6 14.3 13.7  Hematocrit 39.0 - 52.0 % 42.0 41.8 39.7  Platelets 150 - 400 K/uL 188 215 207.0    No results found for: VD25OH  Clinical ASCVD: No  The 10-year ASCVD risk score (Arnett DK, et al., 2019) is: 29.1%   Values used to calculate the score:     Age: 55 years     Sex: Male     Is Non-Hispanic African American: No     Diabetic: No     Tobacco smoker: No     Systolic Blood Pressure: 282 mmHg     Is BP treated: No     HDL Cholesterol: 68 mg/dL     Total Cholesterol: 313 mg/dL    Social History   Tobacco Use  Smoking Status Former   Types:  Cigarettes   Quit date: 04/22/1982   Years since quitting: 38.7  Smokeless Tobacco Never   BP Readings from Last 3 Encounters:  01/08/21 140/83  01/08/21 134/77  12/26/20 118/70   Pulse Readings from Last 3 Encounters:  01/08/21 93  01/08/21 91  10/03/20 89   Wt Readings from Last 3 Encounters:  01/08/21 184 lb 9.6 oz (83.7 kg)  01/08/21 184 lb 8 oz (83.7 kg)  10/03/20 185 lb 11.2 oz (84.2 kg)    Assessment: Review of patient past medical history, allergies, medications, health status, including review of consultants reports, laboratory and other test data, was performed as part of comprehensive evaluation and provision of chronic care management services.   SDOH:  (Social Determinants of Health) assessments and interventions performed:    CCM Care Plan  Allergies  Allergen Reactions   Augmentin [Amoxicillin-Pot Clavulanate]     Gi upset   Ciprofloxacin    Nsaids Other (See Comments)    Stomach ulcer     Medications Reviewed Today     Reviewed by Sindy Guadeloupe, MD (Physician) on 01/11/21 at [phone]  Med List Status: <None>   Medication Order Taking? Sig Documenting Provider Last Dose Status Informant  albuterol (VENTOLIN HFA) 108 (90 Base) MCG/ACT inhaler [id number] Yes TAKE 2 PUFFS BY MOUTH EVERY 6 HOURS AS NEEDED FOR WHEEZE [name], Elayne Snare, MD Taking Active   Ascorbic Acid (VITAMIN C) 100 MG tablet [id number] Yes Take 100 mg by mouth daily. [provider] Taking Active   busPIRone (BUSPAR) 10 MG tablet [id number] Yes Take 1 tablet (10 mg total) by mouth 2 (two) times daily. [name], MD Taking Active   cetirizine (ZYRTEC) 10 MG tablet [id number] No Take 10 mg by mouth daily.  Patient not taking: Reported on 01/08/2021   [provider] Not Taking Active            Med Note [facility], Isidoro Donning May 05, 2014 11:57 AM) PRN  co-enzyme Q-10 50 MG capsule [id number] Yes Take 50 mg by mouth daily. [provider] Taking Active    diphenhydrAMINE (BENADRYL) 25 MG tablet [id number] No Take 25 mg by mouth every 6 (six) hours as needed.  Patient not taking: Reported on 01/08/2021   [provider] Not Taking Active   ferrous sulfate 325 (65 FE) MG tablet [id number] Yes Take 325 mg by mouth daily with breakfast. [provider] Taking Active   FIBER PO [id number] Yes Take 1 tablet by mouth daily. [provider] Taking Active   fluticasone furoate-vilanterol (BREO ELLIPTA) 200-25 MCG/INH AEPB [id number] Yes 1 puff q am daily [name],  Elayne Snare, MD Taking Active   levothyroxine (SYNTHROID) 50 MCG tablet [id number] Yes Take 1 1/2 tablets on M W Fri Sat Sun and take 1 on Tues and Thursday [name], MD Taking Active   Melatonin 1 MG TABS [id number] Yes Take by mouth. [provider] Taking Active   Multiple Vitamins-Minerals (MULTIVITAMIN WITH MINERALS) tablet [id number] Yes Take 1 tablet by mouth daily. [provider] Taking Active   niacin 100 MG tablet [id number] No Take 100 mg by mouth at bedtime.  Patient not taking: Reported on 01/08/2021   [provider] Not Taking Active   Omega-3 Fatty Acids (FISH OIL) 1000 MG CAPS [id number] Yes Take by mouth. [provider] Taking Active   Red Yeast Rice Extract (RED YEAST RICE PO) [id number] Yes Take by mouth. [provider] Taking Active   triamcinolone (KENALOG) 0.1 % [id number] Yes Apply 1 application topically 2 (two) times daily. [name], MD Taking Active   Med List Note Carmelina Noun, LPN 94/50/38 8828): -            Patient Active Problem List   Diagnosis Date Noted   Chronic bilateral low back pain without sciatica 08/22/2020   Malignant neoplasm of prostate ([place]) 06/30/2020   Hypothyroidism 03/16/2019   GERD (gastroesophageal reflux disease) 11/29/2015   Insomnia 11/29/2015   Prostate cancer ([place]) 12/30/2014   Hyperlipidemia 12/30/2014   Anemia 07/19/2013   Iron deficiency 07/19/2013    LUQ abdominal pain 07/19/2013   Rectal bleeding 07/19/2013    Immunization History  Administered Date(s) Administered   Fluad Quad(high Dose 65+) 02/01/2020   PFIZER(Purple Top)SARS-COV-2 Vaccination 05/15/2019, 06/07/2019   Pneumococcal Conjugate-13 04/26/2015   Pneumococcal Polysaccharide-23 03/09/2014    Conditions to be addressed/monitored: HLD, Anxiety, Depression, Pulmonary Disease, and prostate cancer  Care Plan : Medication Management  Updates made by Beryle Lathe, [place] since 01/12/2021 12:00 AM     Problem: Prostate Cancer, Hyperlipidemia, Shortness of Breath, and Anxiety/Depression   Priority: High  Onset Date: 01/12/2021     Long-Range Goal: Disease Progression Prevention   Start Date: 01/12/2021  Expected End Date: 04/12/2021  This Visit's Progress: On track  Priority: High  Note:   Current Barriers:  Unable to independently monitor therapeutic efficacy Unable to achieve control of hyperlipidemia and shortness of breath Suboptimal therapeutic regimen for hyperlipidemia  Pharmacist Clinical Goal(s):  Over the next 90 days, patient will Achieve adherence to monitoring guidelines and medication adherence to achieve therapeutic efficacy Achieve control of hyperlipidemia and shortness of breath as evidenced by improved LDL, improved triglycerides, and improved shortness of breath Adhere to plan to optimize therapeutic regimen for hyperlipidemia as evidenced by report of adherence to recommended medication management changes through collaboration with PharmD and provider.   Interventions: 1:1 collaboration with [name], MD regarding development and update of comprehensive plan of care as evidenced by provider attestation and co-signature Inter-disciplinary care team collaboration (see longitudinal plan of care) Comprehensive medication review performed; medication list updated in electronic medical record  Hyperlipidemia: Uncontrolled. LDL above  goal of <70 due to very high risk given 10-year risk >20% per 2020 AACE/ACE guidelines. Triglycerides above goal of <150 per 2020 AACE/ACE guidelines. Current medications:  none, patient was taking red yeast rice, fish oil, niacin, and CoQ10 but reports that he recently stopped them (unsure why) Intolerances: none Taking medications as directed: n/a Side effects thought to be attributed to current medication regimen: n/a After discussion with PCP, will add  rosuvastatin 20 mg by mouth once daily Since patient no longer taking red yeast rice, CoQ10, and niacin, recommended that he discontinue since we are adding statin as above If LDL remains elevated after addition of high intensity statin, will recommend addition of ezetimibe 10 mg by mouth daily Encourage dietary reduction of high fat containing foods such as butter, nuts, bacon, egg yolks, etc. Recommend regular aerobic exercise Reviewed risks of hyperlipidemia, principles of treatment and consequences of untreated hyperlipidemia Check lipid panel and hepatic function in ~8 weeks  Follow-up with PCP in ~8 weeks  Shortness of breath (unclear if formally diagnosed with asthma or chronic obstructive pulmonary disease): Uncontrolled due to non-compliance Patient reports history of smoking best quit in 1984 Current treatment: albuterol metered dose (ProAir, Ventolin, Proventil) 1 puff by mouth twice daily and fluticasone furoate/vilanterol (Breo Ellipta) 200-25 mcg 1 puff by mouth once daily Patient reports using Allegra daily for allergies and Benadryl as needed for sleep/allergies. Discussed that using Benadryl is not ideal for him given anticholinergic properties. Recommended that he discontinue Patient reports that he is only using Breo every other day due to high cost. Reviewed with him the importance of daily use of Breo and limiting use of albuterol to only when he has acute shortness of breath. Discussed patient assistance program with the  patient which he does not qualify for at this time. He falls within the income limits according to his income and their federal poverty level limits; however, since he has Medicare, he would only be approved through the [place] patient assistance program if he has spent at least $600 at the pharmacy this year which he has not. Discussed meeting with a [place] counselor to make sure he is on the best Medicare plan for his medication coverage. Details in AVS.  Reminded patient to rinse mouth with water and spit after using ICS containing inhaler to prevent thrush Continue current inhaler regimen  Prostate cancer (Followed by Dr. Janese Banks) Controlled. PSA levels responding well Current medications: Eligard 45 mg subcutaneously every 6 months  Anxiety/Depression Controlled per patient Current medications: buspirone 10 mg by mouth twice daily Offered support from [place]; however, patient declines today  Patient Goals/Self-Care Activities Over the next 90 days, patient will:  Take medications as prescribed Engage in dietary modifications by decreased fat intake  Follow Up Plan: Telephone follow up appointment with care management team member scheduled for: 02/02/21      Medication Assistance: None required.  Patient affirms current coverage meets needs.  Patient's preferred pharmacy is:  [facility] [address], [place] - [place] [address] [place] [address] [place] [address] Phone: [phone] Fax: [phone]  Follow Up:  Patient agrees to Care Plan and Follow-up.  Plan: Telephone follow up appointment with care management team member scheduled for:  02/02/21  [name], PharmD Clinical Pharmacist [place] [phone]

## 2021-01-12 NOTE — Telephone Encounter (Signed)
Patient stated Dr. Nicki Reaper wanted to see him next week for high cholesterol follow up. Had CCM Call at 9:00 today. Dr. Nicki Reaper has several "same day" slots. Please advise.  CB#  [phone]

## 2021-01-12 NOTE — Patient Instructions (Addendum)
Thomas [name],  It was great to talk to you today!  Please start taking rosuvastatin (Crestor) 20 mg by mouth daily to lower your LDL (bad cholesterol) and lower your risk of having a heart attack or stroke. We will check your cholesterol panel again a couple months.   Please call me with any questions or concerns.   It can be hard choosing a Medicare plan. A group that I always recommend for my patients is called [facility] [place]). With this program, they offer free counseling to Medicare beneficiaries and caregivers about Medicare, Medicare supplements, Medicare Advantage, Medicare Part D, and long-term care insurance. [place] counselors are not [employer], and they do not sell or endorse any product, plan, or company. The counselors are volunteers and they always offer unbiased information regarding Medicare health care products.  [facility] has counselors in every county across the state who are trained to be the [place] people for seniors and Medicare beneficiaries in their local communities. Local counseling is done by appointment in each county.   The [facility] local counseling team's information can be found below. I recommend that you give them a call and set up an appointment (Ask for [place]).    [facility]     [address] [place]  [address] [phone]   For more information: SatelliteSeeker.no or just google "[place] [facility]"   Visit Information   PATIENT GOALS:   Goals Addressed             This Visit's Progress    Medication Management       Patient Goals/Self-Care Activities Over the next 90 days, patient will:  Take medications as prescribed Engage in dietary modifications by decreased fat intake         Consent to CCM Services: Thomas [name] was given information about Chronic Care Management services including:  CCM service includes personalized support from designated clinical staff supervised by his physician, including individualized plan of care and coordination with other care providers 24/7 contact phone numbers for assistance for urgent and routine care needs. Service will only be billed when office clinical staff spend 20 minutes or more in a month to coordinate care. Only one practitioner may furnish and bill the service in a calendar month. The patient may stop CCM services at any time (effective at the end of the month) by phone call to the office staff. The patient will be responsible for cost sharing (co-pay) of up to 20% of the service fee (after annual deductible is met).  Patient agreed to services and verbal consent obtained.   Patient verbalizes understanding of instructions provided today and agrees to view in [place].   Telephone follow up appointment with care management team member scheduled for:02/02/21  [name], PharmD Clinical Pharmacist [name] [phone]  CLINICAL CARE PLAN: Patient Care Plan: Medication Management     Problem Identified: Prostate Cancer, Hyperlipidemia, Shortness of Breath, and Anxiety/Depression   Priority: High  Onset Date: 01/12/2021     Long-Range Goal: Disease Progression Prevention   Start Date: 01/12/2021  Expected End Date: 04/12/2021  This Visit's Progress: On track  Priority: High  Note:   Current Barriers:  Unable to independently monitor therapeutic efficacy Unable to achieve control of hyperlipidemia and shortness of breath Suboptimal therapeutic regimen for hyperlipidemia  Pharmacist Clinical Goal(s):  Over the next 90 days, patient will Achieve adherence to monitoring guidelines and medication adherence to  achieve therapeutic efficacy Achieve control of hyperlipidemia and  shortness of breath as evidenced by improved LDL, improved triglycerides, and improved shortness of breath Adhere to plan to optimize therapeutic regimen for hyperlipidemia as evidenced by report of adherence to recommended medication management changes through collaboration with PharmD and provider.   Interventions: 1:1 collaboration with Thomas Drown, MD regarding development and update of comprehensive plan of care as evidenced by provider attestation and co-signature Inter-disciplinary care team collaboration (see longitudinal plan of care) Comprehensive medication review performed; medication list updated in electronic medical record  Hyperlipidemia: Uncontrolled. LDL above goal of <70 due to very high risk given 10-year risk >20% per 2020 AACE/ACE guidelines. Triglycerides above goal of <150 per 2020 AACE/ACE guidelines. Current medications:  none, patient was taking red yeast rice, fish oil, niacin, and CoQ10 but reports that he recently stopped them (unsure why) Intolerances: none Taking medications as directed: n/a Side effects thought to be attributed to current medication regimen: n/a After discussion with PCP, will add rosuvastatin 20 mg by mouth once daily Since patient no longer taking red yeast rice, CoQ10, and niacin, recommended that he discontinue since we are adding statin as above If LDL remains elevated after addition of high intensity statin, will recommend addition of ezetimibe 10 mg by mouth daily Encourage dietary reduction of high fat containing foods such as butter, nuts, bacon, egg yolks, etc. Recommend regular aerobic exercise Reviewed risks of hyperlipidemia, principles of treatment and consequences of untreated hyperlipidemia Check lipid panel and hepatic function in ~8 weeks  Follow-up with PCP in ~8 weeks  Shortness of breath (unclear if formally diagnosed with asthma or chronic obstructive pulmonary disease): Uncontrolled due to non-compliance Patient  reports history of smoking best quit in 1984 Current treatment: albuterol metered dose (ProAir, Ventolin, Proventil) 1 puff by mouth twice daily and fluticasone furoate/vilanterol (Breo Ellipta) 200-25 mcg 1 puff by mouth once daily Patient reports using Allegra daily for allergies and Benadryl as needed for sleep/allergies. Discussed that using Benadryl is not ideal for him given anticholinergic properties. Recommended that he discontinue Patient reports that he is only using Breo every other day due to high cost. Reviewed with him the importance of daily use of Breo and limiting use of albuterol to only when he has acute shortness of breath. Discussed patient assistance program with the patient which he does not qualify for at this time. He falls within the income limits according to his income and their federal poverty level limits; however, since he has Medicare, he would only be approved through the [place] patient assistance program if he has spent at least $600 at the pharmacy this year which he has not. Discussed meeting with a [place] counselor to make sure he is on the best Medicare plan for his medication coverage. Details in AVS.  Reminded patient to rinse mouth with water and spit after using ICS containing inhaler to prevent thrush Continue current inhaler regimen  Prostate cancer (Followed by Dr. Janese Banks) Controlled. PSA levels responding well Current medications: Eligard 45 mg subcutaneously every 6 months  Anxiety/Depression Controlled per patient Current medications: buspirone 10 mg by mouth twice daily Offered support from [place]; however, patient declines today  Patient Goals/Self-Care Activities Over the next 90 days, patient will:  Take medications as prescribed Engage in dietary modifications by decreased fat intake  Follow Up Plan: Telephone follow up appointment with care management team member scheduled for: 02/02/21

## 2021-01-12 NOTE — Telephone Encounter (Signed)
Please advise. Thank you

## 2021-01-13 NOTE — Telephone Encounter (Signed)
I had spoken with Thomas [name] It is important for him to do a lipid liver profile in 8 weeks Follow-up visit after those lab work. Does not need to do a blood work or office visit right at this moment (If patient has issues that he would like to do an office visit for this can be done within the next 2 weeks thank you)

## 2021-01-15 NOTE — Telephone Encounter (Signed)
Pt contacted and verbalized understanding. Lab orders placed and mailed to patient with reminder to have done in 8 weeks then call for follow up

## 2021-01-19 DIAGNOSIS — R69 Illness, unspecified: Secondary | ICD-10-CM | POA: Diagnosis not present

## 2021-01-19 DIAGNOSIS — E782 Mixed hyperlipidemia: Secondary | ICD-10-CM

## 2021-01-19 DIAGNOSIS — C61 Malignant neoplasm of prostate: Secondary | ICD-10-CM

## 2021-01-19 DIAGNOSIS — F321 Major depressive disorder, single episode, moderate: Secondary | ICD-10-CM | POA: Diagnosis not present

## 2021-02-02 ENCOUNTER — Ambulatory Visit (INDEPENDENT_AMBULATORY_CARE_PROVIDER_SITE_OTHER): Payer: Medicare HMO | Admitting: Pharmacist

## 2021-02-02 DIAGNOSIS — F321 Major depressive disorder, single episode, moderate: Secondary | ICD-10-CM

## 2021-02-02 DIAGNOSIS — C61 Malignant neoplasm of prostate: Secondary | ICD-10-CM

## 2021-02-02 DIAGNOSIS — E782 Mixed hyperlipidemia: Secondary | ICD-10-CM

## 2021-02-02 NOTE — Chronic Care Management (AMB) (Signed)
Chronic Care Management Pharmacy Note  02/02/2021 Name:  Thomas [name] MRN:  [id number] DOB:  [birth year]  Summary: Hyperlipidemia: Patient reports he has improved his diet by reducing fatty foods and is eating more fruits and vegetables. Efforts praised. Patient has restarted taking red yeast rice, fish oil, and CoQ10. Patient picked up rosuvastatin prescription from pharmacy but did not start taking it since he read about potential side effects. Dicussed risks and benefits with patient. Discussed that just because something is "natural" does not mean it is without risk of side effects. Encouraged that he start rosuvastatin 20 mg by mouth once daily instead of current regimen. Patient verbalized understanding and said he would think about it. Recommended that the patient come in for lipid panel in December to check lipids again. If LDL remains above goal, will continue to recommend statin therapy. If patient apprehensive of statin therapy, could also consider addition of ezetimibe 10 mg by mouth daily  Shortness of breath Patient reports improved inhaler compliance  Subjective: Thomas [name] is an 75 y.o. year old male who is a primary patient of [name], Elayne Snare, MD.  The CCM team was consulted for assistance with disease management and care coordination needs.    Engaged with patient by telephone for follow up visit in response to provider referral for pharmacy case management and/or care coordination services.   Consent to Services:  The patient was given information about Chronic Care Management services, agreed to services, and gave verbal consent prior to initiation of services.  Please see initial visit note for detailed documentation.   Patient Care Team: [name], MD as PCP - General (Family Medicine) Beryle Lathe, [facility] (Pharmacist)  Objective:  Lab Results  Component Value Date   CREATININE 0.95 12/27/2020   CREATININE 1.05 10/03/2020    CREATININE 1.07 12/03/2019    No results found for: HGBA1C Last diabetic Eye exam: No results found for: HMDIABEYEEXA  Last diabetic Foot exam: No results found for: HMDIABFOOTEX      Component Value Date/Time   CHOL 313 (H) 12/27/2020 0839   CHOL 290 (H) 07/11/2020 0814   TRIG 175 (H) 12/27/2020 0839   HDL 68 12/27/2020 0839   HDL 54 07/11/2020 0814   CHOLHDL 4.6 12/27/2020 0839   VLDL 35 12/27/2020 0839   LDLCALC 210 (H) 12/27/2020 0839   LDLCALC 208 (H) 07/11/2020 0814    Hepatic Function Latest Ref Rng & Units 12/27/2020 10/03/2020 03/15/2019  Total Protein 6.5 - 8.1 g/dL 7.4 6.9 6.5  Albumin 3.5 - 5.0 g/dL 4.4 4.0 4.4  AST 15 - 41 U/L '18 17 15  ' ALT 0 - 44 U/L '15 17 15  ' Alk Phosphatase 38 - 126 U/L 70 76 85  Total Bilirubin 0.3 - 1.2 mg/dL 0.4 0.6 0.3  Bilirubin, Direct 0.00 - 0.40 mg/dL - - 0.10    Lab Results  Component Value Date/Time   TSH 1.841 12/27/2020 08:39 AM   TSH 4.750 (H) 07/11/2020 08:14 AM   TSH 4.780 (H) 12/03/2019 08:02 AM   FREET4 1.01 07/11/2020 08:14 AM   FREET4 1.08 08/12/2019 09:47 AM    CBC Latest Ref Rng & Units 12/27/2020 10/03/2020 11/15/2019  WBC 4.0 - 10.5 K/uL 6.4 6.5 6.9  Hemoglobin 13.0 - 17.0 g/dL 14.6 14.3 13.7  Hematocrit 39.0 - 52.0 % 42.0 41.8 39.7  Platelets 150 - 400 K/uL 188 215 207.0    No results found for: VD25OH  Clinical ASCVD: No  The  10-year ASCVD risk score (Arnett DK, et al., 2019) is: 29.1%   Values used to calculate the score:     Age: 72 years     Sex: Male     Is Non-Hispanic African American: No     Diabetic: No     Tobacco smoker: No     Systolic Blood Pressure: 093 mmHg     Is BP treated: No     HDL Cholesterol: 68 mg/dL     Total Cholesterol: 313 mg/dL    Social History   Tobacco Use  Smoking Status Former   Types: Cigarettes   Quit date: 04/22/1982   Years since quitting: 38.8  Smokeless Tobacco Never   BP Readings from Last 3 Encounters:  01/08/21 140/83  01/08/21 134/77  12/26/20 118/70    Pulse Readings from Last 3 Encounters:  01/08/21 93  01/08/21 91  10/03/20 89   Wt Readings from Last 3 Encounters:  01/08/21 184 lb 9.6 oz (83.7 kg)  01/08/21 184 lb 8 oz (83.7 kg)  10/03/20 185 lb 11.2 oz (84.2 kg)    Assessment: Review of patient past medical history, allergies, medications, health status, including review of consultants reports, laboratory and other test data, was performed as part of comprehensive evaluation and provision of chronic care management services.   SDOH:  (Social Determinants of Health) assessments and interventions performed:    CCM Care Plan  Allergies  Allergen Reactions   Augmentin [Amoxicillin-Pot Clavulanate]     Gi upset   Ciprofloxacin    Nsaids Other (See Comments)    Stomach ulcer     Medications Reviewed Today     Reviewed by Beryle Lathe, [facility] (Pharmacist) on 02/02/21 at [place] List Status: <None>   Medication Order Taking? Sig Documenting Provider Last Dose Status Informant  acetaminophen (TYLENOL) 500 MG tablet [id number] Yes Take 500 mg by mouth every 6 (six) hours as needed. [provider] Taking Active Self  albuterol (VENTOLIN HFA) 108 (90 Base) MCG/ACT inhaler [id number] Yes TAKE 2 PUFFS BY MOUTH EVERY 6 HOURS AS NEEDED FOR WHEEZE [name], Elayne Snare, MD Taking Active            Med Note Waldo Laine, [place]wen[place] Allegra   Fri Jan 12, 2021  9:12 AM) Uses 2 puffs daily  Ascorbic Acid (VITAMIN C) 100 MG tablet [id number] No Take 100 mg by mouth daily.  Patient not taking: No sig reported   [provider] Not Taking Active   busPIRone (BUSPAR) 10 MG tablet [id number] Yes Take 1 tablet (10 mg total) by mouth 2 (two) times daily. [name], MD Taking Active   co-enzyme Q-10 30 MG capsule [id number] Yes Take 30 mg by mouth daily. [provider] Taking Active Self  ferrous sulfate 325 (65 FE) MG tablet [id number] Yes Take 325 mg by mouth daily with breakfast. [provider] Taking Active    fexofenadine (ALLEGRA) 180 MG tablet [id number] Yes Take 180 mg by mouth daily. [provider] Taking Active Self  FIBER PO [id number] Yes Take 1 tablet by mouth daily. [provider] Taking Active   fluticasone furoate-vilanterol (BREO ELLIPTA) 200-25 MCG/INH AEPB [id number] Yes 1 puff q am daily [name], MD Taking Active            Med Note Beryle Lathe   Fri Feb 02, 2021  4:15 PM)    levothyroxine (SYNTHROID) 50 MCG tablet [id number] Yes Take 1 1/2 tablets on M W Fri  Sat Sun and take 1 on Tues and Thursday [name], MD Taking Active   Melatonin 1 MG TABS [id number] Yes Take 5 mg by mouth at bedtime as needed. [provider] Taking Active   Multiple Vitamins-Minerals (MULTIVITAMIN WITH MINERALS) tablet [id number] Yes Take 1 tablet by mouth daily. [provider] Taking Active   nitroGLYCERIN (NITROSTAT) 0.4 MG SL tablet [id number] No Place 0.4 mg under the tongue every 5 (five) minutes as needed for chest pain.  Patient not taking: No sig reported   [provider] Not Taking Active   Omega-3 Fatty Acids (FISH OIL) 1000 MG CAPS [id number] Yes Take by mouth. [provider] Taking Active   Red Yeast Rice Extract 300 MG CAPS [id number] Yes Take 1 tablet by mouth daily. [provider] Taking Active Self  rosuvastatin (CRESTOR) 20 MG tablet [id number] No Take 1 tablet (20 mg total) by mouth daily.  Patient not taking: Reported on 02/02/2021   [name], MD Not Taking Active   triamcinolone (KENALOG) 0.1 % [id number] Yes Apply 1 application topically 2 (two) times daily. [name], MD Taking Active   Med List Note Carmelina Noun, LPN 75/64/33 2951): -            Patient Active Problem List   Diagnosis Date Noted   Chronic bilateral low back pain without sciatica 08/22/2020   Malignant neoplasm of prostate ([place]) 06/30/2020   Hypothyroidism 03/16/2019   GERD (gastroesophageal reflux disease)  11/29/2015   Insomnia 11/29/2015   Prostate cancer ([place]) 12/30/2014   Hyperlipidemia 12/30/2014   Anemia 07/19/2013   Iron deficiency 07/19/2013   LUQ abdominal pain 07/19/2013   Rectal bleeding 07/19/2013    Immunization History  Administered Date(s) Administered   Fluad Quad(high Dose 65+) 02/01/2020   PFIZER(Purple Top)SARS-COV-2 Vaccination 05/15/2019, 06/07/2019   Pneumococcal Conjugate-13 04/26/2015   Pneumococcal Polysaccharide-23 03/09/2014    Conditions to be addressed/monitored: HLD, Anxiety, Depression, prostate cancer, and shortness of breath  Care Plan : Medication Management  Updates made by Beryle Lathe, [place] since 02/02/2021 12:00 AM     Problem: Prostate Cancer, Hyperlipidemia, Shortness of Breath, and Anxiety/Depression   Priority: High  Onset Date: 01/12/2021     Long-Range Goal: Disease Progression Prevention   Start Date: 01/12/2021  Expected End Date: 04/12/2021  Recent Progress: On track  Priority: High  Note:   Current Barriers:  Unable to independently monitor therapeutic efficacy Unable to achieve control of hyperlipidemia and shortness of breath Suboptimal therapeutic regimen for hyperlipidemia  Pharmacist Clinical Goal(s):  Over the next 90 days, patient will Achieve adherence to monitoring guidelines and medication adherence to achieve therapeutic efficacy Achieve control of hyperlipidemia and shortness of breath as evidenced by improved LDL, improved triglycerides, and improved shortness of breath Adhere to plan to optimize therapeutic regimen for hyperlipidemia as evidenced by report of adherence to recommended medication management changes through collaboration with PharmD and provider.   Interventions: 1:1 collaboration with [name], MD regarding development and update of comprehensive plan of care as evidenced by provider attestation and co-signature Inter-disciplinary care team collaboration (see longitudinal plan of  care) Comprehensive medication review performed; medication list updated in electronic medical record  Hyperlipidemia: Uncontrolled. LDL above goal of <70 due to very high risk given 10-year risk >20% per 2020 AACE/ACE guidelines. Triglycerides above goal of <150 per 2020 AACE/ACE guidelines. Patient reports he has improved his diet by reducing fatty foods and is eating more fruits and vegetables.  Efforts praised. Current medications:  patient has restarted taking red yeast rice, fish oil, and CoQ10 Intolerances: none Taking medications as directed: no, patient picked up rosuvastatin prescription from pharmacy but did not start taking it since he read about potential side effects. Side effects thought to be attributed to current medication regimen: n/a Dicussed risks and benefits with patient. Discussed that just because something is "natural" does not mean it is without risk of side effects. Encouraged that he start rosuvastatin 20 mg by mouth once daily instead of current regimen. Patient verbalized understanding and said he would think about it. Recommended that the patient come in for lipid panel in December to check lipids again. If LDL remains above goal, will continue to recommend statin therapy. If patient apprehensive of statin therapy, could also consider addition of ezetimibe 10 mg by mouth daily Encourage dietary reduction of high fat containing foods such as butter, nuts, bacon, egg yolks, etc. Recommend regular aerobic exercise Reviewed risks of hyperlipidemia, principles of treatment and consequences of untreated hyperlipidemia  Shortness of breath (unclear if formally diagnosed with asthma or chronic obstructive pulmonary disease): Controlled with improved inhaler compliance Patient reports history of smoking best quit in 1984 Current treatment: albuterol metered dose (ProAir, Ventolin, Proventil) 1 puff by mouth twice daily and fluticasone furoate/vilanterol (Breo Ellipta) 200-25  mcg 1 puff by mouth once daily Reminded patient to rinse mouth with water and spit after using ICS containing inhaler to prevent thrush Continue current inhaler regimen  Prostate cancer (Followed by Dr. Janese Banks) Controlled. PSA levels responding well Current medications: Eligard 45 mg subcutaneously every 6 months  Anxiety/Depression Controlled per patient Current medications: buspirone 10 mg by mouth twice daily Offered support from [place]; however, patient declines today  Patient Goals/Self-Care Activities Over the next 90 days, patient will:  Take medications as prescribed Engage in dietary modifications by decreased fat intake  Follow Up Plan: Telephone follow up appointment with care management team member scheduled for: 04/06/21      Medication Assistance: None required.  Patient affirms current coverage meets needs.  Patient's preferred pharmacy is:  [facility] [address] [place] [address] [place] [address] [place] [address] Phone: [phone] Fax: [phone]  Follow Up:  Patient agrees to Care Plan and Follow-up.  Plan: Telephone follow up appointment with care management team member scheduled for:  04/06/21  [name], PharmD Clinical Pharmacist [place] [phone]

## 2021-02-02 NOTE — Patient Instructions (Signed)
[name],  It was great to talk to you today! Please consider starting rosuvastatin as we discussed.  Please call me with any questions or concerns.   Visit Information  PATIENT GOALS:  Goals Addressed             This Visit's Progress    Medication Management       Patient Goals/Self-Care Activities Over the next 90 days, patient will:  Take medications as prescribed Engage in dietary modifications by decreased fat intake          Patient verbalizes understanding of instructions provided today and agrees to view in [place].   Telephone follow up appointment with care management team member scheduled for:04/06/21  [name], PharmD Clinical Pharmacist [place] [phone]

## 2021-02-19 DIAGNOSIS — E782 Mixed hyperlipidemia: Secondary | ICD-10-CM | POA: Diagnosis not present

## 2021-02-19 DIAGNOSIS — F321 Major depressive disorder, single episode, moderate: Secondary | ICD-10-CM

## 2021-02-19 DIAGNOSIS — C61 Malignant neoplasm of prostate: Secondary | ICD-10-CM | POA: Diagnosis not present

## 2021-02-19 DIAGNOSIS — R69 Illness, unspecified: Secondary | ICD-10-CM | POA: Diagnosis not present

## 2021-03-08 ENCOUNTER — Ambulatory Visit: Payer: Medicare HMO | Admitting: Family Medicine

## 2021-03-13 ENCOUNTER — Telehealth: Payer: Self-pay | Admitting: Family Medicine

## 2021-03-13 NOTE — Telephone Encounter (Signed)
Patient has appointment 12/7 to go over labs. He stopped by office today to see if he needed to get more labs done prior to appointment. If so , please advise.   CB#  [phone]

## 2021-03-13 NOTE — Telephone Encounter (Signed)
Needs lipid and liver before follow-up visit It appears that this was ordered previously he just needs to go get it done

## 2021-03-13 NOTE — Telephone Encounter (Signed)
Patient notified and verbalized understanding. 

## 2021-03-22 DIAGNOSIS — E782 Mixed hyperlipidemia: Secondary | ICD-10-CM | POA: Diagnosis not present

## 2021-03-22 DIAGNOSIS — Z79899 Other long term (current) drug therapy: Secondary | ICD-10-CM | POA: Diagnosis not present

## 2021-03-23 LAB — HEPATIC FUNCTION PANEL
ALT: 13 IU/L (ref 0–44)
AST: 19 IU/L (ref 0–40)
Albumin: 4.6 g/dL (ref 3.7–4.7)
Alkaline Phosphatase: 82 IU/L (ref 44–121)
Bilirubin Total: 0.4 mg/dL (ref 0.0–1.2)
Bilirubin, Direct: 0.11 mg/dL (ref 0.00–0.40)
Total Protein: 6.8 g/dL (ref 6.0–8.5)

## 2021-03-23 LAB — LIPID PANEL
Chol/HDL Ratio: 3.7 ratio (ref 0.0–5.0)
Cholesterol, Total: 285 mg/dL — ABNORMAL HIGH (ref 100–199)
HDL: 77 mg/dL (ref >39)
LDL Chol Calc (NIH): 193 mg/dL — ABNORMAL HIGH (ref 0–99)
Triglycerides: 89 mg/dL (ref 0–149)
VLDL Cholesterol Cal: 15 mg/dL (ref 5–40)

## 2021-03-28 ENCOUNTER — Ambulatory Visit (INDEPENDENT_AMBULATORY_CARE_PROVIDER_SITE_OTHER): Payer: Medicare HMO | Admitting: Family Medicine

## 2021-03-28 ENCOUNTER — Other Ambulatory Visit: Payer: Self-pay

## 2021-03-28 ENCOUNTER — Encounter: Payer: Self-pay | Admitting: Family Medicine

## 2021-03-28 VITALS — BP 136/84 | HR 95 | Temp 97.3°F | Ht 63.0 in | Wt 185.0 lb

## 2021-03-28 DIAGNOSIS — E038 Other specified hypothyroidism: Secondary | ICD-10-CM

## 2021-03-28 DIAGNOSIS — E782 Mixed hyperlipidemia: Secondary | ICD-10-CM | POA: Diagnosis not present

## 2021-03-28 MED ORDER — LEVOTHYROXINE SODIUM 50 MCG PO TABS: ORAL_TABLET | ORAL | 5 refills | Status: DC

## 2021-03-28 NOTE — Progress Notes (Signed)
   Subjective:    Patient ID: Thomas [name], male    DOB: 1945/08/17, 75 y.o.   MRN: [id number]  HPI Patient does state he had a fall couple weeks ago passed out does not feel he injured anything.  He thinks he just got up too quick. Hyperlipidemia, hypothyroid follow up  His stress levels are going okay He does find himself feeling a little bit down and sad at times but states depression doing much better Working his way through separation issues  Review lab results  Results for orders placed or performed in visit on 01/12/21  Lipid Profile  Result Value Ref Range   Cholesterol, Total 285 (H) 100 - 199 mg/dL   Triglycerides 89 0 - 149 mg/dL   HDL 77 >39 mg/dL   VLDL Cholesterol Cal 15 5 - 40 mg/dL   LDL Chol Calc (NIH) 193 (H) 0 - 99 mg/dL   Lipid Comment: Comment    Chol/HDL Ratio 3.7 0.0 - 5.0 ratio  Hepatic function panel  Result Value Ref Range   Total Protein 6.8 6.0 - 8.5 g/dL   Albumin 4.6 3.7 - 4.7 g/dL   Bilirubin Total 0.4 0.0 - 1.2 mg/dL   Bilirubin, Direct 0.11 0.00 - 0.40 mg/dL   Alkaline Phosphatase 82 44 - 121 IU/L   AST 19 0 - 40 IU/L   ALT 13 0 - 44 IU/L    Review of Systems     Objective:   Physical Exam  General-in no acute distress Eyes-no discharge Lungs-respiratory rate normal, CTA CV-no murmurs,RRR Extremities skin warm dry no edema Neuro grossly normal Behavior normal, alert       Assessment & Plan:  1. Mixed hyperlipidemia Patient is very adamant that he can get this down with diet alone we did discuss how diet has its limitations but nonetheless he wants to give it a good go visit he states he would possibly consider medicines but right now he is opposed to statins - TSH - Lipid panel  2. Other specified hypothyroidism He does state he taking his thyroid medicine we will recheck labs again in a few months time with follow-up office visit at that time - TSH - Lipid panel

## 2021-04-06 ENCOUNTER — Ambulatory Visit (INDEPENDENT_AMBULATORY_CARE_PROVIDER_SITE_OTHER): Payer: Medicare HMO | Admitting: Pharmacist

## 2021-04-06 DIAGNOSIS — E782 Mixed hyperlipidemia: Secondary | ICD-10-CM

## 2021-04-06 DIAGNOSIS — F321 Major depressive disorder, single episode, moderate: Secondary | ICD-10-CM

## 2021-04-06 DIAGNOSIS — C61 Malignant neoplasm of prostate: Secondary | ICD-10-CM

## 2021-04-06 MED ORDER — EZETIMIBE 10 MG PO TABS: 10.0000 mg | ORAL_TABLET | Freq: Every day | ORAL | 5 refills | Status: DC

## 2021-04-06 NOTE — Patient Instructions (Signed)
Thomas [name],  It was great to talk to you today!  Please call me with any questions or concerns.   Visit Information  Following are the goals we discussed today:  Patient Goals/Self-Care Activities Over the next 90 days, patient will:  Take medications as prescribed Engage in dietary modifications by decreased fat intake  Plan: Telephone follow up appointment with care management team member scheduled for:  05/07/21  [name], PharmD, BCACP, CPP Clinical Pharmacist Practitioner [place] [phone]  Please call the care guide team at [phone] if you need to cancel or reschedule your appointment.   Patient verbalizes understanding of instructions provided today and agrees to view in [place].

## 2021-04-06 NOTE — Chronic Care Management (AMB) (Signed)
Chronic Care Management Pharmacy Note  04/06/2021 Name:  SNYDER COLAVITO MRN:  [id number] DOB:  1945/10/20  Summary:  Hyperlipidemia Uncontrolled. LDL above goal of <70 due to very high risk given 10-year risk >20% per 2020 AACE/ACE guidelines.  Patient reports he has improved his diet by reducing fatty foods and is eating more fruits and vegetables. Continues to decline statins. LDL has only improved from 208>193.  Current medications: red yeast rice, fish oil, and CoQ10 Since patient continues to decline statins, long discussion had with patient regarding alternatives such as ezetimibe, adenosine triphosphate-citrate lyase (ACL) inhibitor, and PCSK9 inhibitors. Patient assistance evaluation completed and patient does not spend >$500 at the pharmacy in order to qualify for Praluent and he reports that he would not be able to afford the monthly co-pay through his insurance. Nexletol is not covered on his insurance plan and there is not a patient assistance program for this currently. [employer] lipid fund is currently closed. Patient agreeable to try ezetimibe 10 mg by mouth daily. Doubt this will help him achieve LDL goal since it only lowers LDL by ~20% on average but lowering LDL some will be helpful. If co-pay through insurance is too high, may need to use GoodRx.  Can re-check LDL 4-12 weeks after initiation of ezetimibe Will call patient in 4 weeks to assess tolerability of ezetimibe.   Subjective: Thomas [name] is an 75 y.o. year old male who is a primary patient of [name], Elayne Snare, MD.  The CCM team was consulted for assistance with disease management and care coordination needs.    Engaged with patient by telephone for follow up visit in response to provider referral for pharmacy case management and/or care coordination services.   Consent to Services:  The patient was given information about Chronic Care Management services, agreed to services, and gave verbal consent  prior to initiation of services.  Please see initial visit note for detailed documentation.   Patient Care Team: [name], MD as PCP - General (Family Medicine) Beryle Lathe, [facility] (Pharmacist)  Objective:  Lab Results  Component Value Date   CREATININE 0.95 12/27/2020   CREATININE 1.05 10/03/2020   CREATININE 1.07 12/03/2019    No results found for: HGBA1C Last diabetic Eye exam: No results found for: HMDIABEYEEXA  Last diabetic Foot exam: No results found for: HMDIABFOOTEX      Component Value Date/Time   CHOL 285 (H) 03/22/2021 1341   TRIG 89 03/22/2021 1341   HDL 77 03/22/2021 1341   CHOLHDL 3.7 03/22/2021 1341   CHOLHDL 4.6 12/27/2020 0839   VLDL 35 12/27/2020 0839   LDLCALC 193 (H) 03/22/2021 1341    Hepatic Function Latest Ref Rng & Units 03/22/2021 12/27/2020 10/03/2020  Total Protein 6.0 - 8.5 g/dL 6.8 7.4 6.9  Albumin 3.7 - 4.7 g/dL 4.6 4.4 4.0  AST 0 - 40 IU/L _0 ALT 0 - 44 IU/L _1 Alk Phosphatase 44 - 121 IU/L 82 70 76  Total Bilirubin 0.0 - 1.2 mg/dL 0.4 0.4 0.6  Bilirubin, Direct 0.00 - 0.40 mg/dL 0.11 - -    Lab Results  Component Value Date/Time   TSH 1.841 12/27/2020 08:39 AM   TSH 4.750 (H) 07/11/2020 08:14 AM   TSH 4.780 (H) 12/03/2019 08:02 AM   FREET4 1.01 07/11/2020 08:14 AM   FREET4 1.08 08/12/2019 09:47 AM    CBC Latest Ref Rng & Units 12/27/2020 10/03/2020 11/15/2019  WBC 4.0 -  10.5 K/uL 6.4 6.5 6.9  Hemoglobin 13.0 - 17.0 g/dL 14.6 14.3 13.7  Hematocrit 39.0 - 52.0 % 42.0 41.8 39.7  Platelets 150 - 400 K/uL 188 215 207.0    No results found for: VD25OH  Clinical ASCVD: No  The 10-year ASCVD risk score (Arnett DK, et al., 2019) is: 28%   Values used to calculate the score:     Age: 16 years     Sex: Male     Is Non-Hispanic African American: No     Diabetic: No     Tobacco smoker: No     Systolic Blood Pressure: 549 mmHg     Is BP treated: No     HDL Cholesterol: 77 mg/dL     Total Cholesterol: 285  mg/dL     Social History   Tobacco Use  Smoking Status Former   Types: Cigarettes   Quit date: 04/22/1982   Years since quitting: 38.9  Smokeless Tobacco Never   BP Readings from Last 3 Encounters:  03/28/21 136/84  01/08/21 140/83  01/08/21 134/77   Pulse Readings from Last 3 Encounters:  03/28/21 95  01/08/21 93  01/08/21 91   Wt Readings from Last 3 Encounters:  03/28/21 185 lb (83.9 kg)  01/08/21 184 lb 9.6 oz (83.7 kg)  01/08/21 184 lb 8 oz (83.7 kg)    Assessment: Review of patient past medical history, allergies, medications, health status, including review of consultants reports, laboratory and other test data, was performed as part of comprehensive evaluation and provision of chronic care management services.   SDOH:  (Social Determinants of Health) assessments and interventions performed:    CCM Care Plan  Allergies  Allergen Reactions   Augmentin [Amoxicillin-Pot Clavulanate]     Gi upset   Ciprofloxacin    Nsaids Other (See Comments)    Stomach ulcer     Medications Reviewed Today     Reviewed by Beryle Lathe, [facility] (Pharmacist) on 04/06/21 at 1318  Med List Status: <None>   Medication Order Taking? Sig Documenting Provider Last Dose Status Informant  acetaminophen (TYLENOL) 500 MG tablet [id number] Yes Take 500 mg by mouth every 6 (six) hours as needed. [provider] Taking Active Self  albuterol (VENTOLIN HFA) 108 (90 Base) MCG/ACT inhaler [id number] Yes TAKE 2 PUFFS BY MOUTH EVERY 6 HOURS AS NEEDED FOR WHEEZE [name], Elayne Snare, MD Taking Active            Med Note Waldo Laine, Gwenyth Allegra   Fri Jan 12, 2021  9:12 AM) Uses 2 puffs daily  Ascorbic Acid (VITAMIN C) 100 MG tablet [id number] Yes Take 100 mg by mouth daily. [provider] Taking Active   busPIRone (BUSPAR) 10 MG tablet [id number] Yes Take 1 tablet (10 mg total) by mouth 2 (two) times daily. [name], MD Taking Active   co-enzyme Q-10 30 MG capsule [id number]  Yes Take 30 mg by mouth daily. [provider] Taking Active Self  ferrous sulfate 325 (65 FE) MG tablet [id number] Yes Take 325 mg by mouth daily with breakfast. [provider] Taking Active   fexofenadine (ALLEGRA) 180 MG tablet [id number] Yes Take 180 mg by mouth daily. [provider] Taking Active Self  FIBER PO [id number] Yes Take 1 tablet by mouth daily. [provider] Taking Active   fluticasone furoate-vilanterol (BREO ELLIPTA) 200-25 MCG/INH AEPB [id number] Yes 1 puff q am daily [name], Elayne Snare, MD Taking Active  Med Note Beryle Lathe   Fri Feb 02, 2021  4:15 PM)    levothyroxine (SYNTHROID) 50 MCG tablet [id number] Yes Take 1 1/2 tablets on M W Fri Sat Sun and take 1 on Tues and Thursday [name], MD Taking Active   Melatonin 1 MG TABS [id number] Yes Take 5 mg by mouth at bedtime as needed. [provider] Taking Active   Multiple Vitamins-Minerals (MULTIVITAMIN WITH MINERALS) tablet [id number] Yes Take 1 tablet by mouth daily. [provider] Taking Active   Omega-3 Fatty Acids (FISH OIL) 1000 MG CAPS [id number] Yes Take by mouth. [provider] Taking Active   Red Yeast Rice Extract 300 MG CAPS [id number] Yes Take 1 tablet by mouth daily. [provider] Taking Active Self  triamcinolone (KENALOG) 0.1 % [id number] Yes Apply 1 application topically 2 (two) times daily. [name], MD Taking Active   Med List Note Carmelina Noun, LPN 85/63/14 9702): -            Patient Active Problem List   Diagnosis Date Noted   Chronic bilateral low back pain without sciatica 08/22/2020   Malignant neoplasm of prostate ([place]) 06/30/2020   Hypothyroidism 03/16/2019   GERD (gastroesophageal reflux disease) 11/29/2015   Insomnia 11/29/2015   Prostate cancer ([place]) 12/30/2014   Hyperlipidemia 12/30/2014   Anemia 07/19/2013   Iron deficiency 07/19/2013   LUQ abdominal pain 07/19/2013   Rectal  bleeding 07/19/2013    Immunization History  Administered Date(s) Administered   Fluad Quad(high Dose 65+) 02/01/2020   Influenza-Unspecified 02/03/2021   PFIZER(Purple Top)SARS-COV-2 Vaccination 05/15/2019, 06/07/2019, 02/03/2021   Pneumococcal Conjugate-13 04/26/2015   Pneumococcal Polysaccharide-23 03/09/2014    Conditions to be addressed/monitored: HLD, Anxiety, Depression, and cancer  Care Plan : Medication Management  Updates made by Beryle Lathe, [place] since 04/06/2021 12:00 AM     Problem: Prostate Cancer, Hyperlipidemia, Shortness of Breath, and Anxiety/Depression   Priority: High  Onset Date: 01/12/2021     Long-Range Goal: Disease Progression Prevention   Start Date: 01/12/2021  Expected End Date: 04/12/2021  Recent Progress: On track  Priority: High  Note:   Current Barriers:  Unable to independently monitor therapeutic efficacy Unable to achieve control of hyperlipidemia Suboptimal therapeutic regimen for hyperlipidemia  Pharmacist Clinical Goal(s):  Over the next 90 days, patient will Achieve adherence to monitoring guidelines and medication adherence to achieve therapeutic efficacy Achieve control of hyperlipidemia as evidenced by improved LDL, improved triglycerides, and improved shortness of breath Adhere to plan to optimize therapeutic regimen for hyperlipidemia as evidenced by report of adherence to recommended medication management changes through collaboration with PharmD and provider.   Interventions: 1:1 collaboration with [name], MD regarding development and update of comprehensive plan of care as evidenced by provider attestation and co-signature Inter-disciplinary care team collaboration (see longitudinal plan of care) Comprehensive medication review performed; medication list updated in electronic medical record  Hyperlipidemia - Goal on track (progressing) - YES: Uncontrolled. LDL above goal of <70 due to very high risk given  10-year risk >20% per 2020 AACE/ACE guidelines. Triglycerides at goal of <150 per 2020 AACE/ACE guidelines. Patient reports he has improved his diet by reducing fatty foods and is eating more fruits and vegetables. Continues to decline statins. LDL has only improved from 208>193.  Current medications:  red yeast rice, fish oil, and CoQ10 Intolerances: none Taking medications as directed: n/a Side effects thought to be attributed to current medication regimen: n/a Since patient continues  to decline statins, long discussion had with patient regarding alternatives such as ezetimibe, adenosine triphosphate-citrate lyase (ACL) inhibitor, and PCSK9 inhibitors. Patient assistance evaluation completed and patient does not spend >$500 at the pharmacy in order to qualify for Praluent and he reports that he would not be able to afford the monthly co-pay through his insurance. Nexletol is not covered on his insurance plan and there is not a patient assistance program for this currently. [employer] lipid fund is currently closed. Patient agreeable to try ezetimibe 10 mg by mouth daily. Doubt this will help him achieve LDL goal since it only lowers LDL by ~20% on average but lowering LDL some will be helpful. If co-pay through insurance is too high, may need to use GoodRx.  Can re-check LDL 4-12 weeks after initiation of ezetimibe Will call patient in 4 weeks to assess tolerability of ezetimibe.  Encourage dietary reduction of high fat containing foods such as butter, nuts, bacon, egg yolks, etc. Recommend regular aerobic exercise Reviewed risks of hyperlipidemia, principles of treatment and consequences of untreated hyperlipidemia  Shortness of breath (unclear if formally diagnosed with asthma or chronic obstructive pulmonary disease) - condition stable - not address at this visit: Controlled with improved inhaler compliance Patient reports history of smoking best quit in 1984 Current treatment: albuterol  metered dose (ProAir, Ventolin, Proventil) 1 puff by mouth twice daily and fluticasone furoate/vilanterol (Breo Ellipta) 200-25 mcg 1 puff by mouth once daily Reminded patient to rinse mouth with water and spit after using ICS containing inhaler to prevent thrush Continue current inhaler regimen  Prostate cancer (Followed by Dr. Janese Banks)- condition stable - not address at this visit: Controlled. PSA levels responding well Current medications: Eligard 45 mg subcutaneously every 6 months  Anxiety/Depression- condition stable - not address at this visit: Controlled per patient Current medications: buspirone 10 mg by mouth twice daily Offered support from [place]; however, patient declines today  Patient Goals/Self-Care Activities Over the next 90 days, patient will:  Take medications as prescribed Engage in dietary modifications by decreased fat intake  Follow Up Plan: Telephone follow up appointment with care management team member scheduled for: 05/07/21      Medication Assistance: None required.  Patient affirms current coverage meets needs. Patient may benefit from applying to Medicare Extra Help  Patient's preferred pharmacy is:  [facility] [address] [place] - [place] [address] [place] [address] [place] [address] Phone: [phone] Fax: [phone]  Follow Up:  Patient agrees to Care Plan and Follow-up.  Plan: Telephone follow up appointment with care management team member scheduled for:  05/07/21  [name], PharmD, BCACP, CPP Clinical Pharmacist Practitioner [place] [phone]

## 2021-04-21 DIAGNOSIS — F321 Major depressive disorder, single episode, moderate: Secondary | ICD-10-CM | POA: Diagnosis not present

## 2021-04-21 DIAGNOSIS — R69 Illness, unspecified: Secondary | ICD-10-CM | POA: Diagnosis not present

## 2021-04-21 DIAGNOSIS — E782 Mixed hyperlipidemia: Secondary | ICD-10-CM

## 2021-04-21 DIAGNOSIS — C61 Malignant neoplasm of prostate: Secondary | ICD-10-CM | POA: Diagnosis not present

## 2021-04-28 DIAGNOSIS — E785 Hyperlipidemia, unspecified: Secondary | ICD-10-CM | POA: Diagnosis not present

## 2021-04-28 DIAGNOSIS — F419 Anxiety disorder, unspecified: Secondary | ICD-10-CM | POA: Diagnosis not present

## 2021-04-28 DIAGNOSIS — F3341 Major depressive disorder, recurrent, in partial remission: Secondary | ICD-10-CM | POA: Diagnosis not present

## 2021-04-28 DIAGNOSIS — E669 Obesity, unspecified: Secondary | ICD-10-CM | POA: Diagnosis not present

## 2021-04-28 DIAGNOSIS — J309 Allergic rhinitis, unspecified: Secondary | ICD-10-CM | POA: Diagnosis not present

## 2021-04-28 DIAGNOSIS — M1990 Unspecified osteoarthritis, unspecified site: Secondary | ICD-10-CM | POA: Diagnosis not present

## 2021-04-28 DIAGNOSIS — M810 Age-related osteoporosis without current pathological fracture: Secondary | ICD-10-CM | POA: Diagnosis not present

## 2021-04-28 DIAGNOSIS — C61 Malignant neoplasm of prostate: Secondary | ICD-10-CM | POA: Diagnosis not present

## 2021-04-28 DIAGNOSIS — J449 Chronic obstructive pulmonary disease, unspecified: Secondary | ICD-10-CM | POA: Diagnosis not present

## 2021-04-28 DIAGNOSIS — R69 Illness, unspecified: Secondary | ICD-10-CM | POA: Diagnosis not present

## 2021-04-28 DIAGNOSIS — E039 Hypothyroidism, unspecified: Secondary | ICD-10-CM | POA: Diagnosis not present

## 2021-04-28 DIAGNOSIS — G8929 Other chronic pain: Secondary | ICD-10-CM | POA: Diagnosis not present

## 2021-04-28 DIAGNOSIS — L309 Dermatitis, unspecified: Secondary | ICD-10-CM | POA: Diagnosis not present

## 2021-05-03 ENCOUNTER — Other Ambulatory Visit: Payer: Self-pay | Admitting: Family Medicine

## 2021-05-07 ENCOUNTER — Telehealth: Payer: Self-pay | Admitting: Pharmacist

## 2021-05-07 ENCOUNTER — Telehealth: Payer: Medicare HMO

## 2021-05-07 NOTE — Telephone Encounter (Signed)
°  Care Management   Follow Up Note  05/07/2021 Name: Thomas [name] MRN: [id number] DOB: 1945/09/22  Referred by: [name], MD Reason for referral : Chronic Care Management (Unsuccessful telephone outreach)  An unsuccessful telephone outreach was attempted today. The patient was referred to the case management team for assistance with care management and care coordination.   Follow Up Plan: The care management team will reach out to the patient again over the next 7 days.   [name], PharmD, [place], CPP Clinical Pharmacist Practitioner [place] [phone]

## 2021-05-21 ENCOUNTER — Telehealth: Payer: Self-pay | Admitting: Family Medicine

## 2021-05-21 ENCOUNTER — Other Ambulatory Visit: Payer: Self-pay | Admitting: *Deleted

## 2021-05-21 MED ORDER — NITROGLYCERIN 0.4 MG SL SUBL: 0.4000 mg | SUBLINGUAL_TABLET | SUBLINGUAL | 5 refills | Status: DC | PRN

## 2021-05-21 NOTE — Telephone Encounter (Signed)
May have 5 refills keep follow-up in April

## 2021-05-21 NOTE — Telephone Encounter (Signed)
Patient informed medication sent in and to keep follow up in April. Verbalized understanding.

## 2021-05-21 NOTE — Addendum Note (Signed)
Addended by: Madelin Rear on: 05/21/2021 01:36 PM   Modules accepted: Orders

## 2021-05-21 NOTE — Telephone Encounter (Signed)
[facility] requesting refill  on Nitroglycerin 0.4 mg tablet. Pharmacy states that patient request new Rx;has been over 2 years since last refill. Pt last seen 03/28/21 for follow up. Please advise. Thank you

## 2021-05-28 NOTE — Telephone Encounter (Signed)
Rescheduled for follow up on 06/04/21 . Thomas [name] pt would like to know if he needs to get labs prior to [place] in April with Dr. [name].  [place] Management  Direct Dial: [phone]

## 2021-06-04 ENCOUNTER — Ambulatory Visit (INDEPENDENT_AMBULATORY_CARE_PROVIDER_SITE_OTHER): Payer: Medicare HMO | Admitting: Pharmacist

## 2021-06-04 DIAGNOSIS — F321 Major depressive disorder, single episode, moderate: Secondary | ICD-10-CM

## 2021-06-04 DIAGNOSIS — E7849 Other hyperlipidemia: Secondary | ICD-10-CM

## 2021-06-04 DIAGNOSIS — C61 Malignant neoplasm of prostate: Secondary | ICD-10-CM

## 2021-06-04 NOTE — Chronic Care Management (AMB) (Signed)
Chronic Care Management Pharmacy Note  06/04/2021 Name:  Thomas [name] MRN:  [id number] DOB:  1946-01-23  Summary: Hyperlipidemia Uncontrolled. LDL above goal of <70 due to very high risk given 10-year risk >20% per 2020 AACE/ACE guidelines. Patient adamantly decline statins. LDL has only improved from 208>193 despite improvement in diet and over the counter supplements. At last visit, patient agreed to add ezetimibe. Patient reports today that he has been taking daily for 2 months and is tolerating it well.  Current medications: ezetimibe 10 mg by mouth once daily, red yeast rice, fish oil, and CoQ10 Encourage dietary reduction of high fat containing foods such as butter, nuts, bacon, egg yolks, etc. Recommend regular aerobic exercise Reviewed risks of hyperlipidemia, principles of treatment and consequences of untreated hyperlipidemia Continue ezetimibe 10 mg by mouth once daily Recheck fasting lipid panel. Order placed today. Patient instructed to come get within next week. Will follow-up with patient after results available. If LDL remains elevated on ezetimibe alone then may need to pursue Nexlizet or PCSK9i. Patient will need prior authorization and financial assistance. [place] funding currently available.  Subjective: [name] is an 76 y.o. year old male who is a primary patient of [name], Elayne Snare, MD.  The CCM team was consulted for assistance with disease management and care coordination needs.    Engaged with patient by telephone for follow up visit in response to provider referral for pharmacy case management and/or care coordination services.   Consent to Services:  The patient was given information about Chronic Care Management services, agreed to services, and gave verbal consent prior to initiation of services.  Please see initial visit note for detailed documentation.   Patient Care Team: [name], MD as PCP - General (Family  Medicine) Beryle Lathe, [facility] (Pharmacist)  Objective:  Lab Results  Component Value Date   CREATININE 0.95 12/27/2020   CREATININE 1.05 10/03/2020   CREATININE 1.07 12/03/2019    No results found for: HGBA1C Last diabetic Eye exam: No results found for: HMDIABEYEEXA  Last diabetic Foot exam: No results found for: HMDIABFOOTEX      Component Value Date/Time   CHOL 285 (H) 03/22/2021 1341   TRIG 89 03/22/2021 1341   HDL 77 03/22/2021 1341   CHOLHDL 3.7 03/22/2021 1341   CHOLHDL 4.6 12/27/2020 0839   VLDL 35 12/27/2020 0839   LDLCALC 193 (H) 03/22/2021 1341    Hepatic Function Latest Ref Rng & Units 03/22/2021 12/27/2020 10/03/2020  Total Protein 6.0 - 8.5 g/dL 6.8 7.4 6.9  Albumin 3.7 - 4.7 g/dL 4.6 4.4 4.0  AST 0 - 40 IU/L '19 18 17  ' ALT 0 - 44 IU/L '13 15 17  ' Alk Phosphatase 44 - 121 IU/L 82 70 76  Total Bilirubin 0.0 - 1.2 mg/dL 0.4 0.4 0.6  Bilirubin, Direct 0.00 - 0.40 mg/dL 0.11 - -    Lab Results  Component Value Date/Time   TSH 1.841 12/27/2020 08:39 AM   TSH 4.750 (H) 07/11/2020 08:14 AM   TSH 4.780 (H) 12/03/2019 08:02 AM   FREET4 1.01 07/11/2020 08:14 AM   FREET4 1.08 08/12/2019 09:47 AM    CBC Latest Ref Rng & Units 12/27/2020 10/03/2020 11/15/2019  WBC 4.0 - 10.5 K/uL 6.4 6.5 6.9  Hemoglobin 13.0 - 17.0 g/dL 14.6 14.3 13.7  Hematocrit 39.0 - 52.0 % 42.0 41.8 39.7  Platelets 150 - 400 K/uL 188 215 207.0    No results found for: VD25OH  Clinical  ASCVD: No  The 10-year ASCVD risk score (Arnett DK, et al., 2019) is: 29.6%   Values used to calculate the score:     Age: 76 years     Sex: Male     Is Non-Hispanic African American: No     Diabetic: No     Tobacco smoker: No     Systolic Blood Pressure: 620 mmHg     Is BP treated: No     HDL Cholesterol: 77 mg/dL     Total Cholesterol: 285 mg/dL    Social History   Tobacco Use  Smoking Status Former   Types: Cigarettes   Quit date: 04/22/1982   Years since quitting: 39.1  Smokeless Tobacco  Never   BP Readings from Last 3 Encounters:  03/28/21 136/84  01/08/21 140/83  01/08/21 134/77   Pulse Readings from Last 3 Encounters:  03/28/21 95  01/08/21 93  01/08/21 91   Wt Readings from Last 3 Encounters:  03/28/21 185 lb (83.9 kg)  01/08/21 184 lb 9.6 oz (83.7 kg)  01/08/21 184 lb 8 oz (83.7 kg)    Assessment: Review of patient past medical history, allergies, medications, health status, including review of consultants reports, laboratory and other test data, was performed as part of comprehensive evaluation and provision of chronic care management services.   SDOH:  (Social Determinants of Health) assessments and interventions performed:    CCM Care Plan  Allergies  Allergen Reactions   Augmentin [Amoxicillin-Pot Clavulanate]     Gi upset   Ciprofloxacin    Nsaids Other (See Comments)    Stomach ulcer     Medications Reviewed Today     Reviewed by Beryle Lathe, [facility] (Pharmacist) on 06/04/21 at 1037  Med List Status: <None>   Medication Order Taking? Sig Documenting Provider Last Dose Status Informant  acetaminophen (TYLENOL) 500 MG tablet [id number] Yes Take 500 mg by mouth every 6 (six) hours as needed. [provider] Taking Active Self  albuterol (VENTOLIN HFA) 108 (90 Base) MCG/ACT inhaler [id number] Yes TAKE 2 PUFFS BY MOUTH EVERY 6 HOURS AS NEEDED FOR WHEEZE [name], Elayne Snare, MD Taking Active            Med Note Thomas [name], Gwenyth Allegra   Fri Jan 12, 2021  9:12 AM) Uses 2 puffs daily  Ascorbic Acid (VITAMIN C) 100 MG tablet [id number] Yes Take 100 mg by mouth daily. [provider] Taking Active   busPIRone (BUSPAR) 10 MG tablet [id number] Yes Take 1 tablet by mouth twice daily [name], Scott A, MD Taking Active   co-enzyme Q-10 30 MG capsule [id number] Yes Take 30 mg by mouth daily. [provider] Taking Active Self  ezetimibe (ZETIA) 10 MG tablet [id number] Yes Take 1 tablet (10 mg total) by mouth daily. [name], MD  Taking Active   ferrous sulfate 325 (65 FE) MG tablet [id number] Yes Take 325 mg by mouth daily with breakfast. [provider] Taking Active   fexofenadine (ALLEGRA) 180 MG tablet [id number] Yes Take 180 mg by mouth daily. [provider] Taking Active Self  FIBER PO [id number] Yes Take 1 tablet by mouth daily. [provider] Taking Active   fluticasone furoate-vilanterol (BREO ELLIPTA) 200-25 MCG/INH AEPB [id number] Yes 1 puff q am daily [name], MD Taking Active            Med Note Beryle Lathe   Fri Feb 02, 2021  4:15 PM)    levothyroxine (SYNTHROID) 50 MCG  tablet [id number] Yes Take 1 1/2 tablets on M W Fri Sat Sun and take 1 on Tues and Thursday [name], MD Taking Active   Melatonin 1 MG TABS [id number] Yes Take 5 mg by mouth at bedtime as needed. [provider] Taking Active   Multiple Vitamins-Minerals (MULTIVITAMIN WITH MINERALS) tablet [id number] Yes Take 1 tablet by mouth daily. [provider] Taking Active   nitroGLYCERIN (NITROSTAT) 0.4 MG SL tablet [id number] No Place 1 tablet (0.4 mg total) under the tongue every 5 (five) minutes as needed for chest pain. [name], MD Unknown Active   Omega-3 Fatty Acids (FISH OIL) 1000 MG CAPS [id number] Yes Take by mouth. [provider] Taking Active   Red Yeast Rice Extract 300 MG CAPS [id number] Yes Take 1 tablet by mouth daily. [provider] Taking Active Self  triamcinolone (KENALOG) 0.1 % [id number] Yes Apply 1 application topically 2 (two) times daily. [name], MD Taking Active   Med List Note Carmelina Noun, LPN 03/54/65 6812): -            Patient Active Problem List   Diagnosis Date Noted   Chronic bilateral low back pain without sciatica 08/22/2020   Malignant neoplasm of prostate ([place]) 06/30/2020   Hypothyroidism 03/16/2019   GERD (gastroesophageal reflux disease) 11/29/2015   Insomnia 11/29/2015   Prostate cancer ([place])  12/30/2014   Hyperlipidemia 12/30/2014   Anemia 07/19/2013   Iron deficiency 07/19/2013   LUQ abdominal pain 07/19/2013   Rectal bleeding 07/19/2013    Immunization History  Administered Date(s) Administered   Fluad Quad(high Dose 65+) 02/01/2020   Influenza-Unspecified 02/03/2021   PFIZER(Purple Top)SARS-COV-2 Vaccination 05/15/2019, 06/07/2019, 02/03/2021   Pneumococcal Conjugate-13 04/26/2015   Pneumococcal Polysaccharide-23 03/09/2014    Conditions to be addressed/monitored: HLD, COPD, Anxiety, Depression, and cancer  Care Plan : Medication Management  Updates made by Beryle Lathe, [place] since 06/04/2021 12:00 AM     Problem: Prostate Cancer, Hyperlipidemia, Shortness of Breath, and Anxiety/Depression   Priority: High  Onset Date: 01/12/2021     Long-Range Goal: Disease Progression Prevention   Start Date: 01/12/2021  Expected End Date: 04/12/2021  Recent Progress: On track  Priority: High  Note:   Current Barriers:  Unable to independently monitor therapeutic efficacy Unable to achieve control of hyperlipidemia Suboptimal therapeutic regimen for hyperlipidemia  Pharmacist Clinical Goal(s):  Through collaboration with PharmD and provider, patient will:  Achieve adherence to monitoring guidelines and medication adherence to achieve therapeutic efficacy Achieve control of hyperlipidemia as evidenced by improved LDL, improved triglycerides, and improved shortness of breath Adhere to plan to optimize therapeutic regimen for hyperlipidemia as evidenced by report of adherence to recommended medication management changes   Interventions: 1:1 collaboration with [name], MD regarding development and update of comprehensive plan of care as evidenced by provider attestation and co-signature Inter-disciplinary care team collaboration (see longitudinal plan of care) Comprehensive medication review performed; medication list updated in electronic medical  record  Hyperlipidemia - Goal on track (progressing) - YES: Uncontrolled. LDL above goal of <70 due to very high risk given 10-year risk >20% per 2020 AACE/ACE guidelines. Triglycerides at goal of <150 per 2020 AACE/ACE guidelines. Patient adamantly decline statins. LDL has only improved from 208>193 despite improvement in diet and over the counter supplements. At last visit, patient agreed to add ezetimibe. Patient reports today that he has been taking daily for 2 months and is tolerating it well.  Current medications: ezetimibe 10 mg  by mouth once daily, red yeast rice, fish oil, and CoQ10 Intolerances: none Taking medications as directed: yes Side effects thought to be attributed to current medication regimen: no Encourage dietary reduction of high fat containing foods such as butter, nuts, bacon, egg yolks, etc. Recommend regular aerobic exercise Reviewed risks of hyperlipidemia, principles of treatment and consequences of untreated hyperlipidemia Continue ezetimibe 10 mg by mouth once daily Recheck fasting lipid panel. Order placed today. Patient instructed to come get within next week. Will follow-up with patient after results available. If LDL remains elevated on ezetimibe alone then may need to pursue Nexlizet or PCSK9i. Patient will need prior authorization and financial assistance. [place] funding currently available.  Shortness of breath (unclear if formally diagnosed with asthma or chronic obstructive pulmonary disease) - condition stable - not address at this visit: Controlled with improved inhaler compliance Patient reports history of smoking best quit in 1984 Current treatment: albuterol metered dose (ProAir, Ventolin, Proventil) 1 puff by mouth twice daily and fluticasone furoate/vilanterol (Breo Ellipta) 200-25 mcg 1 puff by mouth once daily Reminded patient to rinse mouth with water and spit after using ICS containing inhaler to prevent thrush Continue current inhaler  regimen  Prostate cancer (Followed by Dr. Janese Banks)- condition stable - not address at this visit: Controlled. PSA levels responding well Current medications: Eligard 45 mg subcutaneously every 6 months  Anxiety/Depression- condition stable - not address at this visit: Controlled per patient Current medications: buspirone 10 mg by mouth twice daily Offered support from [place]; however, patient previously declined  Patient Goals/Self-Care Activities Patient will:  Take medications as prescribed Collaborate with provider on medication access solutions Engage in dietary modifications by decreased fat intake  Follow Up Plan: Telephone follow up appointment with care management team member scheduled for: 06/08/21      Medication Assistance: None required.  Patient affirms current coverage meets needs.  Patient's preferred pharmacy is:  [facility] [address] [place] - [place] [address] [place] [address] [place] [address] Phone: [phone] Fax: [phone]  Follow Up:  Patient agrees to Care Plan and Follow-up.  Plan: Telephone follow up appointment with care management team member scheduled for:  06/08/21  [name], PharmD, BCACP, CPP Clinical Pharmacist Practitioner [place] [phone]

## 2021-06-04 NOTE — Patient Instructions (Signed)
Thomas [name],  It was great to talk to you today!  Please call me with any questions or concerns.  Visit Information  Following are the goals we discussed today:   Goals Addressed             This Visit's Progress    Medication Management       Patient Goals/Self-Care Activities Patient will:  Take medications as prescribed Collaborate with provider on medication access solutions Engage in dietary modifications by decreased fat intake           Follow-up plan: Telephone follow up appointment with care management team member scheduled for:  06/08/21  Patient verbalizes understanding of instructions and care plan provided today and agrees to view in [place]. Active MyChart status confirmed with patient.    Please call the care guide team at [phone] if you need to cancel or reschedule your appointment.   [name], PharmD, [place], CPP Clinical Pharmacist Practitioner [place] [phone]

## 2021-06-05 DIAGNOSIS — E7849 Other hyperlipidemia: Secondary | ICD-10-CM | POA: Diagnosis not present

## 2021-06-06 LAB — LIPID PANEL
Chol/HDL Ratio: 3.4 ratio (ref 0.0–5.0)
Cholesterol, Total: 251 mg/dL — ABNORMAL HIGH (ref 100–199)
HDL: 73 mg/dL (ref >39)
LDL Chol Calc (NIH): 141 mg/dL — ABNORMAL HIGH (ref 0–99)
Triglycerides: 211 mg/dL — ABNORMAL HIGH (ref 0–149)
VLDL Cholesterol Cal: 37 mg/dL (ref 5–40)

## 2021-06-08 ENCOUNTER — Telehealth: Payer: Medicare HMO

## 2021-06-11 ENCOUNTER — Telehealth: Payer: Medicare HMO

## 2021-06-18 ENCOUNTER — Telehealth: Payer: Self-pay | Admitting: *Deleted

## 2021-06-18 ENCOUNTER — Ambulatory Visit: Payer: Medicare HMO | Admitting: Pharmacist

## 2021-06-18 DIAGNOSIS — E782 Mixed hyperlipidemia: Secondary | ICD-10-CM

## 2021-06-18 DIAGNOSIS — C61 Malignant neoplasm of prostate: Secondary | ICD-10-CM

## 2021-06-18 DIAGNOSIS — F321 Major depressive disorder, single episode, moderate: Secondary | ICD-10-CM

## 2021-06-18 MED ORDER — PANTOPRAZOLE SODIUM 40 MG PO TBEC: 40.0000 mg | DELAYED_RELEASE_TABLET | Freq: Every day | ORAL | 1 refills | Status: DC

## 2021-06-18 NOTE — Chronic Care Management (AMB) (Signed)
Chronic Care Management Pharmacy Note  06/18/2021 Name:  Thomas [name] MRN:  [id number] DOB:  January 11, 1946  Summary: Hyperlipidemia: Uncontrolled but slightly improved with addition of ezetimibe. LDL decreased from 193 to 141 but remains above goal of <70 due to very high risk given 10-year risk >20% per 2020 AACE/ACE guidelines. Triglycerides now above goal of <150 per 2020 AACE/ACE guidelines. Current medications: ezetimibe 10 mg by mouth once daily, red yeast rice, fish oil, and CoQ10 Encourage dietary reduction of high fat containing foods such as butter, nuts, bacon, egg yolks, etc. Recommend regular aerobic exercise. Patient plans to join [employer] program today.  Discussed with patient that his cholesterol remains well above goal. Discussed trial of statin vs addition of PCSK9i vs no change in current regimen. Also considered switch to Nexlizet, but this is not covered by his insurance. Patient reports fear of needles (not willing to inject PCSK9i) and continues to decline any form of statin therapy. Patient prefers to continue current therapy at this time and is aware of risks associated with elevated cholesterol.  Continue ezetimibe 10 mg by mouth once daily  Subjective: Thomas [name] is an 76 y.o. year old male who is a primary patient of [name], Elayne Snare, MD.  The CCM team was consulted for assistance with disease management and care coordination needs.    Engaged with patient by telephone for follow up visit in response to provider referral for pharmacy case management and/or care coordination services.   Consent to Services:  The patient was given information about Chronic Care Management services, agreed to services, and gave verbal consent prior to initiation of services.  Please see initial visit note for detailed documentation.   Patient Care Team: [name], MD as PCP - General (Family Medicine) Beryle Lathe, [facility]  (Pharmacist)  Objective:  Lab Results  Component Value Date   CREATININE 0.95 12/27/2020   CREATININE 1.05 10/03/2020   CREATININE 1.07 12/03/2019    No results found for: HGBA1C Last diabetic Eye exam: No results found for: HMDIABEYEEXA  Last diabetic Foot exam: No results found for: HMDIABFOOTEX      Component Value Date/Time   CHOL 251 (H) 06/05/2021 0829   TRIG 211 (H) 06/05/2021 0829   HDL 73 06/05/2021 0829   CHOLHDL 3.4 06/05/2021 0829   CHOLHDL 4.6 12/27/2020 0839   VLDL 35 12/27/2020 0839   LDLCALC 141 (H) 06/05/2021 0829    Hepatic Function Latest Ref Rng & Units 03/22/2021 12/27/2020 10/03/2020  Total Protein 6.0 - 8.5 g/dL 6.8 7.4 6.9  Albumin 3.7 - 4.7 g/dL 4.6 4.4 4.0  AST 0 - 40 IU/L '19 18 17  ' ALT 0 - 44 IU/L '13 15 17  ' Alk Phosphatase 44 - 121 IU/L 82 70 76  Total Bilirubin 0.0 - 1.2 mg/dL 0.4 0.4 0.6  Bilirubin, Direct 0.00 - 0.40 mg/dL 0.11 - -    Lab Results  Component Value Date/Time   TSH 1.841 12/27/2020 08:39 AM   TSH 4.750 (H) 07/11/2020 08:14 AM   TSH 4.780 (H) 12/03/2019 08:02 AM   FREET4 1.01 07/11/2020 08:14 AM   FREET4 1.08 08/12/2019 09:47 AM    CBC Latest Ref Rng & Units 12/27/2020 10/03/2020 11/15/2019  WBC 4.0 - 10.5 K/uL 6.4 6.5 6.9  Hemoglobin 13.0 - 17.0 g/dL 14.6 14.3 13.7  Hematocrit 39.0 - 52.0 % 42.0 41.8 39.7  Platelets 150 - 400 K/uL 188 215 207.0    No results found for: VD25OH  Clinical ASCVD: No  The 10-year ASCVD risk score (Arnett DK, et al., 2019) is: 29.1%   Values used to calculate the score:     Age: 76 years     Sex: Male     Is Non-Hispanic African American: No     Diabetic: No     Tobacco smoker: No     Systolic Blood Pressure: 706 mmHg     Is BP treated: No     HDL Cholesterol: 73 mg/dL     Total Cholesterol: 251 mg/dL    Social History   Tobacco Use  Smoking Status Former   Types: Cigarettes   Quit date: 04/22/1982   Years since quitting: 39.1  Smokeless Tobacco Never   BP Readings from Last 3  Encounters:  03/28/21 136/84  01/08/21 140/83  01/08/21 134/77   Pulse Readings from Last 3 Encounters:  03/28/21 95  01/08/21 93  01/08/21 91   Wt Readings from Last 3 Encounters:  03/28/21 185 lb (83.9 kg)  01/08/21 184 lb 9.6 oz (83.7 kg)  01/08/21 184 lb 8 oz (83.7 kg)    Assessment: Review of patient past medical history, allergies, medications, health status, including review of consultants reports, laboratory and other test data, was performed as part of comprehensive evaluation and provision of chronic care management services.   SDOH:  (Social Determinants of Health) assessments and interventions performed:    CCM Care Plan  Allergies  Allergen Reactions   Augmentin [Amoxicillin-Pot Clavulanate]     Gi upset   Ciprofloxacin    Nsaids Other (See Comments)    Stomach ulcer     Medications Reviewed Today     Reviewed by Beryle Lathe, [facility] (Pharmacist) on 06/18/21 at [place] List Status: <None>   Medication Order Taking? Sig Documenting Provider Last Dose Status Informant  acetaminophen (TYLENOL) 500 MG tablet [id number] Yes Take 500 mg by mouth every 6 (six) hours as needed. [provider] Taking Active Self  albuterol (VENTOLIN HFA) 108 (90 Base) MCG/ACT inhaler [id number] Yes TAKE 2 PUFFS BY MOUTH EVERY 6 HOURS AS NEEDED FOR WHEEZE [name], Elayne Snare, MD Taking Active            Med Note Waldo Laine, Gwenyth Allegra   Fri Jan 12, 2021  9:12 AM) Uses 2 puffs daily  Ascorbic Acid (VITAMIN C) 100 MG tablet [id number] Yes Take 100 mg by mouth daily. [provider] Taking Active   busPIRone (BUSPAR) 10 MG tablet [id number] Yes Take 1 tablet by mouth twice daily [name], Scott A, MD Taking Active   co-enzyme Q-10 30 MG capsule [id number] Yes Take 30 mg by mouth daily. [provider] Taking Active Self  ezetimibe (ZETIA) 10 MG tablet [id number] Yes Take 1 tablet (10 mg total) by mouth daily. [name], MD Taking Active   ferrous sulfate  325 (65 FE) MG tablet [id number] Yes Take 325 mg by mouth daily with breakfast. [provider] Taking Active   fexofenadine (ALLEGRA) 180 MG tablet [id number] Yes Take 180 mg by mouth daily. [provider] Taking Active Self  FIBER PO [id number] Yes Take 1 tablet by mouth daily. [provider] Taking Active   fluticasone furoate-vilanterol (BREO ELLIPTA) 200-25 MCG/INH AEPB [id number] Yes 1 puff q am daily [name], MD Taking Active            Med Note Beryle Lathe   Fri Feb 02, 2021  4:15 PM)    levothyroxine (SYNTHROID) 50  MCG tablet [id number] Yes Take 1 1/2 tablets on M W Fri Sat Sun and take 1 on Tues and Thursday [name], MD Taking Active   Melatonin 1 MG TABS [id number] Yes Take 5 mg by mouth at bedtime as needed. [provider] Taking Active   Multiple Vitamins-Minerals (MULTIVITAMIN WITH MINERALS) tablet [id number] Yes Take 1 tablet by mouth daily. [provider] Taking Active   nitroGLYCERIN (NITROSTAT) 0.4 MG SL tablet [id number] No Place 1 tablet (0.4 mg total) under the tongue every 5 (five) minutes as needed for chest pain. [name], MD Unknown Active   Omega-3 Fatty Acids (FISH OIL) 1000 MG CAPS [id number] Yes Take by mouth. [provider] Taking Active   Red Yeast Rice Extract 300 MG CAPS [id number] Yes Take 1 tablet by mouth daily. [provider] Taking Active Self  triamcinolone (KENALOG) 0.1 % [id number] Yes Apply 1 application topically 2 (two) times daily. [name], MD Taking Active   Med List Note Carmelina Noun, LPN 79/89/21 1941): -            Patient Active Problem List   Diagnosis Date Noted   Chronic bilateral low back pain without sciatica 08/22/2020   Malignant neoplasm of prostate ([place]) 06/30/2020   Hypothyroidism 03/16/2019   GERD (gastroesophageal reflux disease) 11/29/2015   Insomnia 11/29/2015   Prostate cancer ([place]) 12/30/2014   Hyperlipidemia  12/30/2014   Anemia 07/19/2013   Iron deficiency 07/19/2013   LUQ abdominal pain 07/19/2013   Rectal bleeding 07/19/2013    Immunization History  Administered Date(s) Administered   Fluad Quad(high Dose 65+) 02/01/2020   Influenza-Unspecified 02/03/2021   PFIZER(Purple Top)SARS-COV-2 Vaccination 05/15/2019, 06/07/2019, 02/03/2021   Pneumococcal Conjugate-13 04/26/2015   Pneumococcal Polysaccharide-23 03/09/2014    Conditions to be addressed/monitored: HLD, Anxiety, Depression, Cancer and shortness of breath   Care Plan : Medication Management  Updates made by Beryle Lathe, [place] since 06/18/2021 12:00 AM     Problem: Prostate Cancer, Hyperlipidemia, Shortness of Breath, and Anxiety/Depression   Priority: High  Onset Date: 01/12/2021     Long-Range Goal: Disease Progression Prevention   Start Date: 01/12/2021  Expected End Date: 04/12/2021  Recent Progress: On track  Priority: High  Note:   Current Barriers:  Unable to independently monitor therapeutic efficacy Unable to achieve control of hyperlipidemia Suboptimal therapeutic regimen for hyperlipidemia  Pharmacist Clinical Goal(s):  Through collaboration with PharmD and provider, patient will:  Achieve adherence to monitoring guidelines and medication adherence to achieve therapeutic efficacy Achieve control of hyperlipidemia as evidenced by improved LDL, improved triglycerides, and improved shortness of breath Adhere to plan to optimize therapeutic regimen for hyperlipidemia as evidenced by report of adherence to recommended medication management changes   Interventions: 1:1 collaboration with [name], MD regarding development and update of comprehensive plan of care as evidenced by provider attestation and co-signature Inter-disciplinary care team collaboration (see longitudinal plan of care) Comprehensive medication review performed; medication list updated in electronic medical record  Hyperlipidemia  - Goal on track (progressing) - YES: Uncontrolled but slightly improved with addition of ezetimibe. LDL decreased from 193 to 141 but remains above goal of <70 due to very high risk given 10-year risk >20% per 2020 AACE/ACE guidelines. Triglycerides now above goal of <150 per 2020 AACE/ACE guidelines. Current medications: ezetimibe 10 mg by mouth once daily, red yeast rice, fish oil, and CoQ10 Intolerances: none, but patient adamantly decline statins Taking medications as directed: yes Side effects thought  to be attributed to current medication regimen: no Encourage dietary reduction of high fat containing foods such as butter, nuts, bacon, egg yolks, etc. Recommend regular aerobic exercise. Patient plans to join [employer] program today.  Discussed with patient that his cholesterol remains well above goal. Discussed trial of statin vs addition of PCSK9i vs no change in current regimen. Also considered switch to Nexlizet, but this is not covered by his insurance. Patient reports fear of needles (not willing to inject PCSK9i) and continues to decline any form of statin therapy. Patient prefers to continue current therapy at this time and is aware of risks associated with elevated cholesterol.  Continue ezetimibe 10 mg by mouth once daily  Shortness of breath (unclear if formally diagnosed with asthma or chronic obstructive pulmonary disease) - condition stable - not address at this visit: Controlled with improved inhaler compliance Patient reports history of smoking best quit in 1984 Current treatment: albuterol metered dose (ProAir, Ventolin, Proventil) 1 puff by mouth twice daily and fluticasone furoate/vilanterol (Breo Ellipta) 200-25 mcg 1 puff by mouth once daily Reminded patient to rinse mouth with water and spit after using ICS containing inhaler to prevent thrush Continue current inhaler regimen  Prostate cancer (Followed by Dr. Janese Banks)- condition stable - not address at this  visit: Controlled. PSA levels responding well Current medications: Eligard 45 mg subcutaneously every 6 months  Anxiety/Depression- condition stable - not address at this visit: Controlled per patient Current medications: buspirone 10 mg by mouth twice daily Offered support from [place]; however, patient previously declined  Patient Goals/Self-Care Activities Patient will:  Take medications as prescribed Collaborate with provider on medication access solutions Target a minimum of 150 minutes of moderate intensity exercise weekly Engage in dietary modifications by decreased fat intake  Follow Up Plan: Telephone follow up appointment with care management team member scheduled for: 08/17/21      Medication Assistance: None required.  Patient affirms current coverage meets needs.  Patient's preferred pharmacy is:  [facility] [address] [place] - [place] [address] [place] [address] [place] [address] Phone: [phone] Fax: [phone]  Follow Up:  Patient agrees to Care Plan and Follow-up.  Plan: Telephone follow up appointment with care management team member scheduled for:  08/17/21  [name], PharmD, BCACP, CPP Clinical Pharmacist Practitioner [place] [phone]

## 2021-06-18 NOTE — Patient Instructions (Signed)
Thomas [name],  It was great to talk to you today!  Please call me with any questions or concerns.  Visit Information  Following are the goals we discussed today:   Goals Addressed             This Visit's Progress    Medication Management       Patient Goals/Self-Care Activities Patient will:  Take medications as prescribed Collaborate with provider on medication access solutions Target a minimum of 150 minutes of moderate intensity exercise weekly Engage in dietary modifications by decreased fat intake          Follow-up plan: Telephone follow up appointment with care management team member scheduled for:  08/17/21  Patient verbalizes understanding of instructions and care plan provided today and agrees to view in [place]. Active MyChart status confirmed with patient.    Please call the care guide team at [phone] if you need to cancel or reschedule your appointment.   [name], PharmD, [place], CPP Clinical Pharmacist Practitioner [place] [phone]

## 2021-06-18 NOTE — Telephone Encounter (Signed)
Prescription sent electronically to pharmacy. Patient notified. 

## 2021-06-18 NOTE — Telephone Encounter (Signed)
Protonix 40 mg 1 daily, #30, 1 refill, follow-up if any ongoing troubles

## 2021-06-18 NOTE — Telephone Encounter (Signed)
Patient requesting a script for Protonix. Patient has taken it in the past and states he has been having a flare of reflux

## 2021-06-19 DIAGNOSIS — C61 Malignant neoplasm of prostate: Secondary | ICD-10-CM

## 2021-06-19 DIAGNOSIS — F321 Major depressive disorder, single episode, moderate: Secondary | ICD-10-CM | POA: Diagnosis not present

## 2021-06-19 DIAGNOSIS — E782 Mixed hyperlipidemia: Secondary | ICD-10-CM

## 2021-06-19 DIAGNOSIS — R69 Illness, unspecified: Secondary | ICD-10-CM | POA: Diagnosis not present

## 2021-07-09 ENCOUNTER — Other Ambulatory Visit: Payer: Self-pay

## 2021-07-09 ENCOUNTER — Inpatient Hospital Stay (HOSPITAL_BASED_OUTPATIENT_CLINIC_OR_DEPARTMENT_OTHER): Payer: Medicare HMO | Admitting: Oncology

## 2021-07-09 ENCOUNTER — Inpatient Hospital Stay: Payer: Medicare HMO | Attending: Oncology

## 2021-07-09 ENCOUNTER — Encounter: Payer: Self-pay | Admitting: Oncology

## 2021-07-09 ENCOUNTER — Inpatient Hospital Stay: Payer: Medicare HMO

## 2021-07-09 VITALS — BP 138/79 | HR 86 | Temp 98.0°F | Ht 63.0 in | Wt 183.5 lb

## 2021-07-09 DIAGNOSIS — Z191 Hormone sensitive malignancy status: Secondary | ICD-10-CM | POA: Insufficient documentation

## 2021-07-09 DIAGNOSIS — E785 Hyperlipidemia, unspecified: Secondary | ICD-10-CM | POA: Insufficient documentation

## 2021-07-09 DIAGNOSIS — Z79818 Long term (current) use of other agents affecting estrogen receptors and estrogen levels: Secondary | ICD-10-CM

## 2021-07-09 DIAGNOSIS — Z79899 Other long term (current) drug therapy: Secondary | ICD-10-CM | POA: Insufficient documentation

## 2021-07-09 DIAGNOSIS — C61 Malignant neoplasm of prostate: Secondary | ICD-10-CM | POA: Insufficient documentation

## 2021-07-09 DIAGNOSIS — K219 Gastro-esophageal reflux disease without esophagitis: Secondary | ICD-10-CM | POA: Diagnosis not present

## 2021-07-09 DIAGNOSIS — E039 Hypothyroidism, unspecified: Secondary | ICD-10-CM | POA: Insufficient documentation

## 2021-07-09 DIAGNOSIS — Z7951 Long term (current) use of inhaled steroids: Secondary | ICD-10-CM | POA: Insufficient documentation

## 2021-07-09 DIAGNOSIS — Z5181 Encounter for therapeutic drug level monitoring: Secondary | ICD-10-CM

## 2021-07-09 DIAGNOSIS — J45909 Unspecified asthma, uncomplicated: Secondary | ICD-10-CM | POA: Insufficient documentation

## 2021-07-09 DIAGNOSIS — R5383 Other fatigue: Secondary | ICD-10-CM | POA: Diagnosis not present

## 2021-07-09 LAB — COMPREHENSIVE METABOLIC PANEL
ALT: 19 U/L (ref 0–44)
AST: 22 U/L (ref 15–41)
Albumin: 3.9 g/dL (ref 3.5–5.0)
Alkaline Phosphatase: 66 U/L (ref 38–126)
Anion gap: 7 (ref 5–15)
BUN: 15 mg/dL (ref 8–23)
CO2: 28 mmol/L (ref 22–32)
Calcium: 8.7 mg/dL — ABNORMAL LOW (ref 8.9–10.3)
Chloride: 107 mmol/L (ref 98–111)
Creatinine, Ser: 0.78 mg/dL (ref 0.61–1.24)
GFR, Estimated: >60 mL/min (ref >60)
Glucose, Bld: 94 mg/dL (ref 70–99)
Potassium: 3.6 mmol/L (ref 3.5–5.1)
Sodium: 142 mmol/L (ref 135–145)
Total Bilirubin: 0.3 mg/dL (ref 0.3–1.2)
Total Protein: 6.8 g/dL (ref 6.5–8.1)

## 2021-07-09 LAB — CBC WITH DIFFERENTIAL/PLATELET
Abs Immature Granulocytes: 0.02 10*3/uL (ref 0.00–0.07)
Basophils Absolute: 0 10*3/uL (ref 0.0–0.1)
Basophils Relative: 1 %
Eosinophils Absolute: 0.5 10*3/uL (ref 0.0–0.5)
Eosinophils Relative: 10 %
HCT: 41.3 % (ref 39.0–52.0)
Hemoglobin: 14.3 g/dL (ref 13.0–17.0)
Immature Granulocytes: 0 %
Lymphocytes Relative: 26 %
Lymphs Abs: 1.3 10*3/uL (ref 0.7–4.0)
MCH: 33.3 pg (ref 26.0–34.0)
MCHC: 34.6 g/dL (ref 30.0–36.0)
MCV: 96 fL (ref 80.0–100.0)
Monocytes Absolute: 0.4 10*3/uL (ref 0.1–1.0)
Monocytes Relative: 8 %
Neutro Abs: 2.7 10*3/uL (ref 1.7–7.7)
Neutrophils Relative %: 55 %
Platelets: 189 10*3/uL (ref 150–400)
RBC: 4.3 MIL/uL (ref 4.22–5.81)
RDW: 12.2 % (ref 11.5–15.5)
WBC: 4.9 10*3/uL (ref 4.0–10.5)
nRBC: 0 % (ref 0.0–0.2)

## 2021-07-09 LAB — PSA: Prostatic Specific Antigen: 0.27 ng/mL (ref 0.00–4.00)

## 2021-07-09 MED ORDER — LEUPROLIDE ACETATE (6 MONTH) 45 MG ~~LOC~~ KIT: 45.0000 mg | PACK | SUBCUTANEOUS | Status: DC

## 2021-07-09 MED ORDER — LEUPROLIDE ACETATE (6 MONTH) 45 MG ~~LOC~~ KIT
45.0000 mg | PACK | Freq: Once | SUBCUTANEOUS | Status: AC
  Administered 2021-07-09: 45 mg via SUBCUTANEOUS
  Filled 2021-07-09: qty 45
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
## 2021-07-09 NOTE — Progress Notes (Signed)
? ? ? ?Hematology/Oncology Consult note ?[place]  ?[phone] [phone] ? ?Patient Care Team: ?[name], MD as PCP - General (Family Medicine) ?Beryle Lathe, [facility] (Pharmacist)  ? ?Name of the patient: Thomas [name]  ?[id number]  ?01/31/1946  ? ?Date of visit: 07/09/21 ? 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  ? ?Chief complaint/ Reason for visit-routine follow-up of prostate cancer for next dose of Lupron ? ?Heme/Onc history:  patient is a 76 year old male who was diagnosed with Gleason's 6 adenocarcinoma of the prostate about 7 years ago.  I do not have the biopsy report from 7 years ago.  He received I-125 interstitial implant for the same.  His presenting PSA at that time was 8.7.  He was last given a dose of Eligard in March 2021 following which his PSA dropped down from 5.28-0.27.  He did not receive any Eligard since then and PSA went back up to 4.7 about 2 weeks ago.  He has been referred to medical oncology for consideration of restarting ADT. ?  ?Lupron restarted in March 2022 ?  ? ?Interval history-tolerating Lupron/Eligard well without any significant side effects.  Denies any hot flashes.  Has chronic fatigue.  Appetite and weight have remained stable. ? ?ECOG PS- 1 ?Pain scale- 0 ? ?Review of systems- Review of Systems  ?Constitutional:  Positive for malaise/fatigue. Negative for chills, fever and weight loss.  ?HENT:  Negative for congestion, ear discharge and nosebleeds.   ?Eyes:  Negative for blurred vision.  ?Respiratory:  Negative for cough, hemoptysis, sputum production, shortness of breath and wheezing.   ?Cardiovascular:  Negative for chest pain, palpitations, orthopnea and claudication.  ?Gastrointestinal:  Negative for abdominal pain, blood in stool, constipation, diarrhea, heartburn, melena, nausea and vomiting.  ?Genitourinary:  Negative for dysuria, flank pain, frequency, hematuria and urgency.   ?Musculoskeletal:  Negative for back pain, joint pain and myalgias.  ?Skin:  Negative for rash.  ?Neurological:  Negative for dizziness, tingling, focal weakness, seizures, weakness and headaches.  ?Endo/Heme/Allergies:  Does not bruise/bleed easily.  ?Psychiatric/Behavioral:  Negative for depression and suicidal ideas. The patient does not have insomnia.    ? ? ?Allergies  ?Allergen Reactions  ? Augmentin [Amoxicillin-Pot Clavulanate]   ?  Gi upset  ? Ciprofloxacin   ? Nsaids Other (See Comments)  ?  Stomach ulcer   ? ? ? ?Past Medical History:  ?Diagnosis Date  ? Allergy   ? trees and plants  ? Anemia   ? IDA  ? Anxiety   ? Asthma 1998  ? Diverticulitis   ? Elevated PSA   ? GERD (gastroesophageal reflux disease)   ? Hyperlipidemia   ? Hypothyroidism 03/16/2019  ? Prostate cancer ([place])   ? ? ? ?Past Surgical History:  ?Procedure Laterality Date  ? HERNIA REPAIR  1999  ? prostate cancer    ? TRANSURETHRAL RESECTION OF PROSTATE    ? VASECTOMY    ? ? ?Social History  ? ?Socioeconomic History  ? Marital status: Married  ?  Spouse name: Not on file  ? Number of children: Not on file  ? Years of education: Not on file  ? Highest education level: Not on file  ?Occupational History  ? Not on file  ?Tobacco Use  ? Smoking status: Former  ?  Types: Cigarettes  ?  Quit date: 04/22/1982  ?  Years since quitting: 39.2  ? Smokeless tobacco: Never  ?Vaping Use  ? Vaping Use:  Never used  ?Substance and Sexual Activity  ? Alcohol use: Not Currently  ?  Comment: bourbon once every night  ? Drug use: Not Currently  ? Sexual activity: Not Currently  ?Other Topics Concern  ? Not on file  ?Social History Narrative  ? Not on file  ? ?Social Determinants of Health  ? ?Financial Resource Strain: Not on file  ?Food Insecurity: Not on file  ?Transportation Needs: Not on file  ?Physical Activity: Not on file  ?Stress: Not on file  ?Social Connections: Not on file  ?Intimate Partner Violence: Not on file  ? ? ?Family History  ?Problem Relation  Age of Onset  ? Heart disease Father   ? Hypertension Brother   ? Diabetes Brother   ? ? ? ?Current Outpatient Medications:  ?  acetaminophen (TYLENOL) 500 MG tablet, Take 500 mg by mouth every 6 (six) hours as needed., Disp: , Rfl:  ?  albuterol (VENTOLIN HFA) 108 (90 Base) MCG/ACT inhaler, TAKE 2 PUFFS BY MOUTH EVERY 6 HOURS AS NEEDED FOR WHEEZE, Disp: 18 g, Rfl: 1 ?  Ascorbic Acid (VITAMIN C) 100 MG tablet, Take 100 mg by mouth daily., Disp: , Rfl:  ?  busPIRone (BUSPAR) 10 MG tablet, Take 1 tablet by mouth twice daily, Disp: 60 tablet, Rfl: 0 ?  co-enzyme Q-10 30 MG capsule, Take 30 mg by mouth daily., Disp: , Rfl:  ?  ezetimibe (ZETIA) 10 MG tablet, Take 1 tablet (10 mg total) by mouth daily., Disp: 30 tablet, Rfl: 5 ?  ferrous sulfate 325 (65 FE) MG tablet, Take 325 mg by mouth daily with breakfast., Disp: , Rfl:  ?  fexofenadine (ALLEGRA) 180 MG tablet, Take 180 mg by mouth daily., Disp: , Rfl:  ?  FIBER PO, Take 1 tablet by mouth daily., Disp: , Rfl:  ?  fluticasone furoate-vilanterol (BREO ELLIPTA) 200-25 MCG/INH AEPB, 1 puff q am daily, Disp: 1 each, Rfl: 11 ?  levothyroxine (SYNTHROID) 50 MCG tablet, Take 1 1/2 tablets on M W Fri Sat Sun and take 1 on Tues and Thursday, Disp: 120 tablet, Rfl: 5 ?  Melatonin 1 MG TABS, Take 5 mg by mouth at bedtime as needed., Disp: , Rfl:  ?  Multiple Vitamins-Minerals (MULTIVITAMIN WITH MINERALS) tablet, Take 1 tablet by mouth daily., Disp: , Rfl:  ?  nitroGLYCERIN (NITROSTAT) 0.4 MG SL tablet, Place 1 tablet (0.4 mg total) under the tongue every 5 (five) minutes as needed for chest pain., Disp: 30 tablet, Rfl: 5 ?  Omega-3 Fatty Acids (FISH OIL) 1000 MG CAPS, Take 1 capsule by mouth daily., Disp: , Rfl:  ?  pantoprazole (PROTONIX) 40 MG tablet, Take 1 tablet (40 mg total) by mouth daily., Disp: 30 tablet, Rfl: 1 ?  Red Yeast Rice Extract 300 MG CAPS, Take 1 tablet by mouth daily., Disp: , Rfl:  ?  triamcinolone (KENALOG) 0.1 %, Apply 1 application topically 2 (two)  times daily., Disp: 45 g, Rfl: 0 ?No current facility-administered medications for this visit. ? ?Facility-Administered Medications Ordered in Other Visits:  ?  leuprolide (6 Month) (ELIGARD) injection 45 mg, 45 mg, Subcutaneous, Q6 months, Sindy Guadeloupe, MD ? ?Physical exam:  ?Vitals:  ? 07/09/21 1027  ?BP: 138/79  ?Pulse: 86  ?Temp: 98 ?F (36.7 ?C)  ?TempSrc: Oral  ?Weight: 183 lb 8 oz (83.2 kg)  ?Height: '5\' 3"'$  (1.6 m)  ? ?Physical Exam ?Cardiovascular:  ?   Rate and Rhythm: Normal rate and regular rhythm.  ?  Heart sounds: Normal heart sounds.  ?Pulmonary:  ?   Effort: Pulmonary effort is normal.  ?   Breath sounds: Normal breath sounds.  ?Abdominal:  ?   General: Bowel sounds are normal.  ?   Palpations: Abdomen is soft.  ?Skin: ?   General: Skin is warm and dry.  ?Neurological:  ?   Mental Status: He is alert and oriented to person, place, and time.  ?  ? ?CMP Latest Ref Rng & Units 07/09/2021  ?Glucose 70 - 99 mg/dL 94  ?BUN 8 - 23 mg/dL 15  ?Creatinine 0.61 - 1.24 mg/dL 0.78  ?Sodium 135 - 145 mmol/L 142  ?Potassium 3.5 - 5.1 mmol/L 3.6  ?Chloride 98 - 111 mmol/L 107  ?CO2 22 - 32 mmol/L 28  ?Calcium 8.9 - 10.3 mg/dL 8.7(L)  ?Total Protein 6.5 - 8.1 g/dL 6.8  ?Total Bilirubin 0.3 - 1.2 mg/dL 0.3  ?Alkaline Phos 38 - 126 U/L 66  ?AST 15 - 41 U/L 22  ?ALT 0 - 44 U/L 19  ? ?CBC Latest Ref Rng & Units 07/09/2021  ?WBC 4.0 - 10.5 K/uL 4.9  ?Hemoglobin 13.0 - 17.0 g/dL 14.3  ?Hematocrit 39.0 - 52.0 % 41.3  ?Platelets 150 - 400 K/uL 189  ? ? ? ?Assessment and plan- Patient is a 76 y.o. male with castrate sensitive nonmetastatic prostate cancer currently on ADT here for routine follow-up ? ?Patient is getting Eligard every 6 months and will be due for his next dose today.  PSA from today is pending but overall PSA has been trending down.Labs otherwise unremarkable.  I will see him back in 6 months with CBC with differential CMP and PSA.  We will discuss bone density scan with him at next visit ?  ?Visit Diagnosis ?1.  Malignant neoplasm of prostate ([place])   ?2. Encounter for monitoring Lupron therapy   ? ? ? ?Dr. Randa Evens, MD, MPH ?[facility] ?[phone] ?07/09/2021 ?2:11 PM ? ? ? ? ? ? ?

## 2021-07-16 ENCOUNTER — Other Ambulatory Visit: Payer: Self-pay | Admitting: Family Medicine

## 2021-07-23 ENCOUNTER — Encounter: Payer: Self-pay | Admitting: Oncology

## 2021-07-27 ENCOUNTER — Ambulatory Visit: Payer: Medicare HMO | Admitting: Family Medicine

## 2021-07-30 ENCOUNTER — Ambulatory Visit: Payer: Medicare HMO | Admitting: Family Medicine

## 2021-08-15 ENCOUNTER — Telehealth: Payer: Medicare HMO

## 2021-08-15 ENCOUNTER — Telehealth: Payer: Self-pay | Admitting: *Deleted

## 2021-08-15 NOTE — Telephone Encounter (Signed)
?  Care Management  ? ?Follow Up Note ? ? ?08/15/2021 ?Name: Thomas [name] MRN: [id number] DOB: [birth year] ? ? ?Referred by: [name], MD ?Reason for referral : Chronic Care Management (HLD, Prostate Cancer) ? ? ?An unsuccessful telephone outreach was attempted today. The patient was referred to the case management team for assistance with care management and care coordination.  ? ?Follow Up Plan: Telephone follow up appointment with care management team member scheduled for:  upon care guide rescheduling. ? ?Jacqlyn Larsen RNC, BSN ?RN Case Manager ?[place] ?[phone] ? ? ?

## 2021-08-17 ENCOUNTER — Telehealth: Payer: Medicare HMO

## 2021-08-20 ENCOUNTER — Other Ambulatory Visit: Payer: Self-pay | Admitting: Family Medicine

## 2021-08-20 DIAGNOSIS — E782 Mixed hyperlipidemia: Secondary | ICD-10-CM | POA: Diagnosis not present

## 2021-08-20 DIAGNOSIS — E038 Other specified hypothyroidism: Secondary | ICD-10-CM | POA: Diagnosis not present

## 2021-08-21 LAB — LIPID PANEL
Chol/HDL Ratio: 3.1 ratio (ref 0.0–5.0)
Cholesterol, Total: 242 mg/dL — ABNORMAL HIGH (ref 100–199)
HDL: 78 mg/dL (ref >39)
LDL Chol Calc (NIH): 141 mg/dL — ABNORMAL HIGH (ref 0–99)
Triglycerides: 133 mg/dL (ref 0–149)
VLDL Cholesterol Cal: 23 mg/dL (ref 5–40)

## 2021-08-21 LAB — TSH: TSH: 2.49 u[IU]/mL (ref 0.450–4.500)

## 2021-08-22 ENCOUNTER — Ambulatory Visit (INDEPENDENT_AMBULATORY_CARE_PROVIDER_SITE_OTHER): Payer: No Typology Code available for payment source | Admitting: Family Medicine

## 2021-08-22 ENCOUNTER — Telehealth: Payer: Self-pay | Admitting: *Deleted

## 2021-08-22 VITALS — BP 112/72 | HR 89 | Ht 63.0 in | Wt 183.6 lb

## 2021-08-22 DIAGNOSIS — M791 Myalgia, unspecified site: Secondary | ICD-10-CM | POA: Diagnosis not present

## 2021-08-22 DIAGNOSIS — E038 Other specified hypothyroidism: Secondary | ICD-10-CM

## 2021-08-22 DIAGNOSIS — G8929 Other chronic pain: Secondary | ICD-10-CM

## 2021-08-22 DIAGNOSIS — K219 Gastro-esophageal reflux disease without esophagitis: Secondary | ICD-10-CM

## 2021-08-22 DIAGNOSIS — T466X5A Adverse effect of antihyperlipidemic and antiarteriosclerotic drugs, initial encounter: Secondary | ICD-10-CM

## 2021-08-22 DIAGNOSIS — E782 Mixed hyperlipidemia: Secondary | ICD-10-CM

## 2021-08-22 DIAGNOSIS — M542 Cervicalgia: Secondary | ICD-10-CM

## 2021-08-22 DIAGNOSIS — M545 Low back pain, unspecified: Secondary | ICD-10-CM | POA: Diagnosis not present

## 2021-08-22 MED ORDER — PANTOPRAZOLE SODIUM 40 MG PO TBEC: 40.0000 mg | DELAYED_RELEASE_TABLET | Freq: Every day | ORAL | 5 refills | Status: DC

## 2021-08-22 MED ORDER — FLUTICASONE PROPIONATE 50 MCG/ACT NA SUSP
2.0000 | Freq: Every day | NASAL | 4 refills | Status: DC
Start: 2021-08-22 — End: 2023-11-11

## 2021-08-22 MED ORDER — LEVOTHYROXINE SODIUM 50 MCG PO TABS: ORAL_TABLET | ORAL | 5 refills | Status: DC

## 2021-08-22 MED ORDER — EZETIMIBE 10 MG PO TABS: 10.0000 mg | ORAL_TABLET | Freq: Every day | ORAL | 5 refills | Status: DC

## 2021-08-22 NOTE — Telephone Encounter (Signed)
Loratadine 10 mg one daily available OTC ?May send in flonase, #1,4rf, 2 spray each nare daily ?

## 2021-08-22 NOTE — Telephone Encounter (Signed)
Patient stated he was seen this am at 8 am and mentioned in the visit he was having terrible pollen allergies but didn't get any medicines or recommendations on what he can take OTC for them ?

## 2021-08-22 NOTE — Patient Instructions (Addendum)
Results for orders placed or performed in visit on 08/20/21  ?TSH  ?Result Value Ref Range  ? TSH 2.490 0.450 - 4.500 uIU/mL  ? ?Hi Ronalee Belts ?It was good to see you today. ?Your cholesterol looks much better as I gave you the numbers.  Continue to eat healthy and do the best he can and staying healthy we will see you back later in the fall.  [facility]Care-Dr. Nicki Reaper. ?

## 2021-08-22 NOTE — Telephone Encounter (Signed)
Patient notified. Prescription sent electronically to pharmacy. ?

## 2021-08-22 NOTE — Progress Notes (Signed)
? ?  Subjective:  ? ? Patient ID: Thomas [name], male    DOB: Jun 02, 1945, 76 y.o.   MRN: [id number] ? ?Hyperlipidemia ?This is a chronic problem. Current antihyperlipidemic treatment includes diet change (zetia).  ? ?Patient reports back pain bothering him all the time and moving into his neck now ?Patient relates intermittent low back pain and discomfort with certain movements does not radiate down the legs he states he does try to do the best he can tolerating it this is been going on for years not getting worse ? ?Intermittent neck pain and discomfort with certain movements does not radiate into the shoulders or into the arms no tingling or numbness ? ?Tries to do the best he can and eating healthy.  Does take Zetia for cholesterol.  Patient has tried statins before did not tolerate ? ?Patient also states that the pollen is messing with his allergies ? ?Review of Systems ? ?   ?Objective:  ? Physical Exam ?General-in no acute distress ?Eyes-no discharge ?Lungs-respiratory rate normal, CTA ?CV-no murmurs,RRR ?Extremities skin warm dry no edema ?Neuro grossly normal ?Behavior normal, alert ? ? ? ? ?   ?Assessment & Plan:  ?1. Cervical pain ?I believe that this is more so related into degenerative arthritic changes of age no cervical impingement going on May use Tylenol as needed if it worsens we can always do imaging ? ?2. Other specified hypothyroidism ?TSH looks good continue thyroid medicine as is ? ?3. Gastroesophageal reflux disease, unspecified whether esophagitis present ?Patient denies any issue with his reflux currently ? ?4. Mixed hyperlipidemia ?Cholesterol profile is improved he does not tolerate statins ? ?5. Chronic bilateral low back pain without sciatica ?Lumbar pain, stretches, this is a chronic issue ? ?Recent PSA on the low end ? ?Patient states his moods are doing well ? ? ?

## 2021-08-30 ENCOUNTER — Encounter: Payer: Self-pay | Admitting: Oncology

## 2021-09-10 ENCOUNTER — Encounter: Payer: Self-pay | Admitting: Nurse Practitioner

## 2021-09-10 ENCOUNTER — Ambulatory Visit (INDEPENDENT_AMBULATORY_CARE_PROVIDER_SITE_OTHER): Payer: No Typology Code available for payment source | Admitting: Nurse Practitioner

## 2021-09-10 VITALS — BP 110/62 | HR 103 | Temp 97.7°F | Wt 180.0 lb

## 2021-09-10 DIAGNOSIS — C61 Malignant neoplasm of prostate: Secondary | ICD-10-CM

## 2021-09-10 DIAGNOSIS — D649 Anemia, unspecified: Secondary | ICD-10-CM | POA: Diagnosis not present

## 2021-09-10 NOTE — Progress Notes (Signed)
   Subjective:    Patient ID: Thomas [name], male    DOB: [birth year], 76 y.o.   MRN: [id number]  HPI Pt found 2 knots on left side of chest area. Pt has had history of prostate cancer and is concerned cancer may have spread. Pt found the knots about one week ago. Not painful but tender when touched. Pt also having neck pain that has been going on about 2 months. Pt believes it may be due to back pain.  However patient states that today both the night to his chest into his neck are gone.  Patient denies any weight loss headaches, night sweats, swelling, chest pain, changes to his appetite.  Review of Systems  All other systems reviewed and are negative.     Objective:   Physical Exam Vitals reviewed.  Constitutional:      General: He is not in acute distress.    Appearance: Normal appearance. He is obese. He is not ill-appearing or diaphoretic.  HENT:     Head: Normocephalic and atraumatic.  Cardiovascular:     Rate and Rhythm: Normal rate and regular rhythm.     Pulses: Normal pulses.     Heart sounds: Normal heart sounds.  Pulmonary:     Effort: Pulmonary effort is normal. No respiratory distress.     Breath sounds: Normal breath sounds. No stridor. No wheezing, rhonchi or rales.  Chest:     Chest wall: No tenderness.  Musculoskeletal:     Cervical back: Normal range of motion and neck supple. No rigidity or tenderness.     Comments: Walks with a cane  Lymphadenopathy:     Cervical: No cervical adenopathy.  Skin:    General: Skin is warm.     Capillary Refill: Capillary refill takes less than 2 seconds.  Neurological:     Mental Status: He is alert.     Comments: Grossly intact  Psychiatric:        Mood and Affect: Mood normal.        Behavior: Behavior normal.          Assessment & Plan:     1. Prostate cancer [facility]) -Patient is concerned due to his history of prostate cancer that these 2 nights that are not present today on exam were related to  cancer. -Due to the fact that the areas are no longer there I do not suspect cancer or any type of metastases. -However will get a CBC to look for leukocytosis - CBC with Differential -Follow-up with Dr. Nicki Reaper in 2 months or sooner if needed    Note:  This document was prepared using Dragon voice recognition software and may include unintentional dictation errors. Note - This record has been created using [employer].  Chart creation errors have been sought, but may not always  have been located. Such creation errors do not reflect on  the standard of medical care.

## 2021-09-19 ENCOUNTER — Ambulatory Visit (INDEPENDENT_AMBULATORY_CARE_PROVIDER_SITE_OTHER): Payer: No Typology Code available for payment source | Admitting: *Deleted

## 2021-09-19 DIAGNOSIS — C61 Malignant neoplasm of prostate: Secondary | ICD-10-CM

## 2021-09-19 DIAGNOSIS — E782 Mixed hyperlipidemia: Secondary | ICD-10-CM

## 2021-09-19 NOTE — Chronic Care Management (AMB) (Signed)
Chronic Care Management   CCM RN Visit Note  09/19/2021 Name: Thomas [name] MRN: [id number] DOB: 02/14/1946  Subjective: Thomas [name] is a 76 y.o. year old male who is a primary care patient of [name], Scott A, MD. The care management team was consulted for assistance with disease management and care coordination needs.    Engaged with patient by telephone for initial visit in response to provider referral for case management and/or care coordination services.   Consent to Services:  The patient was given the following information about Chronic Care Management services today, agreed to services, and gave verbal consent: 1. CCM service includes personalized support from designated clinical staff supervised by the primary care provider, including individualized plan of care and coordination with other care providers 2. 24/7 contact phone numbers for assistance for urgent and routine care needs. 3. Service will only be billed when office clinical staff spend 20 minutes or more in a month to coordinate care. 4. Only one practitioner may furnish and bill the service in a calendar month. 5.The patient may stop CCM services at any time (effective at the end of the month) by phone call to the office staff. 6. The patient will be responsible for cost sharing (co-pay) of up to 20% of the service fee (after annual deductible is met). Patient agreed to services and consent obtained.  Patient agreed to services and verbal consent obtained.   Assessment: Review of patient past medical history, allergies, medications, health status, including review of consultants reports, laboratory and other test data, was performed as part of comprehensive evaluation and provision of chronic care management services.   SDOH (Social Determinants of Health) assessments and interventions performed:  SDOH Interventions    Flowsheet Row Most Recent Value  SDOH Interventions   Food Insecurity Interventions  Intervention Not Indicated  Housing Interventions Intervention Not Indicated  Transportation Interventions Intervention Not Indicated  Depression Interventions/Treatment  Patient refuses Treatment        CCM Care Plan  Allergies  Allergen Reactions   Augmentin [Amoxicillin-Pot Clavulanate]     Gi upset   Ciprofloxacin    Nsaids Other (See Comments)    Stomach ulcer    Rosuvastatin     Myalgias    Outpatient Encounter Medications as of 09/19/2021  Medication Sig Note   acetaminophen (TYLENOL) 500 MG tablet Take 500 mg by mouth every 6 (six) hours as needed.    albuterol (VENTOLIN HFA) 108 (90 Base) MCG/ACT inhaler TAKE 2 PUFFS BY MOUTH EVERY 6 HOURS AS NEEDED FOR WHEEZE 01/12/2021: Uses 2 puffs daily   Ascorbic Acid (VITAMIN C) 100 MG tablet Take 100 mg by mouth daily.    busPIRone (BUSPAR) 10 MG tablet Take 1 tablet by mouth twice daily    co-enzyme Q-10 30 MG capsule Take 30 mg by mouth daily.    ezetimibe (ZETIA) 10 MG tablet Take 1 tablet (10 mg total) by mouth daily.    ferrous sulfate 325 (65 FE) MG tablet Take 325 mg by mouth daily with breakfast.    fexofenadine (ALLEGRA) 180 MG tablet Take 180 mg by mouth daily.    FIBER PO Take 1 tablet by mouth daily.    fluticasone (FLONASE) 50 MCG/ACT nasal spray Place 2 sprays into both nostrils daily.    fluticasone furoate-vilanterol (BREO ELLIPTA) 200-25 MCG/INH AEPB 1 puff q am daily    levothyroxine (SYNTHROID) 50 MCG tablet Take 1 1/2 tablets on M W Fri Sat Sun and take 1 on Tues   and Thursday    Melatonin 1 MG TABS Take 5 mg by mouth at bedtime as needed.    Multiple Vitamins-Minerals (MULTIVITAMIN WITH MINERALS) tablet Take 1 tablet by mouth daily.    nitroGLYCERIN (NITROSTAT) 0.4 MG SL tablet Place 1 tablet (0.4 mg total) under the tongue every 5 (five) minutes as needed for chest pain.    Omega-3 Fatty Acids (FISH OIL) 1000 MG CAPS Take 1 capsule by mouth daily.    pantoprazole (PROTONIX) 40 MG tablet Take 1 tablet (40 mg  total) by mouth daily.    Red Yeast Rice Extract 300 MG CAPS Take 1 tablet by mouth daily.    triamcinolone (KENALOG) 0.1 % Apply 1 application topically 2 (two) times daily.    No facility-administered encounter medications on file as of 09/19/2021.    Patient Active Problem List   Diagnosis Date Noted   Myalgia due to statin 08/22/2021   Chronic bilateral low back pain without sciatica 08/22/2020   Malignant neoplasm of prostate ([place]) 06/30/2020   Hypothyroidism 03/16/2019   GERD (gastroesophageal reflux disease) 11/29/2015   Insomnia 11/29/2015   Prostate cancer ([place]) 12/30/2014   Hyperlipidemia 12/30/2014   Anemia 07/19/2013   Iron deficiency 07/19/2013   LUQ abdominal pain 07/19/2013   Rectal bleeding 07/19/2013    Conditions to be addressed/monitored:HLD and Prostate cancer  Care Plan : RN Care Manager Plan of Care  Updates made by Kassie Mends, RN since 09/19/2021 12:00 AM     Problem: No plan of care established for management of chronic disease state  (HLD, Prostate cancer)   Priority: High     Long-Range Goal: Development of plan of care for chronic disease management (HLD, Prostate cancer)   Start Date: 09/19/2021  Expected End Date: 03/18/2022  Priority: High  Note:   Current Barriers:  Knowledge Deficits related to plan of care for management of HLD and Prostate cancer  Patient reports he lives alone, has sister he can call on if needed, continues to drive, is independent in all aspects of his care.  Patient reports he tries to eat healthy and for exercise works around his house and yard. Patient reports he receives Eligard injection q 6 months for management of prostate cancer.  RNCM Clinical Goal(s):  Patient will verbalize understanding of plan for management of HLD and Prostate cancer as evidenced by patient report, review of EHR and  through collaboration with RN Care manager, provider, and care team.   Interventions: 1:1 collaboration with primary  care provider regarding development and update of comprehensive plan of care as evidenced by provider attestation and co-signature Inter-disciplinary care team collaboration (see longitudinal plan of care) Evaluation of current treatment plan related to  self management and patient's adherence to plan as established by provider   Oncology:  (Status: New goal. and Goal on track:  Yes.) Long Term Goal Assessment of understanding of oncology diagnosis:  Assessed patient understanding of cancer diagnosis and recommended treatment plan, Reviewed upcoming provider appointments and treatment appointments, Assessed available transportation to appointments and treatments. Has consistent/reliable transportation: Yes, Assessed support system. Has consistent/reliable family or other support: Yes, and PHQ2/PHQ9 performed Education sent via My Chart- cancer  Pain assessment completed  Hyperlipidemia:  (Status: New goal. Goal on Track (progressing): YES.) Long Term Goal  Lab Results  Component Value Date   CHOL 242 (H) 08/20/2021   HDL 78 08/20/2021   LDLCALC 141 (H) 08/20/2021   TRIG 133 08/20/2021   CHOLHDL 3.1 08/20/2021  Medication review performed; medication list updated in electronic medical record.  Provider established cholesterol goals reviewed; Counseled on importance of regular laboratory monitoring as prescribed; Provided HLD educational materials; Reviewed importance of limiting foods high in cholesterol; Reviewed exercise goals and target of 150 minutes per week; Screening for signs and symptoms of depression related to chronic disease state;  Assessed social determinant of health barriers;  Reviewed safety precautions  Patient Goals/Self-Care Activities: Take medications as prescribed   Attend all scheduled provider appointments Call pharmacy for medication refills 3-7 days in advance of running out of medications Attend church or other social activities Perform all self care  activities independently  Perform IADL's (shopping, preparing meals, housekeeping, managing finances) independently Call provider office for new concerns or questions  - take all medications exactly as prescribed - call doctor with any symptoms you believe are related to your medicine - call doctor when you experience any new symptoms - go to all doctor appointments as scheduled - adhere to prescribed diet: heart healthy Look over education sent via My Chart- heart healthy diet and cancer fall prevention strategies: change position slowly, use assistive device such as walker or cane (per provider recommendations) when walking, keep walkways clear, have good lighting in room. It is important to contact your provider if you have any falls, maintain muscle strength/tone by exercise per provider recommendations.       Plan:Telephone follow up appointment with care management team member scheduled for:  11/28/21  Jacqlyn Larsen [facility], BSN RN Case Manager [place] [phone]

## 2021-09-19 NOTE — Patient Instructions (Signed)
Visit Information   Thank you for taking time to visit with me today. Please don't hesitate to contact me if I can be of assistance to you before our next scheduled telephone appointment.  Following are the goals we discussed today:  Take medications as prescribed   Attend all scheduled provider appointments Call pharmacy for medication refills 3-7 days in advance of running out of medications Attend church or other social activities Perform all self care activities independently  Perform IADL's (shopping, preparing meals, housekeeping, managing finances) independently Call provider office for new concerns or questions  take all medications exactly as prescribed call doctor with any symptoms you believe are related to your medicine call doctor when you experience any new symptoms go to all doctor appointments as scheduled adhere to prescribed diet: heart healthy Look over education sent via My Chart- heart healthy diet and cancer fall prevention strategies: change position slowly, use assistive device such as walker or cane (per provider recommendations) when walking, keep walkways clear, have good lighting in room. It is important to contact your provider if you have any falls, maintain muscle strength/tone by exercise per provider recommendations.  Our next appointment is by telephone on 11/28/21 at 215 pm  Please call the care guide team at [phone] if you need to cancel or reschedule your appointment.   If you are experiencing a Mental Health or [place] or need someone to talk to, please call the Suicide and Crisis Lifeline: 988 call the [place] National Suicide Prevention Lifeline: (859)658-6687 or TTY: 785-461-0347 TTY (805)017-9806) to talk to a trained counselor call 1-800-273-TALK (toll free, 24 hour hotline) go to [facility] [place] [phone]) call the [place]: [phone] call  911   Following is a copy of your full care plan:  Care Plan : RN Care Manager Plan of Care  Updates made by Kassie Mends, RN since 09/19/2021 12:00 AM     Problem: No plan of care established for management of chronic disease state  (HLD, Prostate cancer)   Priority: High     Long-Range Goal: Development of plan of care for chronic disease management (HLD, Prostate cancer)   Start Date: 09/19/2021  Expected End Date: 03/18/2022  Priority: High  Note:   Current Barriers:  Knowledge Deficits related to plan of care for management of HLD and Prostate cancer  Patient reports he lives alone, has sister he can call on if needed, continues to drive, is independent in all aspects of his care.  Patient reports he tries to eat healthy and for exercise works around his house and yard. Patient reports he receives Eligard injection q 6 months for management of prostate cancer.  RNCM Clinical Goal(s):  Patient will verbalize understanding of plan for management of HLD and Prostate cancer as evidenced by patient report, review of EHR and  through collaboration with RN Care manager, provider, and care team.   Interventions: 1:1 collaboration with primary care provider regarding development and update of comprehensive plan of care as evidenced by provider attestation and co-signature Inter-disciplinary care team collaboration (see longitudinal plan of care) Evaluation of current treatment plan related to  self management and patient's adherence to plan as established by provider   Oncology:  (Status: New goal. and Goal on track:  Yes.) Long Term Goal Assessment of understanding of oncology diagnosis:  Assessed patient understanding of cancer diagnosis and recommended treatment plan, Reviewed upcoming provider appointments and treatment appointments, Assessed  available transportation to appointments and treatments. Has consistent/reliable transportation: Yes, Assessed support system. Has  consistent/reliable family or other support: Yes, and PHQ2/PHQ9 performed Education sent via My Chart- cancer  Pain assessment completed  Hyperlipidemia:  (Status: New goal. Goal on Track (progressing): YES.) Long Term Goal  Lab Results  Component Value Date   CHOL 242 (H) 08/20/2021   HDL 78 08/20/2021   LDLCALC 141 (H) 08/20/2021   TRIG 133 08/20/2021   CHOLHDL 3.1 08/20/2021     Medication review performed; medication list updated in electronic medical record.  Provider established cholesterol goals reviewed; Counseled on importance of regular laboratory monitoring as prescribed; Provided HLD educational materials; Reviewed importance of limiting foods high in cholesterol; Reviewed exercise goals and target of 150 minutes per week; Screening for signs and symptoms of depression related to chronic disease state;  Assessed social determinant of health barriers;  Reviewed safety precautions  Patient Goals/Self-Care Activities: Take medications as prescribed   Attend all scheduled provider appointments Call pharmacy for medication refills 3-7 days in advance of running out of medications Attend church or other social activities Perform all self care activities independently  Perform IADL's (shopping, preparing meals, housekeeping, managing finances) independently Call provider office for new concerns or questions  - take all medications exactly as prescribed - call doctor with any symptoms you believe are related to your medicine - call doctor when you experience any new symptoms - go to all doctor appointments as scheduled - adhere to prescribed diet: heart healthy Look over education sent via My Chart- heart healthy diet and cancer fall prevention strategies: change position slowly, use assistive device such as walker or cane (per provider recommendations) when walking, keep walkways clear, have good lighting in room. It is important to contact your provider if you have any  falls, maintain muscle strength/tone by exercise per provider recommendations.       Consent to CCM Services: Mr. Touchet was given information about Chronic Care Management services including:  CCM service includes personalized support from designated clinical staff supervised by his physician, including individualized plan of care and coordination with other care providers 24/7 contact phone numbers for assistance for urgent and routine care needs. Service will only be billed when office clinical staff spend 20 minutes or more in a month to coordinate care. Only one practitioner may furnish and bill the service in a calendar month. The patient may stop CCM services at any time (effective at the end of the month) by phone call to the office staff. The patient will be responsible for cost sharing (co-pay) of up to 20% of the service fee (after annual deductible is met).  Patient agreed to services and verbal consent obtained.   Patient verbalizes understanding of instructions and care plan provided today and agrees to view in [place]. Active MyChart status and patient understanding of how to access instructions and care plan via MyChart confirmed with patient.     Telephone follow up appointment with care management team member scheduled for:   Prostate Cancer  The prostate is a small gland that produces fluid that makes up semen (seminal fluid). It is located below the bladder in men, in front of the rectum. Prostate cancer is the abnormal growth of cells in the prostate gland. What are the causes? The exact cause of this condition is not known. What increases the risk? You are more likely to develop this condition if: You are 81 years of age or older. You have a family  history of prostate cancer. You have a family history of breast and ovarian cancer. You have genes that are passed from parent to child (inherited), such as BRCA1 and BRCA2. You have Lynch syndrome. African  American men and men of African descent are diagnosed with prostate cancer at higher rates than other men. The reasons for this are not well understood and are likely due to a combination of genetic and environmental factors. What are the signs or symptoms? Symptoms of this condition include: Problems with urination. This may include: A weak or interrupted flow of urine. Trouble starting or stopping urination. Trouble emptying the bladder all the way. The need to urinate more often, especially at night. Blood in urine or semen. Persistent pain or discomfort in the lower back, lower abdomen, or hips. Trouble getting an erection. Weakness or numbness in the legs or feet. How is this diagnosed? This condition can be diagnosed with: A digital rectal exam. For this exam, a health care provider inserts a gloved finger into the rectum to feel the prostate gland. A blood test called a prostate-specific antigen (PSA) test. A procedure in which a sample of tissue is taken from the prostate and checked under a microscope (prostate biopsy). An imaging test called transrectal ultrasonography. Once the condition is diagnosed, tests will be done to determine how far the cancer has spread. This is called staging the cancer. Staging may involve imaging tests, such as a bone scan, CT scan, PET scan, or MRI. Stages of prostate cancer The stages of prostate cancer are as follows: Stage 1 (I). At this stage, the cancer is found in the prostate only. The cancer is not visible on imaging tests, and it is usually found by accident, such as during prostate surgery. Stage 2 (II). At this stage, the cancer is more advanced than it is in stage 1, but the cancer has not spread outside the prostate. Stage 3 (III). At this stage, the cancer has spread beyond the outer layer of the prostate to nearby tissues. The cancer may be found in the seminal vesicles, which are near the bladder and the prostate. Stage 4 (IV). At this  stage, the cancer has spread to other parts of the body, such as the lymph nodes, bones, bladder, rectum, liver, or lungs. Prostate cancer grading Prostate cancer is also graded according to how the cancer cells look under a microscope. This is called the Gleason score and the total score can range from 6-10, indicating how likely it is that the cancer will spread (metastasize) to other parts of the body. The higher the score, the greater the likelihood that the cancer will spread. Gleason 6 or lower: This indicates that the cancer cells look similar to normal prostate cells (well differentiated). Gleason 7: This indicates that the cancer cells look somewhat similar to normal prostate cells (moderately differentiated). Gleason 8, 9, or 10: This indicates that the cancer cells look very different than normal prostate cells (poorly differentiated). How is this treated? Treatment for this condition depends on several factors, including the stage of the cancer, your age, personal preferences, and your overall health. Talk with your health care provider about treatment options that are recommended for you. Common treatments include: Observation for early stage prostate cancer (active surveillance). This involves having exams, blood tests, and in some cases, more biopsies. For some men, this is the only treatment needed. Surgery. Types of surgeries include: Open surgery (radical prostatectomy). In this surgery, a larger incision is made to remove  the prostate. A laparoscopic radical prostatectomy. This is a surgery to remove the prostate and lymph nodes through several small incisions. It is often referred to as a minimally invasive surgery. A robotic radical prostatectomy. This is laparoscopic surgery to remove the prostate and lymph nodes with the help of robotic arms that are controlled by the surgeon. Cryoablation. This is surgery to freeze and destroy cancer cells. Radiation treatment. Types of  radiation treatment include: External beam radiation. This type aims beams of radiation from outside the body at the prostate to destroy cancerous cells. Brachytherapy. This type uses radioactive needles, seeds, wires, or tubes that are implanted into the prostate gland. Like external beam radiation, brachytherapy destroys cancerous cells. An advantage is that this type of radiation limits the damage to surrounding tissue and has fewer side effects. Chemotherapy. This treatment kills cancer cells or stops them from multiplying. It kills both cancer cells and normal cells. Targeted therapy. This treatment uses medicines to kill cancer cells without damaging normal cells. Hormone treatment. This treatment involves taking medicines that act on testosterone, one of the male hormones, by: Stopping your body from producing testosterone. Blocking testosterone from reaching cancer cells. Follow these instructions at home: Lifestyle Do not use any products that contain nicotine or tobacco. These products include cigarettes, chewing tobacco, and vaping devices, such as e-cigarettes. If you need help quitting, ask your health care provider. Eat a healthy diet. To do this: Eat foods that are high in fiber. These include beans, whole grains, and fresh fruits and vegetables. Limit foods that are high in fat and sugar. These include fried or sweet foods. Treatment for prostate cancer may affect sexual function. If you have a partner, continue to have intimate moments. This may include touching, holding, hugging, and caressing your partner. Get plenty of sleep. Consider joining a support group for men who have prostate cancer. Meeting with a support group may help you learn to manage the stress of having cancer. General instructions Take over-the-counter and prescription medicines only as told by your health care provider. If you have to go to the hospital, notify your cancer specialist (oncologist). Keep all  follow-up visits. This is important. Where to find more information American Cancer Society: www.cancer.[place] [facility] Clinical On[facility]: www.cancer.net [employer]: www.cancer.gov Contact a health care provider if: You have new or increasing trouble urinating. You have new or increasing blood in your urine. You have new or increasing pain in your hips, back, or chest. Get help right away if: You have weakness or numbness in your legs. You cannot control urination or your bowel movements (incontinence). You have chills or a fever. Summary The prostate is a small gland that is involved in the production of semen. It is located below a man's bladder, in front of the rectum. Prostate cancer is the abnormal growth of cells in the prostate gland. Treatment for this condition depends on the stage of the cancer, your age, personal preferences, and your overall health. Talk with your health care provider about treatment options that are recommended for you. Consider joining a support group for men who have prostate cancer. Meeting with a support group may help you learn to manage the stress of having cancer. This information is not intended to replace advice given to you by your health care provider. Make sure you discuss any questions you have with your health care provider. Document Revised: 07/05/2020 Document Reviewed: 07/05/2020 Elsevier Patient Education  [place] Many  factors influence your heart (coronary) health, including eating and exercise habits. Coronary risk increases with abnormal blood fat (lipid) levels. Heart-healthy meal planning includes limiting unhealthy fats, increasing healthy fats, and making other diet and lifestyle changes. What is my plan? Your health care provider may recommend that you: Limit your fat intake to _________% or less of your total calories each day. Limit your saturated fat intake to _________%  or less of your total calories each day. Limit the amount of cholesterol in your diet to less than _________ mg per day. What are tips for following this plan? Cooking Cook foods using methods other than frying. Baking, boiling, grilling, and broiling are all good options. Other ways to reduce fat include: Removing the skin from poultry. Removing all visible fats from meats. Steaming vegetables in water or broth. Meal planning  At meals, imagine dividing your plate into fourths: Fill one-half of your plate with vegetables and green salads. Fill one-fourth of your plate with whole grains. Fill one-fourth of your plate with lean protein foods. Eat 4-5 servings of vegetables per day. One serving equals 1 cup raw or cooked vegetable, or 2 cups raw leafy greens. Eat 4-5 servings of fruit per day. One serving equals 1 medium whole fruit,  cup dried fruit,  cup fresh, frozen, or canned fruit, or  cup 100% fruit juice. Eat more foods that contain soluble fiber. Examples include apples, broccoli, carrots, beans, peas, and barley. Aim to get 25-30 g of fiber per day. Increase your consumption of legumes, nuts, and seeds to 4-5 servings per week. One serving of dried beans or legumes equals  cup cooked, 1 serving of nuts is  cup, and 1 serving of seeds equals 1 tablespoon. Fats Choose healthy fats more often. Choose monounsaturated and polyunsaturated fats, such as olive and canola oils, flaxseeds, walnuts, almonds, and seeds. Eat more omega-3 fats. Choose salmon, mackerel, sardines, tuna, flaxseed oil, and ground flaxseeds. Aim to eat fish at least 2 times each week. Check food labels carefully to identify foods with trans fats or high amounts of saturated fat. Limit saturated fats. These are found in animal products, such as meats, butter, and cream. Plant sources of saturated fats include palm oil, palm kernel oil, and coconut oil. Avoid foods with partially hydrogenated oils in them. These  contain trans fats. Examples are stick margarine, some tub margarines, cookies, crackers, and other baked goods. Avoid fried foods. General information Eat more home-cooked food and less restaurant, buffet, and fast food. Limit or avoid alcohol. Limit foods that are high in starch and sugar. Lose weight if you are overweight. Losing just 5-10% of your body weight can help your overall health and prevent diseases such as diabetes and heart disease. Monitor your salt (sodium) intake, especially if you have high blood pressure. Talk with your health care provider about your sodium intake. Try to incorporate more vegetarian meals weekly. What foods can I eat? Fruits All fresh, canned (in natural juice), or frozen fruits. Vegetables Fresh or frozen vegetables (raw, steamed, roasted, or grilled). Green salads. Grains Most grains. Choose whole wheat and whole grains most of the time. Rice and pasta, including brown rice and pastas made with whole wheat. Meats and other proteins Lean, well-trimmed beef, veal, pork, and lamb. Chicken and [place] without skin. All fish and shellfish. Wild duck, rabbit, pheasant, and venison. Egg whites or low-cholesterol egg substitutes. Dried beans, peas, lentils, and tofu. Seeds and most nuts. Dairy Low-fat or nonfat cheeses, including ricotta and  mozzarella. Skim or 1% milk (liquid, powdered, or evaporated). Buttermilk made with low-fat milk. Nonfat or low-fat yogurt. Fats and oils Non-hydrogenated (trans-free) margarines. Vegetable oils, including soybean, sesame, sunflower, olive, peanut, safflower, corn, canola, and cottonseed. Salad dressings or mayonnaise made with a vegetable oil. Beverages Water (mineral or sparkling). Coffee and tea. Diet carbonated beverages. Sweets and desserts Sherbet, gelatin, and fruit ice. Small amounts of dark chocolate. Limit all sweets and desserts. Seasonings and condiments All seasonings and condiments. The items listed above  may not be a complete list of foods and beverages you can eat. Contact a dietitian for more options. What foods are not recommended? Fruits Canned fruit in heavy syrup. Fruit in cream or butter sauce. Fried fruit. Limit coconut. Vegetables Vegetables cooked in cheese, cream, or butter sauce. Fried vegetables. Grains Breads made with saturated or trans fats, oils, or whole milk. Croissants. Sweet rolls. Donuts. High-fat crackers, such as cheese crackers. Meats and other proteins Fatty meats, such as hot dogs, ribs, sausage, bacon, rib-eye roast or steak. High-fat deli meats, such as salami and bologna. Caviar. Domestic duck and goose. Organ meats, such as liver. Dairy Cream, sour cream, cream cheese, and creamed cottage cheese. Whole-milk cheeses. Whole or 2% milk (liquid, evaporated, or condensed). Whole buttermilk. Cream sauce or high-fat cheese sauce. Whole-milk yogurt. Fats and oils Meat fat, or shortening. Cocoa butter, hydrogenated oils, palm oil, coconut oil, palm kernel oil. Solid fats and shortenings, including bacon fat, salt pork, lard, and butter. Nondairy cream substitutes. Salad dressings with cheese or sour cream. Beverages Regular sodas and any drinks with added sugar. Sweets and desserts Frosting. Pudding. Cookies. Cakes. Pies. Milk chocolate or white chocolate. Buttered syrups. Full-fat ice cream or ice cream drinks. The items listed above may not be a complete list of foods and beverages to avoid. Contact a dietitian for more information. Summary Heart-healthy meal planning includes limiting unhealthy fats, increasing healthy fats, and making other diet and lifestyle changes. Lose weight if you are overweight. Losing just 5-10% of your body weight can help your overall health and prevent diseases such as diabetes and heart disease. Focus on eating a balance of foods, including fruits and vegetables, low-fat or nonfat dairy, lean protein, nuts and legumes, whole grains, and  heart-healthy oils and fats. This information is not intended to replace advice given to you by your health care provider. Make sure you discuss any questions you have with your health care provider. Document Revised: 08/17/2020 Document Reviewed: 08/17/2020 Elsevier Patient Education  2022 [place]. 11/28/21

## 2021-09-21 ENCOUNTER — Encounter (HOSPITAL_COMMUNITY): Payer: Self-pay | Admitting: Emergency Medicine

## 2021-09-21 ENCOUNTER — Emergency Department (HOSPITAL_COMMUNITY): Payer: No Typology Code available for payment source

## 2021-09-21 ENCOUNTER — Other Ambulatory Visit: Payer: Self-pay

## 2021-09-21 ENCOUNTER — Encounter: Payer: Self-pay | Admitting: Oncology

## 2021-09-21 ENCOUNTER — Emergency Department (HOSPITAL_COMMUNITY)
Admission: EM | Admit: 2021-09-21 | Discharge: 2021-09-21 | Disposition: A | Discharge status: Home / Self Care | Payer: No Typology Code available for payment source | Attending: Emergency Medicine | Admitting: Emergency Medicine

## 2021-09-21 DIAGNOSIS — Z79899 Other long term (current) drug therapy: Secondary | ICD-10-CM | POA: Insufficient documentation

## 2021-09-21 DIAGNOSIS — Z8546 Personal history of malignant neoplasm of prostate: Secondary | ICD-10-CM | POA: Diagnosis not present

## 2021-09-21 DIAGNOSIS — Z23 Encounter for immunization: Secondary | ICD-10-CM | POA: Diagnosis not present

## 2021-09-21 DIAGNOSIS — S0181XA Laceration without foreign body of other part of head, initial encounter: Secondary | ICD-10-CM | POA: Diagnosis not present

## 2021-09-21 DIAGNOSIS — S0990XA Unspecified injury of head, initial encounter: Secondary | ICD-10-CM

## 2021-09-21 DIAGNOSIS — W01198A Fall on same level from slipping, tripping and stumbling with subsequent striking against other object, initial encounter: Secondary | ICD-10-CM | POA: Insufficient documentation

## 2021-09-21 DIAGNOSIS — W19XXXA Unspecified fall, initial encounter: Secondary | ICD-10-CM

## 2021-09-21 DIAGNOSIS — E039 Hypothyroidism, unspecified: Secondary | ICD-10-CM | POA: Diagnosis not present

## 2021-09-21 DIAGNOSIS — J45909 Unspecified asthma, uncomplicated: Secondary | ICD-10-CM | POA: Insufficient documentation

## 2021-09-21 DIAGNOSIS — G9389 Other specified disorders of brain: Secondary | ICD-10-CM | POA: Diagnosis not present

## 2021-09-21 DIAGNOSIS — S0101XA Laceration without foreign body of scalp, initial encounter: Secondary | ICD-10-CM | POA: Diagnosis not present

## 2021-09-21 DIAGNOSIS — R9082 White matter disease, unspecified: Secondary | ICD-10-CM | POA: Diagnosis not present

## 2021-09-21 MED ORDER — LIDOCAINE HCL (PF) 1 % IJ SOLN
5.0000 mL | Freq: Once | INTRAMUSCULAR | Status: AC
  Administered 2021-09-21: 5 mL
  Filled 2021-09-21: qty 5

## 2021-09-21 MED ORDER — TETANUS-DIPHTH-ACELL PERTUSSIS 5-2.5-18.5 LF-MCG/0.5 IM SUSY
0.5000 mL | PREFILLED_SYRINGE | Freq: Once | INTRAMUSCULAR | Status: AC
  Administered 2021-09-21: 0.5 mL via INTRAMUSCULAR
  Filled 2021-09-21: qty 0.5

## 2021-09-21 NOTE — ED Notes (Signed)
Lacerations  to forehead cleanse with NS, applied xeroform and wrapped wound with kerlix gauze. Patient tolerated procedure.

## 2021-09-21 NOTE — Discharge Instructions (Signed)
Have the stitches taken out in about 5 days. 

## 2021-09-21 NOTE — ED Triage Notes (Signed)
Pt had mechanical fall this morning due to loosing his balance. Pt presents with 3 lacerations to right forehead. Pt denies taking blood thinners.

## 2021-09-21 NOTE — ED Notes (Signed)
Cleanse wound with Normal saline, no distress noted.

## 2021-09-21 NOTE — ED Provider Notes (Signed)
[facility] Provider Note   CSN: [id number] Arrival date & time: 09/21/21  2015     History  Chief Complaint  Patient presents with   Thomas [name] is a 76 y.o. male.   Fall Patient presents with fall.  Tripped and fell after losing his balance earlier today.  Hit head.  No loss conscious.  Not on blood thinners.  No neck pain.  Bleeding seems controlled now but does have 3 lacerations on his forehead.    Past Medical History:  Diagnosis Date   Allergy    trees and plants   Anemia    IDA   Anxiety    Asthma 1998   Diverticulitis    Elevated PSA    GERD (gastroesophageal reflux disease)    Hyperlipidemia    Hypothyroidism 03/16/2019   Prostate cancer ([place])     Home Medications Prior to Admission medications   Medication Sig Start Date End Date Taking? Authorizing Provider  acetaminophen (TYLENOL) 500 MG tablet Take 500 mg by mouth every 6 (six) hours as needed.    [provider]  albuterol (VENTOLIN HFA) 108 (90 Base) MCG/ACT inhaler TAKE 2 PUFFS BY MOUTH EVERY 6 HOURS AS NEEDED FOR WHEEZE 07/19/20   [name], MD  Ascorbic Acid (VITAMIN C) 100 MG tablet Take 100 mg by mouth daily.    [provider]  busPIRone (BUSPAR) 10 MG tablet Take 1 tablet by mouth twice daily 07/16/21   [name], MD  co-enzyme Q-10 30 MG capsule Take 30 mg by mouth daily.    [provider]  ezetimibe (ZETIA) 10 MG tablet Take 1 tablet (10 mg total) by mouth daily. 08/22/21   [name], MD  ferrous sulfate 325 (65 FE) MG tablet Take 325 mg by mouth daily with breakfast.    [provider]  fexofenadine (ALLEGRA) 180 MG tablet Take 180 mg by mouth daily.    [provider]  FIBER PO Take 1 tablet by mouth daily.    [provider]  fluticasone (FLONASE) 50 MCG/ACT nasal spray Place 2 sprays into both nostrils daily. 08/22/21   [name], MD  fluticasone furoate-vilanterol (BREO ELLIPTA)  200-25 MCG/INH AEPB 1 puff q am daily 07/19/20   [name], MD  levothyroxine (SYNTHROID) 50 MCG tablet Take 1 1/2 tablets on M W Fri Sat Sun and take 1 on Tues and Thursday 08/22/21   [name], MD  Melatonin 1 MG TABS Take 5 mg by mouth at bedtime as needed.    [provider]  Multiple Vitamins-Minerals (MULTIVITAMIN WITH MINERALS) tablet Take 1 tablet by mouth daily.    [provider]  nitroGLYCERIN (NITROSTAT) 0.4 MG SL tablet Place 1 tablet (0.4 mg total) under the tongue every 5 (five) minutes as needed for chest pain. 05/21/21   [name], MD  Omega-3 Fatty Acids (FISH OIL) 1000 MG CAPS Take 1 capsule by mouth daily.    [provider]  pantoprazole (PROTONIX) 40 MG tablet Take 1 tablet (40 mg total) by mouth daily. 08/22/21   [name], MD  Red Yeast Rice Extract 300 MG CAPS Take 1 tablet by mouth daily.    [provider]  triamcinolone (KENALOG) 0.1 % Apply 1 application topically 2 (two) times daily. 07/24/20   [name], MD      Allergies    Augmentin [amoxicillin-pot clavulanate], Ciprofloxacin, Nsaids, and Rosuvastatin  Review of Systems   Review of Systems  Physical Exam Updated Vital Signs BP (!) 169/84 (BP Location: Left Arm)   Pulse 97   Temp 98 F (36.7 C) (Oral)   Resp 16   Ht '5\' 3"'$  (1.6 m)   Wt 81.2 kg   SpO2 94%   BMI 31.71 kg/m  Physical Exam Vitals and nursing note reviewed.  HENT:     Head:     Comments:  horizontal lacerations above the eyebrow on right.  Approximately 2 cm long each but only the medial aspect is a complete laceration.  Further up on the right side of the forehead has a curved flap laceration approximately 2 cm long also. Cardiovascular:     Rate and Rhythm: Regular rhythm.  Musculoskeletal:        General: No tenderness.     Cervical back: Neck supple. No tenderness.  Skin:    General: Skin is warm.     Capillary Refill: Capillary refill takes less than 2 seconds.   Neurological:     Mental Status: He is alert and oriented to person, place, and time.    ED Results / Procedures / Treatments   Labs (all labs ordered are listed, but only abnormal results are displayed) Labs Reviewed - No data to display  EKG None  Radiology No results found.  Procedures .Marland KitchenLaceration Repair  Date/Time: 09/21/2021 11:11 PM Performed by: Davonna Belling, MD Authorized by: Davonna Belling, MD   Consent:    Consent obtained:  Verbal   Consent given by:  Patient   Risks discussed:  Infection, pain and retained foreign body   Alternatives discussed:  No treatment Universal protocol:    Patient identity confirmed:  Verbally with patient Anesthesia:    Anesthesia method:  Local infiltration   Local anesthetic:  Lidocaine 1% w/o epi Laceration details:    Location:  Face   Face location:  Forehead   Wound length (cm): 3 separate lacerations.  A total of about 6 cm. Pre-procedure details:    Preparation:  Patient was prepped and draped in usual sterile fashion Exploration:    Limited defect created (wound extended): no     Hemostasis achieved with:  Direct pressure   Wound exploration: wound explored through full range of motion     Contaminated: no   Treatment:    Area cleansed with:  Saline   Amount of cleaning:  Standard Skin repair:    Repair method:  Sutures   Suture size:  4-0   Wound skin closure material used: vicryl rapide.   Suture technique:  Simple interrupted   Number of sutures:  7 Approximation:    Approximation:  Close Repair type:    Repair type:  Simple Post-procedure details:    Dressing:  Sterile dressing   Procedure completion:  Tolerated well, no immediate complications    Medications Ordered in ED Medications  lidocaine (PF) (XYLOCAINE) 1 % injection 5 mL (5 mLs Infiltration Given 09/21/21 2210)  Tdap (BOOSTRIX) injection 0.5 mL (0.5 mLs Intramuscular Given 09/21/21 2206)    ED Course/ Medical Decision Making/ A&P                            Medical Decision Making Amount and/or Complexity of Data Reviewed Radiology: ordered.  Risk Prescription drug management.   Patient with fall.  Forehead lacerations.  Closed with sutures.  With age will get CT scan.  No other apparent injury.  No trunk trauma.  Should be able to discharge home.  CT scan reviewed and showed no intracranial hemorrhage.  No fracture.  Appears stable for discharge home          Final Clinical Impression(s) / ED Diagnoses Final diagnoses:  None    Rx / DC Orders ED Discharge Orders     None         Davonna Belling, MD 09/21/21 2359

## 2021-09-24 ENCOUNTER — Ambulatory Visit (INDEPENDENT_AMBULATORY_CARE_PROVIDER_SITE_OTHER): Payer: No Typology Code available for payment source | Admitting: Family Medicine

## 2021-09-24 ENCOUNTER — Encounter: Payer: Self-pay | Admitting: Family Medicine

## 2021-09-24 VITALS — BP 128/78 | HR 91 | Temp 97.3°F | Wt 179.0 lb

## 2021-09-24 DIAGNOSIS — S0003XD Contusion of scalp, subsequent encounter: Secondary | ICD-10-CM

## 2021-09-24 DIAGNOSIS — S0003XA Contusion of scalp, initial encounter: Secondary | ICD-10-CM

## 2021-09-24 MED ORDER — MUPIROCIN 2 % EX OINT: TOPICAL_OINTMENT | CUTANEOUS | 0 refills | Status: DC

## 2021-09-24 NOTE — Progress Notes (Signed)
   Subjective:    Patient ID: Thomas [name], male    DOB: Dec 28, 1945, 76 y.o.   MRN: [id number]  HPI Pt had fall on Friday. Pt states he was going to go to the store and fell down steps. Pt was not using cane at the time of fall. Pt had CT scan completed and that was negative. Pt is sore all over.  Unfortunately patient took significant fall denies any loss of consciousness ended up having to have a monitor absorbable sutures placed in He is now concerned with the swelling in his face mainly under his right eye and some under his left eye  Review of Systems     Objective:   Physical Exam  General-in no acute distress Eyes-no discharge Lungs-respiratory rate normal, CTA CV-no murmurs,RRR Extremities skin warm dry no edema Neuro grossly normal Behavior normal, alert He has significant bruising noted he has a large scrape on his forehead along with laceration the sutures look good He has significant swelling under both eyes more in the right eye than the left eye      Assessment & Plan:  Contusion Bactroban ointment twice daily Cool compresses Follow-up on Thursday for recheck Use cane with walking avoid all falls No additional x-rays needed

## 2021-09-27 ENCOUNTER — Encounter: Payer: Self-pay | Admitting: Family Medicine

## 2021-09-27 ENCOUNTER — Ambulatory Visit (INDEPENDENT_AMBULATORY_CARE_PROVIDER_SITE_OTHER): Payer: No Typology Code available for payment source | Admitting: Family Medicine

## 2021-09-27 VITALS — BP 128/80 | Wt 177.0 lb

## 2021-09-27 DIAGNOSIS — S0003XD Contusion of scalp, subsequent encounter: Secondary | ICD-10-CM | POA: Diagnosis not present

## 2021-09-27 NOTE — Progress Notes (Signed)
   Subjective:    Patient ID: Thomas [name], male    DOB: 1946-03-15, 76 y.o.   MRN: [id number]  HPI Pt following up on contusion of scalp and fall. Pt states he is doing good. Pt does report having no energy. Has not been taking vitamins as regular and no appetite. Pt will eat breakfast most days; eats little bit of lunch and supper.   Today to recheck an area on the forehead where he had an abrasion along with sutures placed they were absorbable sutures  He states headache doing better his appetite is low low energy. Review of Systems     Objective:   Physical Exam Lungs clear heart regular patient relates normally abrasion noted on the forehead laceration seem to be well-healed but it appears his injury took out a chunk of his skin this would take time for it to heal from the inside out  Absorbable sutures removed approximately 6 were removed without difficulty Denies being depressed currently    Assessment & Plan:  Warning signs regarding infection was discussed More than likely his lack of energy and fatigue postconcussion syndrome If not doing better over the next couple weeks follow-up sooner Regular follow-up visit by fall time No lab work indicated today but if he persists having low appetite we will need to start running additional test Emotionally patient does not get a lot of interaction which increases risk of depression

## 2021-11-19 ENCOUNTER — Ambulatory Visit: Payer: Self-pay | Admitting: Family Medicine

## 2021-11-19 ENCOUNTER — Telehealth: Payer: Self-pay | Admitting: Family Medicine

## 2021-11-19 NOTE — Telephone Encounter (Signed)
Error

## 2021-11-20 ENCOUNTER — Ambulatory Visit (INDEPENDENT_AMBULATORY_CARE_PROVIDER_SITE_OTHER): Payer: No Typology Code available for payment source | Admitting: Family Medicine

## 2021-11-20 ENCOUNTER — Encounter: Payer: Self-pay | Admitting: Family Medicine

## 2021-11-20 VITALS — BP 136/82 | HR 83 | Temp 97.2°F | Wt 176.4 lb

## 2021-11-20 DIAGNOSIS — R600 Localized edema: Secondary | ICD-10-CM | POA: Diagnosis not present

## 2021-11-20 DIAGNOSIS — W57XXXA Bitten or stung by nonvenomous insect and other nonvenomous arthropods, initial encounter: Secondary | ICD-10-CM

## 2021-11-20 MED ORDER — TRIAMCINOLONE ACETONIDE 0.1 % EX CREA
1.0000 | TOPICAL_CREAM | Freq: Two times a day (BID) | CUTANEOUS | 0 refills | Status: DC
Start: 2021-11-20 — End: 2022-04-01

## 2021-11-20 NOTE — Progress Notes (Signed)
   Subjective:    Patient ID: Thomas [name], male    DOB: 07/07/1945, 76 y.o.   MRN: [id number]  HPI Pt here today due to feet swelling. Pt went to convention last weekend and did lots of walking.  Pt has red areas on top of both feet. Pt states red areas do itch.  Pt using epsom salt to help with swelling He recently went to to mention did a lot of walking and relates he is having some swelling in the lower legs denies any chest tightness or shortness of breath He was also working outside and noticed later on a rash with a function of bumps on his feet that itch a lot  Review of Systems     Objective:   Physical Exam  Lungs are clear there is no crackles heart is regular pulse normal extremities trace edema He also has multiple bug bites on the lower legs bilateral     Assessment & Plan:  Bug bites-triamcinolone cream apply twice daily as needed should gradually get better Use DEET in the future before working outside to lessen the risk of this happening possibly chiggers possibly strong mites  Pedal edema more than likely related to him being on his feet a whole lot more than normal with his recent conference.  I would not recommend diuretics at this point.  Find no evidence of CHF going on.  If he is not dramatically better within the next 2 weeks to let us know  He may keep his feet propped up when at rest

## 2021-11-28 ENCOUNTER — Ambulatory Visit (INDEPENDENT_AMBULATORY_CARE_PROVIDER_SITE_OTHER): Payer: No Typology Code available for payment source | Admitting: *Deleted

## 2021-11-28 DIAGNOSIS — C61 Malignant neoplasm of prostate: Secondary | ICD-10-CM

## 2021-11-28 DIAGNOSIS — E782 Mixed hyperlipidemia: Secondary | ICD-10-CM

## 2021-11-28 NOTE — Chronic Care Management (AMB) (Signed)
Chronic Care Management   CCM RN Visit Note  11/28/2021 Name: Thomas [name] MRN: [id number] DOB: [birth year]  Subjective: Thomas [name] is a 76 y.o. year old male who is a primary care patient of [name], Elayne Snare, MD. The care management team was consulted for assistance with disease management and care coordination needs.    Engaged with patient by telephone for follow up visit in response to provider referral for case management and/or care coordination services.   Consent to Services:  The patient was given information about Chronic Care Management services, agreed to services, and gave verbal consent prior to initiation of services.  Please see initial visit note for detailed documentation.   Patient agreed to services and verbal consent obtained.   Assessment: Review of patient past medical history, allergies, medications, health status, including review of consultants reports, laboratory and other test data, was performed as part of comprehensive evaluation and provision of chronic care management services.   SDOH (Social Determinants of Health) assessments and interventions performed:    CCM Care Plan  Allergies  Allergen Reactions   Augmentin [Amoxicillin-Pot Clavulanate]     Gi upset   Ciprofloxacin    Nsaids Other (See Comments)    Stomach ulcer    Rosuvastatin     Myalgias    Outpatient Encounter Medications as of 11/28/2021  Medication Sig Note   acetaminophen (TYLENOL) 500 MG tablet Take 500 mg by mouth every 6 (six) hours as needed.    albuterol (VENTOLIN HFA) 108 (90 Base) MCG/ACT inhaler TAKE 2 PUFFS BY MOUTH EVERY 6 HOURS AS NEEDED FOR WHEEZE 01/12/2021: Uses 2 puffs daily   Ascorbic Acid (VITAMIN C) 100 MG tablet Take 100 mg by mouth daily.    busPIRone (BUSPAR) 10 MG tablet Take 1 tablet by mouth twice daily    co-enzyme Q-10 30 MG capsule Take 30 mg by mouth daily.    ezetimibe (ZETIA) 10 MG tablet Take 1 tablet (10 mg total) by mouth daily.    ferrous  sulfate 325 (65 FE) MG tablet Take 325 mg by mouth daily with breakfast.    fexofenadine (ALLEGRA) 180 MG tablet Take 180 mg by mouth daily.    FIBER PO Take 1 tablet by mouth daily.    fluticasone (FLONASE) 50 MCG/ACT nasal spray Place 2 sprays into both nostrils daily.    fluticasone furoate-vilanterol (BREO ELLIPTA) 200-25 MCG/INH AEPB 1 puff q am daily    levothyroxine (SYNTHROID) 50 MCG tablet Take 1 1/2 tablets on M W Fri Sat Sun and take 1 on Tues and Thursday    Melatonin 1 MG TABS Take 5 mg by mouth at bedtime as needed.    Multiple Vitamins-Minerals (MULTIVITAMIN WITH MINERALS) tablet Take 1 tablet by mouth daily.    mupirocin ointment (BACTROBAN) 2 % Apply thin amount twice a day to forehead wound for next 7 days    nitroGLYCERIN (NITROSTAT) 0.4 MG SL tablet Place 1 tablet (0.4 mg total) under the tongue every 5 (five) minutes as needed for chest pain.    Omega-3 Fatty Acids (FISH OIL) 1000 MG CAPS Take 1 capsule by mouth daily.    pantoprazole (PROTONIX) 40 MG tablet Take 1 tablet (40 mg total) by mouth daily.    Red Yeast Rice Extract 300 MG CAPS Take 1 tablet by mouth daily.    triamcinolone cream (KENALOG) 0.1 % Apply 1 Application topically 2 (two) times daily.    No facility-administered encounter medications on file as of 11/28/2021.  Patient Active Problem List   Diagnosis Date Noted   Myalgia due to statin 08/22/2021   Chronic bilateral low back pain without sciatica 08/22/2020   Malignant neoplasm of prostate ([place]) 06/30/2020   Hypothyroidism 03/16/2019   GERD (gastroesophageal reflux disease) 11/29/2015   Insomnia 11/29/2015   Prostate cancer ([place]) 12/30/2014   Hyperlipidemia 12/30/2014   Anemia 07/19/2013   Iron deficiency 07/19/2013   LUQ abdominal pain 07/19/2013   Rectal bleeding 07/19/2013    Conditions to be addressed/monitored:HLD and Prostate cancer  Care Plan : RN Care Manager Plan of Care  Updates made by Kassie Mends, RN since 11/28/2021 12:00 AM   Completed 11/28/2021   Problem: No plan of care established for management of chronic disease state  (HLD, Prostate cancer) Resolved 11/28/2021  Priority: High     Long-Range Goal: Development of plan of care for chronic disease management (HLD, Prostate cancer) Completed 11/28/2021  Start Date: 09/19/2021  Expected End Date: 03/18/2022  Priority: High  Note:   Current Barriers:  Knowledge Deficits related to plan of care for management of HLD and Prostate cancer  Patient reports he lives alone, has sister he can call on if needed, continues to drive, is independent in all aspects of his care.  Patient reports he tries to eat healthy and for exercise works around his house and yard. Patient reports he receives Eligard injection q 6 months for management of prostate cancer. 11/28/21- Spoke with patient who reports he is overall doing well, states he continues Eligard injection q 6 months, has all medications and taking as prescribed, no new concerns reported today.  RNCM Clinical Goal(s):  Patient will verbalize understanding of plan for management of HLD and Prostate cancer as evidenced by patient report, review of EHR and  through collaboration with RN Care manager, provider, and care team.   Interventions: 1:1 collaboration with primary care provider regarding development and update of comprehensive plan of care as evidenced by provider attestation and co-signature Inter-disciplinary care team collaboration (see longitudinal plan of care) Evaluation of current treatment plan related to  self management and patient's adherence to plan as established by provider   Oncology:  (Status: New goal. and Goal on track:  Yes.) Long Term Goal Assessment of understanding of oncology diagnosis:  Assessed patient understanding of cancer diagnosis and recommended treatment plan, Reviewed upcoming provider appointments and treatment appointments, Assessed available transportation to appointments and  treatments. Has consistent/reliable transportation: Yes, Assessed support system. Has consistent/reliable family or other support: Yes, and PHQ2/PHQ9 performed Practice handwashing and avoid sick people Pain assessment completed Plan of care discussed with patient including case closure today  Hyperlipidemia:  (Status: New goal. Goal on Track (progressing): YES.) Long Term Goal  Lab Results  Component Value Date   CHOL 242 (H) 08/20/2021   HDL 78 08/20/2021   LDLCALC 141 (H) 08/20/2021   TRIG 133 08/20/2021   CHOLHDL 3.1 08/20/2021     Medication review performed; medication list updated in electronic medical record.  Provider established cholesterol goals reviewed; Counseled on importance of regular laboratory monitoring as prescribed; Provided HLD educational materials; Reviewed importance of limiting foods high in cholesterol; Reviewed exercise goals and target of 150 minutes per week; Reinforced safety precautions  Patient Goals/Self-Care Activities: Take medications as prescribed   Attend all scheduled provider appointments Call pharmacy for medication refills 3-7 days in advance of running out of medications Attend church or other social activities Perform all self care activities independently  Perform IADL's (shopping,  preparing meals, housekeeping, managing finances) independently Call provider office for new concerns or questions  - take all medications exactly as prescribed - call doctor with any symptoms you believe are related to your medicine - call doctor when you experience any new symptoms - go to all doctor appointments as scheduled - adhere to prescribed diet: heart healthy Follow heart healthy diet Case closure today- please talk with your doctor if you need any future care management services fall prevention strategies: change position slowly, use assistive device such as walker or cane (per provider recommendations) when walking, keep walkways clear, have  good lighting in room. It is important to contact your provider if you have any falls, maintain muscle strength/tone by exercise per provider recommendations.       Plan:No further follow up required: case closure today  Jacqlyn Larsen [facility], BSN RN Case Manager [place] [phone]

## 2021-11-28 NOTE — Patient Instructions (Signed)
Visit Information  Thank you for taking time to visit with me today. Please don't hesitate to contact me if I can be of assistance to you before our next scheduled telephone appointment.  Following are the goals we discussed today:  Take medications as prescribed   Attend all scheduled provider appointments Call pharmacy for medication refills 3-7 days in advance of running out of medications Attend church or other social activities Perform all self care activities independently  Perform IADL's (shopping, preparing meals, housekeeping, managing finances) independently Call provider office for new concerns or questions  - take all medications exactly as prescribed - call doctor with any symptoms you believe are related to your medicine - call doctor when you experience any new symptoms - go to all doctor appointments as scheduled - adhere to prescribed diet: heart healthy Follow heart healthy diet Case closure today- please talk with your doctor if you need any future care management services fall prevention strategies: change position slowly, use assistive device such as walker or cane (per provider recommendations) when walking, keep walkways clear, have good lighting in room. It is important to contact your provider if you have any falls, maintain muscle strength/tone by exercise per provider recommendations.   Please call the care guide team at [phone] if you need to cancel or reschedule your appointment.   If you are experiencing a Mental Health or [place] or need someone to talk to, please call the Suicide and Crisis Lifeline: 988 call the [place] National Suicide Prevention Lifeline: (801)868-6786 or TTY: (754)826-8335 TTY 301-311-0599) to talk to a trained counselor call 1-800-273-TALK (toll free, 24 hour hotline) go to [facility] [address] [place] [id number]) call the [place]:  [phone] call 911   Patient verbalizes understanding of instructions and care plan provided today and agrees to view in [facility]. Active MyChart status and patient understanding of how to access instructions and care plan via MyChart confirmed with patient.     Jacqlyn Larsen [facility], BSN RN Case Manager [facility] [phone]

## 2021-12-10 ENCOUNTER — Ambulatory Visit: Payer: No Typology Code available for payment source | Admitting: Family Medicine

## 2021-12-17 ENCOUNTER — Other Ambulatory Visit: Payer: Self-pay | Admitting: Family Medicine

## 2021-12-19 ENCOUNTER — Ambulatory Visit (INDEPENDENT_AMBULATORY_CARE_PROVIDER_SITE_OTHER): Payer: No Typology Code available for payment source

## 2021-12-19 VITALS — Wt 176.0 lb

## 2021-12-19 DIAGNOSIS — Z0000 Encounter for general adult medical examination without abnormal findings: Secondary | ICD-10-CM

## 2021-12-19 NOTE — Progress Notes (Signed)
Virtual Visit via Telephone Note  I connected with  Thomas [name] on 12/19/21 at  9:00 AM EDT by telephone and verified that I am speaking with the correct person using two identifiers.  Location: Patient: home Provider: [facility] Persons participating in the virtual visit: patient/Nurse Health Advisor   I discussed the limitations, risks, security and privacy concerns of performing an evaluation and management service by telephone and the availability of in person appointments. The patient expressed understanding and agreed to proceed.  Interactive audio and video telecommunications were attempted between this nurse and patient, however failed, due to patient having technical difficulties OR patient did not have access to video capability.  We continued and completed visit with audio only.  Some vital signs may be absent or patient reported.   Thomas David, LPN  Subjective:   Thomas [name] is a 76 y.o. male who presents for Medicare Annual/Subsequent preventive examination.  Review of Systems     Cardiac Risk Factors include: advanced age (>32mn, >>37women);male gender     Objective:    There were no vitals filed for this visit. There is no height or weight on file to calculate BMI.     12/19/2021    9:06 AM 09/21/2021    8:33 PM 09/19/2021    1:06 PM 07/09/2021   10:11 AM 01/08/2021    1:31 PM 06/29/2020    2:42 PM 06/22/2020    8:55 AM  Advanced Directives  Does Patient Have a Medical Advance Directive? Yes Yes Yes No Yes Yes Yes  Type of AParamedicof [place] will [place] will [place] will  [place] will  [place] will  Does patient want to make changes to medical advance directive? Yes (Inpatient - patient defers changing a medical advance directive and declines information at this time) No - Patient declined No - Patient declined   No - Patient declined    Copy of [place] Chart? Yes - validated most recent copy scanned in chart (See row information)  No - copy requested    No - copy requested  Would patient like information on creating a medical advance directive?    Yes (MAU/Ambulatory/Procedural Areas - Information given)       Current Medications (verified) Outpatient Encounter Medications as of 12/19/2021  Medication Sig   acetaminophen (TYLENOL) 500 MG tablet Take 500 mg by mouth every 6 (six) hours as needed.   albuterol (VENTOLIN HFA) 108 (90 Base) MCG/ACT inhaler TAKE 2 PUFFS BY MOUTH EVERY 6 HOURS AS NEEDED FOR WHEEZE   Ascorbic Acid (VITAMIN C) 100 MG tablet Take 100 mg by mouth daily.   BREO ELLIPTA 200-25 MCG/ACT AEPB INHALE 1 PUFF BY MOUTH ONCE DAILY IN THE MORNING   busPIRone (BUSPAR) 10 MG tablet Take 1 tablet by mouth twice daily   co-enzyme Q-10 30 MG capsule Take 30 mg by mouth daily.   ferrous sulfate 325 (65 FE) MG tablet Take 325 mg by mouth daily with breakfast.   fexofenadine (ALLEGRA) 180 MG tablet Take 180 mg by mouth daily.   FIBER PO Take 1 tablet by mouth daily.   fluticasone (FLONASE) 50 MCG/ACT nasal spray Place 2 sprays into both nostrils daily.   levothyroxine (SYNTHROID) 50 MCG tablet Take 1 1/2 tablets on M W Fri Sat Sun and take 1 on Tues and Thursday   Melatonin 1 MG TABS Take 5 mg  by mouth at bedtime as needed.   Multiple Vitamins-Minerals (MULTIVITAMIN WITH MINERALS) tablet Take 1 tablet by mouth daily.   nitroGLYCERIN (NITROSTAT) 0.4 MG SL tablet Place 1 tablet (0.4 mg total) under the tongue every 5 (five) minutes as needed for chest pain.   Omega-3 Fatty Acids (FISH OIL) 1000 MG CAPS Take 1 capsule by mouth daily.   pantoprazole (PROTONIX) 40 MG tablet Take 1 tablet (40 mg total) by mouth daily.   Red Yeast Rice Extract 300 MG CAPS Take 1 tablet by mouth daily.   triamcinolone cream (KENALOG) 0.1 % Apply 1 Application topically 2 (two) times daily.    ezetimibe (ZETIA) 10 MG tablet Take 1 tablet (10 mg total) by mouth daily. (Patient not taking: Reported on 12/19/2021)   mupirocin ointment (BACTROBAN) 2 % Apply thin amount twice a day to forehead wound for next 7 days (Patient not taking: Reported on 12/19/2021)   No facility-administered encounter medications on file as of 12/19/2021.    Allergies (verified) Augmentin [amoxicillin-pot clavulanate], Ciprofloxacin, Nsaids, and Rosuvastatin   History: Past Medical History:  Diagnosis Date   Allergy    trees and plants   Anemia    IDA   Anxiety    Asthma 1998   Diverticulitis    Elevated PSA    GERD (gastroesophageal reflux disease)    Hyperlipidemia    Hypothyroidism 03/16/2019   Prostate cancer [facility])    Past Surgical History:  Procedure Laterality Date   HERNIA REPAIR  1999   prostate cancer     TRANSURETHRAL RESECTION OF PROSTATE     VASECTOMY     Family History  Problem Relation Age of Onset   Heart disease Father    Hypertension Brother    Diabetes Brother    Social History   Socioeconomic History   Marital status: Married    Spouse name: Not on file   Number of children: Not on file   Years of education: Not on file   Highest education level: Not on file  Occupational History   Not on file  Tobacco Use   Smoking status: Former    Types: Cigarettes    Quit date: 04/22/1982    Years since quitting: 39.6   Smokeless tobacco: Never  Vaping Use   Vaping Use: Never used  Substance and Sexual Activity   Alcohol use: Not Currently    Comment: bourbon once every night   Drug use: Not Currently   Sexual activity: Not Currently  Other Topics Concern   Not on file  Social History Narrative   Not on file   Social Determinants of Health   Financial Resource Strain: Low Risk  (12/19/2021)   Overall Financial Resource Strain (CARDIA)    Difficulty of Paying Living Expenses: Not hard at all  Food Insecurity: No Food Insecurity (12/19/2021)   Hunger Vital Sign     Worried About Running Out of Food in the Last Year: Never true    S[place] in the Last Year: Never true  Transportation Needs: No Transportation Needs (12/19/2021)   PRAPARE - Hydrologist (Medical): No    Lack of Transportation (Non-Medical): No  Physical Activity: Inactive (12/19/2021)   Exercise Vital Sign    Days of Exercise per Week: 0 days    Minutes of Exercise per Session: 0 min  Stress: No Stress Concern Present (12/19/2021)   [place]  of Stress : Not at all  Social Connections: Moderately Isolated (12/19/2021)   Social Connection and Isolation Panel [NHANES]    Frequency of Communication with Friends and Family: Twice a week    Frequency of Social Gatherings with Friends and Family: Once a week    Attends Religious Services: More than 4 times per year    Active Member of [employer] or Organizations: No    Attends Music therapist: Never    Marital Status: Divorced    Tobacco Counseling Counseling given: Not Answered   Clinical Intake:  Pre-visit preparation completed: Yes  Pain : No/denies pain     Nutritional Risks: None Diabetes: No  How often do you need to have someone help you when you read instructions, pamphlets, or other written materials from your doctor or pharmacy?: 1 - Never  Diabetic?no  Interpreter Needed?: No  Information entered by :: Kirke Shaggy, LPN   Activities of Daily Living    12/19/2021    9:07 AM  In your present state of health, do you have any difficulty performing the following activities:  Hearing? 0  Vision? 0  Difficulty concentrating or making decisions? 0  Walking or climbing stairs? 1  Dressing or bathing? 0  Doing errands, shopping? 0  Preparing Food and eating ? N  Using the Toilet? N  In the past six months, have you accidently leaked urine? N  Do you have problems with loss of bowel control? N   Managing your Medications? N  Managing your Finances? N  Housekeeping or managing your Housekeeping? N    Patient Care Team: [name], MD as PCP - General (Family Medicine)  Indicate any recent Medical Services you may have received from other than [facility] providers in the past year (date may be approximate).     Assessment:   This is a routine wellness examination for [facility].  Hearing/Vision screen Hearing Screening - Comments:: No aids Vision Screening - Comments:: Wears glasses-  Dietary issues and exercise activities discussed: Current Exercise Habits: The patient does not participate in regular exercise at present   Goals Addressed             This Visit's Progress    DIET - EAT MORE FRUITS AND VEGETABLES         Depression Screen    12/19/2021    9:03 AM 09/19/2021   12:44 PM 12/18/2020    6:19 PM 07/19/2020    9:58 AM 07/19/2020    8:38 AM 02/01/2020   10:07 AM 05/21/2017    9:09 AM  PHQ 2/9 Scores  PHQ - 2 Score '1 2 2 6 6 2 '$ 0  PHQ- 9 Score '3 3 3 20 20 5     '$ Fall Risk    12/19/2021    9:07 AM 09/19/2021   12:46 PM 08/22/2021    7:58 AM 03/28/2021   10:30 AM 12/26/2020    9:50 AM  Fall Risk   Falls in the past year? 1 1 0 1 0  Number falls in past yr: 0 1  0   Injury with Fall? 1 0  0   Risk for fall due to : History of fall(s)  Impaired balance/gait History of fall(s) Impaired mobility;Impaired balance/gait  Follow up Falls prevention discussed  Falls evaluation completed Falls evaluation completed Falls evaluation completed    FALL RISK PREVENTION PERTAINING TO THE HOME:  Any stairs in or around the home? No  If so, are there any  without handrails? No  Home free of loose throw rugs in walkways, pet beds, electrical cords, etc? Yes  Adequate lighting in your home to reduce risk of falls? Yes   ASSISTIVE DEVICES UTILIZED TO PREVENT FALLS:  Life alert? No  Use of a cane, walker or w/c? Yes  Grab bars in the bathroom? Yes  Shower chair or bench in  shower? Yes  Elevated toilet seat or a handicapped toilet? No   Cognitive Function:        12/19/2021    9:09 AM 12/18/2020    6:34 PM  6CIT Screen  What Year? 0 points 0 points  What month? 0 points 0 points  What time? 0 points 0 points  Count back from 20 0 points 0 points  Months in reverse 0 points 0 points  Repeat phrase 2 points 0 points  Total Score 2 points 0 points    Immunizations Immunization History  Administered Date(s) Administered   Fluad Quad(high Dose 65+) 02/01/2020   Influenza-Unspecified 02/03/2021   PFIZER(Purple Top)SARS-COV-2 Vaccination 05/15/2019, 06/07/2019, 02/03/2021   Pneumococcal Conjugate-13 04/26/2015   Pneumococcal Polysaccharide-23 03/09/2014   Tdap 09/21/2021    TDAP status: Up to date  Flu Vaccine status: Up to date  Pneumococcal vaccine status: Up to date  Covid-19 vaccine status: Completed vaccines  Qualifies for Shingles Vaccine? Yes   Zostavax completed No   Shingrix Completed?: No.    Education has been provided regarding the importance of this vaccine. Patient has been advised to call insurance company to determine out of pocket expense if they have not yet received this vaccine. Advised may also receive vaccine at local pharmacy or Health Dept. Verbalized acceptance and understanding.  Screening Tests Health Maintenance  Topic Date Due   Hepatitis C Screening  Never done   Zoster Vaccines- Shingrix (1 of 2) Never done   COVID-19 Vaccine (4 - Pfizer risk series) 03/31/2021   INFLUENZA VACCINE  11/20/2021   TETANUS/TDAP  09/22/2031   Pneumonia Vaccine 34+ Years old  Completed   HPV VACCINES  Aged Out   COLONOSCOPY (Pts 45-55yr Insurance coverage will need to be confirmed)  Discontinued    Health Maintenance  Health Maintenance Due  Topic Date Due   Hepatitis C Screening  Never done   Zoster Vaccines- Shingrix (1 of 2) Never done   COVID-19 Vaccine (4 - Pfizer risk series) 03/31/2021   INFLUENZA VACCINE  11/20/2021     Colorectal cancer screening: No longer required.   Lung Cancer Screening: (Low Dose CT Chest recommended if Age 78-80years, 30 pack-year currently smoking OR have quit w/in 15years.) does not qualify.    Additional Screening:  Hepatitis C Screening: does qualify; Completed no  Vision Screening: Recommended annual ophthalmology exams for early detection of glaucoma and other disorders of the eye. Is the patient up to date with their annual eye exam?  No  Who is the provider or what is the name of the office in which the patient attends annual eye exams? No one If pt is not established with a provider, would they like to be referred to a provider to establish care? No .   Dental Screening: Recommended annual dental exams for proper oral hygiene  Community Resource Referral / Chronic Care Management: CRR required this visit?  No   CCM required this visit?  No      Plan:     I have personally reviewed and noted the following in the patient's chart:   Medical  and social history Use of alcohol, tobacco or illicit drugs  Current medications and supplements including opioid prescriptions. Patient is not currently taking opioid prescriptions. Functional ability and status Nutritional status Physical activity Advanced directives List of other physicians Hospitalizations, surgeries, and ER visits in previous 12 months Vitals Screenings to include cognitive, depression, and falls Referrals and appointments  In addition, I have reviewed and discussed with patient certain preventive protocols, quality metrics, and best practice recommendations. A written personalized care plan for preventive services as well as general preventive health recommendations were provided to patient.     Thomas David, LPN   06/17/3333   Nurse Notes: none

## 2021-12-19 NOTE — Patient Instructions (Addendum)
[name] , Thank you for taking time to come for your Medicare Wellness Visit. I appreciate your ongoing commitment to your health goals. Please review the following plan we discussed and let me know if I can assist you in the future.   Screening recommendations/referrals: Colonoscopy: aged out Recommended yearly ophthalmology/optometry visit for glaucoma screening and checkup Recommended yearly dental visit for hygiene and checkup  Vaccinations: Influenza vaccine: 02/03/21 Pneumococcal vaccine: 04/26/15 Tdap vaccine: 09/21/21 Shingles vaccine: n/d   Covid-19: 05/15/19, 06/07/19, 02/03/21  Advanced directives: yes  Conditions/risks identified: none  Next appointment: Follow up in one year for your annual wellness visit. - 12/24/22 @ 9 am by phone  Preventive Care 65 Years and Older, Male Preventive care refers to lifestyle choices and visits with your health care provider that can promote health and wellness. What does preventive care include? A yearly physical exam. This is also called an annual well check. Dental exams once or twice a year. Routine eye exams. Ask your health care provider how often you should have your eyes checked. Personal lifestyle choices, including: Daily care of your teeth and gums. Regular physical activity. Eating a healthy diet. Avoiding tobacco and drug use. Limiting alcohol use. Practicing safe sex. Taking low doses of aspirin every day. Taking vitamin and mineral supplements as recommended by your health care provider. What happens during an annual well check? The services and screenings done by your health care provider during your annual well check will depend on your age, overall health, lifestyle risk factors, and family history of disease. Counseling  Your health care provider may ask you questions about your: Alcohol use. Tobacco use. Drug use. Emotional well-being. Home and relationship well-being. Sexual activity. Eating  habits. History of falls. Memory and ability to understand (cognition). Work and work Statistician. Screening  You may have the following tests or measurements: Height, weight, and BMI. Blood pressure. Lipid and cholesterol levels. These may be checked every 5 years, or more frequently if you are over 50 years old. Skin check. Lung cancer screening. You may have this screening every year starting at age 78 if you have a 30-pack-year history of smoking and currently smoke or have quit within the past 15 years. Fecal occult blood test (FOBT) of the stool. You may have this test every year starting at age 78. Flexible sigmoidoscopy or colonoscopy. You may have a sigmoidoscopy every 5 years or a colonoscopy every 10 years starting at age 78. Prostate cancer screening. Recommendations will vary depending on your family history and other risks. Hepatitis C blood test. Hepatitis B blood test. Sexually transmitted disease (STD) testing. Diabetes screening. This is done by checking your blood sugar (glucose) after you have not eaten for a while (fasting). You may have this done every 1-3 years. Abdominal aortic aneurysm (AAA) screening. You may need this if you are a current or former smoker. Osteoporosis. You may be screened starting at age 78 if you are at high risk. Talk with your health care provider about your test results, treatment options, and if necessary, the need for more tests. Vaccines  Your health care provider may recommend certain vaccines, such as: Influenza vaccine. This is recommended every year. Tetanus, diphtheria, and acellular pertussis (Tdap, Td) vaccine. You may need a Td booster every 10 years. Zoster vaccine. You may need this after age 78. Pneumococcal 13-valent conjugate (PCV13) vaccine. One dose is recommended after age 78. Pneumococcal polysaccharide (PPSV23) vaccine. One dose is recommended after age 78. Talk to your  health care provider about which screenings and  vaccines you need and how often you need them. This information is not intended to replace advice given to you by your health care provider. Make sure you discuss any questions you have with your health care provider. Document Released: 05/05/2015 Document Revised: 12/27/2015 Document Reviewed: 02/07/2015 Elsevier Interactive Patient Education  2017 [employer] Prevention in the Home Falls can cause injuries. They can happen to people of all ages. There are many things you can do to make your home safe and to help prevent falls. What can I do on the outside of my home? Regularly fix the edges of walkways and driveways and fix any cracks. Remove anything that might make you trip as you walk through a door, such as a raised step or threshold. Trim any bushes or trees on the path to your home. Use bright outdoor lighting. Clear any walking paths of anything that might make someone trip, such as rocks or tools. Regularly check to see if handrails are loose or broken. Make sure that both sides of any steps have handrails. Any raised decks and porches should have guardrails on the edges. Have any leaves, snow, or ice cleared regularly. Use sand or salt on walking paths during winter. Clean up any spills in your garage right away. This includes oil or grease spills. What can I do in the bathroom? Use night lights. Install grab bars by the toilet and in the tub and shower. Do not use towel bars as grab bars. Use non-skid mats or decals in the tub or shower. If you need to sit down in the shower, use a plastic, non-slip stool. Keep the floor dry. Clean up any water that spills on the floor as soon as it happens. Remove soap buildup in the tub or shower regularly. Attach bath mats securely with double-sided non-slip rug tape. Do not have throw rugs and other things on the floor that can make you trip. What can I do in the bedroom? Use night lights. Make sure that you have a light by your  bed that is easy to reach. Do not use any sheets or blankets that are too big for your bed. They should not hang down onto the floor. Have a firm chair that has side arms. You can use this for support while you get dressed. Do not have throw rugs and other things on the floor that can make you trip. What can I do in the kitchen? Clean up any spills right away. Avoid walking on wet floors. Keep items that you use a lot in easy-to-reach places. If you need to reach something above you, use a strong step stool that has a grab bar. Keep electrical cords out of the way. Do not use floor polish or wax that makes floors slippery. If you must use wax, use non-skid floor wax. Do not have throw rugs and other things on the floor that can make you trip. What can I do with my stairs? Do not leave any items on the stairs. Make sure that there are handrails on both sides of the stairs and use them. Fix handrails that are broken or loose. Make sure that handrails are as long as the stairways. Check any carpeting to make sure that it is firmly attached to the stairs. Fix any carpet that is loose or worn. Avoid having throw rugs at the top or bottom of the stairs. If you do have throw rugs, attach them  to the floor with carpet tape. Make sure that you have a light switch at the top of the stairs and the bottom of the stairs. If you do not have them, ask someone to add them for you. What else can I do to help prevent falls? Wear shoes that: Do not have high heels. Have rubber bottoms. Are comfortable and fit you well. Are closed at the toe. Do not wear sandals. If you use a stepladder: Make sure that it is fully opened. Do not climb a closed stepladder. Make sure that both sides of the stepladder are locked into place. Ask someone to hold it for you, if possible. Clearly mark and make sure that you can see: Any grab bars or handrails. First and last steps. Where the edge of each step is. Use tools that  help you move around (mobility aids) if they are needed. These include: Canes. Walkers. Scooters. Crutches. Turn on the lights when you go into a dark area. Replace any light bulbs as soon as they burn out. Set up your furniture so you have a clear path. Avoid moving your furniture around. If any of your floors are uneven, fix them. If there are any pets around you, be aware of where they are. Review your medicines with your doctor. Some medicines can make you feel dizzy. This can increase your chance of falling. Ask your doctor what other things that you can do to help prevent falls. This information is not intended to replace advice given to you by your health care provider. Make sure you discuss any questions you have with your health care provider. Document Released: 02/02/2009 Document Revised: 09/14/2015 Document Reviewed: 05/13/2014 Elsevier Interactive Patient Education  2017 [employer].

## 2021-12-20 DIAGNOSIS — C61 Malignant neoplasm of prostate: Secondary | ICD-10-CM

## 2021-12-20 DIAGNOSIS — E785 Hyperlipidemia, unspecified: Secondary | ICD-10-CM | POA: Diagnosis not present

## 2021-12-27 ENCOUNTER — Ambulatory Visit: Payer: No Typology Code available for payment source | Admitting: Family Medicine

## 2022-01-04 ENCOUNTER — Ambulatory Visit: Payer: No Typology Code available for payment source | Admitting: Nurse Practitioner

## 2022-01-07 ENCOUNTER — Ambulatory Visit
Admission: RE | Admit: 2022-01-07 | Discharge: 2022-01-07 | Disposition: A | Discharge status: Home / Self Care | Payer: No Typology Code available for payment source | Source: Ambulatory Visit | Attending: Radiation Oncology | Admitting: Radiation Oncology

## 2022-01-07 VITALS — BP 156/84 | HR 88 | Temp 96.3°F | Resp 18 | Wt 176.3 lb

## 2022-01-07 DIAGNOSIS — Z923 Personal history of irradiation: Secondary | ICD-10-CM | POA: Insufficient documentation

## 2022-01-07 DIAGNOSIS — C61 Malignant neoplasm of prostate: Secondary | ICD-10-CM | POA: Insufficient documentation

## 2022-01-07 DIAGNOSIS — Z191 Hormone sensitive malignancy status: Secondary | ICD-10-CM | POA: Diagnosis not present

## 2022-01-07 NOTE — Progress Notes (Signed)
Radiation Oncology Follow up Note  Name: Thomas [name]   Date:   01/07/2022 MRN:  [id number] DOB: 1945-05-05    This 76 y.o. male presents to the clinic today for 8-1/2-year follow-up status post I-125 interstitial implant for Gleason 6 adenocarcinoma presenting with a PSA of 8.7.  REFERRING PROVIDER: [name], MD  HPI: Patient is a 76 year old male now about 8-1/2 years having completed I-125 interstitial implant for adenocarcinoma the prostate.Marland Kitchen  He is currently on ADT therapy for biochemical recurrence of his prostate cancer being 4.7 back in March 2023.  He had a traumatic fall causing some neck pain otherwise is asymptomatic.  Specifically denies any increased lower urinary tract symptoms.  COMPLICATIONS OF TREATMENT: none  FOLLOW UP COMPLIANCE: keeps appointments   PHYSICAL EXAM:  BP (!) 156/84   Pulse 88   Temp (!) 96.3 F (35.7 C)   Resp 18   Wt 176 lb 4.8 oz (80 kg)   BMI 31.23 kg/m  Well-developed well-nourished patient in NAD. HEENT reveals PERLA, EOMI, discs not visualized.  Oral cavity is clear. No oral mucosal lesions are identified. Neck is clear without evidence of cervical or supraclavicular adenopathy. Lungs are clear to A&P. Cardiac examination is essentially unremarkable with regular rate and rhythm without murmur rub or thrill. Abdomen is benign with no organomegaly or masses noted. Motor sensory and DTR levels are equal and symmetric in the upper and lower extremities. Cranial nerves II through XII are grossly intact. Proprioception is intact. No peripheral adenopathy or edema is identified. No motor or sensory levels are noted. Crude visual fields are within normal range.  RADIOLOGY RESULTS: No current films to review  PLAN: Present time he is receiving ADT therapy through medical oncology.  I am turning follow-up care over to medical oncology.  I would happy to reevaluate the patient anytime should further radiation treatment be indicated.  Patient is  to call with any concerns.  I would like to take this opportunity to thank you for allowing me to participate in the care of your patient.Noreene Filbert, MD

## 2022-01-08 ENCOUNTER — Other Ambulatory Visit: Payer: Self-pay | Admitting: Family Medicine

## 2022-01-09 ENCOUNTER — Other Ambulatory Visit: Payer: Self-pay

## 2022-01-09 DIAGNOSIS — C61 Malignant neoplasm of prostate: Secondary | ICD-10-CM

## 2022-01-11 ENCOUNTER — Inpatient Hospital Stay: Payer: No Typology Code available for payment source | Attending: Oncology

## 2022-01-11 ENCOUNTER — Inpatient Hospital Stay: Payer: No Typology Code available for payment source

## 2022-01-11 ENCOUNTER — Encounter: Payer: Self-pay | Admitting: Oncology

## 2022-01-11 ENCOUNTER — Inpatient Hospital Stay (HOSPITAL_BASED_OUTPATIENT_CLINIC_OR_DEPARTMENT_OTHER): Payer: No Typology Code available for payment source | Admitting: Oncology

## 2022-01-11 VITALS — BP 125/64 | HR 81 | Temp 97.7°F | Resp 16 | Ht 63.0 in | Wt 169.1 lb

## 2022-01-11 DIAGNOSIS — K219 Gastro-esophageal reflux disease without esophagitis: Secondary | ICD-10-CM | POA: Insufficient documentation

## 2022-01-11 DIAGNOSIS — C61 Malignant neoplasm of prostate: Secondary | ICD-10-CM | POA: Diagnosis not present

## 2022-01-11 DIAGNOSIS — Z79818 Long term (current) use of other agents affecting estrogen receptors and estrogen levels: Secondary | ICD-10-CM | POA: Insufficient documentation

## 2022-01-11 DIAGNOSIS — Z79899 Other long term (current) drug therapy: Secondary | ICD-10-CM | POA: Insufficient documentation

## 2022-01-11 DIAGNOSIS — Z87891 Personal history of nicotine dependence: Secondary | ICD-10-CM | POA: Insufficient documentation

## 2022-01-11 DIAGNOSIS — M549 Dorsalgia, unspecified: Secondary | ICD-10-CM | POA: Diagnosis not present

## 2022-01-11 DIAGNOSIS — E785 Hyperlipidemia, unspecified: Secondary | ICD-10-CM | POA: Diagnosis not present

## 2022-01-11 DIAGNOSIS — Z7951 Long term (current) use of inhaled steroids: Secondary | ICD-10-CM | POA: Diagnosis not present

## 2022-01-11 DIAGNOSIS — E039 Hypothyroidism, unspecified: Secondary | ICD-10-CM | POA: Diagnosis not present

## 2022-01-11 DIAGNOSIS — Z5181 Encounter for therapeutic drug level monitoring: Secondary | ICD-10-CM | POA: Diagnosis not present

## 2022-01-11 LAB — CBC WITH DIFFERENTIAL/PLATELET
Abs Immature Granulocytes: 0.03 10*3/uL (ref 0.00–0.07)
Basophils Absolute: 0 10*3/uL (ref 0.0–0.1)
Basophils Relative: 1 %
Eosinophils Absolute: 0.4 10*3/uL (ref 0.0–0.5)
Eosinophils Relative: 8 %
HCT: 40.7 % (ref 39.0–52.0)
Hemoglobin: 14 g/dL (ref 13.0–17.0)
Immature Granulocytes: 1 %
Lymphocytes Relative: 27 %
Lymphs Abs: 1.2 10*3/uL (ref 0.7–4.0)
MCH: 33.3 pg (ref 26.0–34.0)
MCHC: 34.4 g/dL (ref 30.0–36.0)
MCV: 96.9 fL (ref 80.0–100.0)
Monocytes Absolute: 0.3 10*3/uL (ref 0.1–1.0)
Monocytes Relative: 8 %
Neutro Abs: 2.4 10*3/uL (ref 1.7–7.7)
Neutrophils Relative %: 55 %
Platelets: 192 10*3/uL (ref 150–400)
RBC: 4.2 MIL/uL — ABNORMAL LOW (ref 4.22–5.81)
RDW: 12.6 % (ref 11.5–15.5)
WBC: 4.4 10*3/uL (ref 4.0–10.5)
nRBC: 0 % (ref 0.0–0.2)

## 2022-01-11 LAB — COMPREHENSIVE METABOLIC PANEL
ALT: 15 U/L (ref 0–44)
AST: 19 U/L (ref 15–41)
Albumin: 3.8 g/dL (ref 3.5–5.0)
Alkaline Phosphatase: 85 U/L (ref 38–126)
Anion gap: 5 (ref 5–15)
BUN: 10 mg/dL (ref 8–23)
CO2: 28 mmol/L (ref 22–32)
Calcium: 8.6 mg/dL — ABNORMAL LOW (ref 8.9–10.3)
Chloride: 113 mmol/L — ABNORMAL HIGH (ref 98–111)
Creatinine, Ser: 0.99 mg/dL (ref 0.61–1.24)
GFR, Estimated: >60 mL/min (ref >60)
Glucose, Bld: 109 mg/dL — ABNORMAL HIGH (ref 70–99)
Potassium: 3.5 mmol/L (ref 3.5–5.1)
Sodium: 146 mmol/L — ABNORMAL HIGH (ref 135–145)
Total Bilirubin: 0.3 mg/dL (ref 0.3–1.2)
Total Protein: 6.8 g/dL (ref 6.5–8.1)

## 2022-01-11 LAB — PSA: Prostatic Specific Antigen: 0.19 ng/mL (ref 0.00–4.00)

## 2022-01-11 MED ORDER — LEUPROLIDE ACETATE (6 MONTH) 45 MG ~~LOC~~ KIT
45.0000 mg | PACK | SUBCUTANEOUS | Status: AC
  Administered 2022-01-11: 45 mg via SUBCUTANEOUS
  Filled 2022-01-11: qty 45

## 2022-01-11 NOTE — Progress Notes (Signed)
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  I do not have the biopsy report from 7 years ago.  He received I-125 interstitial implant for the same.  His presenting PSA at that time was 8.7.  He was last given a dose of Eligard in March 2021 following which his PSA dropped down from 5.28-0.27.  He did not receive any Eligard since then and PSA went back up to 4.7   He has been referred to medical oncology for consideration of restarting ADT.   Lupron restarted in March 2022  Interval history-patient has baseline fatigue and chronic back pain.  Denies any new complaints at this time  ECOG PS- 2 Pain scale- 3 Opioid associated constipation- no  Review of systems- Review of Systems  Constitutional:  Positive for malaise/fatigue. Negative for chills, fever and weight loss.  HENT:  Negative for congestion, ear discharge and nosebleeds.   Eyes:  Negative for blurred vision.  Respiratory:  Negative for cough, hemoptysis, sputum production, shortness of breath and wheezing.   Cardiovascular:  Negative for chest pain, palpitations, orthopnea and claudication.  Gastrointestinal:  Negative for abdominal pain, blood in stool, constipation, diarrhea, heartburn, melena, nausea and vomiting.  Genitourinary:  Negative for dysuria, flank pain, frequency, hematuria and urgency.  Musculoskeletal:  Positive for back pain. Negative for joint pain and myalgias.  Skin:  Negative for rash.  Neurological:   Negative for dizziness, tingling, focal weakness, seizures, weakness and headaches.  Endo/Heme/Allergies:  Does not bruise/bleed easily.  Psychiatric/Behavioral:  Negative for depression and suicidal ideas. The patient does not have insomnia.       Allergies  Allergen Reactions   Augmentin [Amoxicillin-Pot Clavulanate]     Gi upset   Ciprofloxacin    Nsaids Other (See Comments)    Stomach ulcer    Rosuvastatin     Myalgias     Past Medical History:  Diagnosis Date   Allergy    trees and plants   Anemia    IDA   Anxiety    Asthma 1998   Diverticulitis    Elevated PSA    GERD (gastroesophageal reflux disease)    Hyperlipidemia    Hypothyroidism 03/16/2019   Prostate cancer [facility])      Past Surgical History:  Procedure Laterality Date   HERNIA REPAIR  1999   prostate cancer     TRANSURETHRAL RESECTION OF PROSTATE     VASECTOMY      Social History   Socioeconomic History   Marital status: Married    Spouse name: Not on file   Number of children: Not on file   Years of education: Not on file   Highest education level: Not on file  Occupational History   Not on file  Tobacco Use   Smoking status: Former    Types: Cigarettes    Quit date: 04/22/1982    Years since quitting: 39.7   Smokeless tobacco: Never  Vaping Use   Vaping Use: Never used  Substance and Sexual Activity   Alcohol  use: Yes    Alcohol/week: 2.0 standard drinks of alcohol    Types: 2 Shots of liquor per week    Comment: bourbon every night   Drug use: Not Currently   Sexual activity: Not Currently  Other Topics Concern   Not on file  Social History Narrative   Not on file   Social Determinants of Health   Financial Resource Strain: Low Risk  (12/19/2021)   Overall Financial Resource Strain (CARDIA)    Difficulty of Paying Living Expenses: Not hard at all  Food Insecurity: No Food Insecurity (12/19/2021)   Hunger Vital Sign    Worried About Running Out of Food in the Last Year: Never  true    Ran Out of Food in the Last Year: Never true  Transportation Needs: No Transportation Needs (12/19/2021)   PRAPARE - Hydrologist (Medical): No    Lack of Transportation (Non-Medical): No  Physical Activity: Inactive (12/19/2021)   Exercise Vital Sign    Days of Exercise per Week: 0 days    Minutes of Exercise per Session: 0 min  Stress: No Stress Concern Present (12/19/2021)   [place]    Feeling of Stress : Not at all  Social Connections: Moderately Isolated (12/19/2021)   Social Connection and Isolation Panel [NHANES]    Frequency of Communication with Friends and Family: Twice a week    Frequency of Social Gatherings with Friends and Family: Once a week    Attends Religious Services: More than 4 times per year    Active Member of [employer] or Organizations: No    Attends Archivist Meetings: Never    Marital Status: Divorced  Human resources officer Violence: Not At Risk (12/19/2021)   Humiliation, Afraid, Rape, and Kick questionnaire    Fear of Current or Ex-Partner: No    Emotionally Abused: No    Physically Abused: No    Sexually Abused: No    Family History  Problem Relation Age of Onset   Heart disease Father    Hypertension Brother    Diabetes Brother      Current Outpatient Medications:    acetaminophen (TYLENOL) 500 MG tablet, Take 500 mg by mouth every 6 (six) hours as needed., Disp: , Rfl:    albuterol (VENTOLIN HFA) 108 (90 Base) MCG/ACT inhaler, TAKE 2 PUFFS BY MOUTH EVERY 6 HOURS AS NEEDED FOR WHEEZE, Disp: 18 g, Rfl: 1   Ascorbic Acid (VITAMIN C) 100 MG tablet, Take 100 mg by mouth daily., Disp: , Rfl:    BREO ELLIPTA 200-25 MCG/ACT AEPB, INHALE 1 PUFF BY MOUTH ONCE DAILY IN THE MORNING, Disp: 60 each, Rfl: 3   busPIRone (BUSPAR) 10 MG tablet, Take 1 tablet by mouth twice daily, Disp: 60 tablet, Rfl: 4   co-enzyme Q-10 30 MG capsule, Take 30 mg by mouth  daily., Disp: , Rfl:    ferrous sulfate 325 (65 FE) MG tablet, Take 325 mg by mouth daily with breakfast., Disp: , Rfl:    fexofenadine (ALLEGRA) 180 MG tablet, Take 180 mg by mouth daily., Disp: , Rfl:    FIBER PO, Take 1 tablet by mouth daily., Disp: , Rfl:    fluticasone (FLONASE) 50 MCG/ACT nasal spray, Place 2 sprays into both nostrils daily., Disp: 16 g, Rfl: 4   levothyroxine (SYNTHROID) 50 MCG tablet, Take 1 1/2 tablets on M W Fri Sat Sun and take 1 on Tues and Thursday, Disp: 120  tablet, Rfl: 5   Melatonin 1 MG TABS, Take 5 mg by mouth at bedtime as needed., Disp: , Rfl:    Multiple Vitamins-Minerals (MULTIVITAMIN WITH MINERALS) tablet, Take 1 tablet by mouth daily., Disp: , Rfl:    nitroGLYCERIN (NITROSTAT) 0.4 MG SL tablet, Place 1 tablet (0.4 mg total) under the tongue every 5 (five) minutes as needed for chest pain., Disp: 30 tablet, Rfl: 5   Omega-3 Fatty Acids (FISH OIL) 1000 MG CAPS, Take 1 capsule by mouth daily., Disp: , Rfl:    pantoprazole (PROTONIX) 40 MG tablet, Take 1 tablet (40 mg total) by mouth daily., Disp: 30 tablet, Rfl: 5   Red Yeast Rice Extract 300 MG CAPS, Take 1 tablet by mouth daily., Disp: , Rfl:    ezetimibe (ZETIA) 10 MG tablet, Take 1 tablet (10 mg total) by mouth daily. (Patient not taking: Reported on 12/19/2021), Disp: 30 tablet, Rfl: 5   triamcinolone cream (KENALOG) 0.1 %, Apply 1 Application topically 2 (two) times daily. (Patient not taking: Reported on 01/11/2022), Disp: 30 g, Rfl: 0 No current facility-administered medications for this visit.  Facility-Administered Medications Ordered in Other Visits:    leuprolide (6 Month) (ELIGARD) injection 45 mg, 45 mg, Subcutaneous, Q6 months, Sindy Guadeloupe, MD, 45 mg at 01/11/22 1206  Physical exam:  Vitals:   01/11/22 1147  BP: 125/64  Pulse: 81  Resp: 16  Temp: 97.7 F (36.5 C)  TempSrc: Oral  Weight: 169 lb 1.6 oz (76.7 kg)  Height: '5\' 3"'$  (1.6 m)   Physical Exam Constitutional:      General: He  is not in acute distress. Cardiovascular:     Rate and Rhythm: Normal rate and regular rhythm.     Heart sounds: Normal heart sounds.  Pulmonary:     Effort: Pulmonary effort is normal.     Breath sounds: Normal breath sounds.  Abdominal:     General: Bowel sounds are normal.     Palpations: Abdomen is soft.  Skin:    General: Skin is warm and dry.  Neurological:     Mental Status: He is alert and oriented to person, place, and time.         Latest Ref Rng & Units 01/11/2022   11:19 AM  CMP  Glucose 70 - 99 mg/dL 109   BUN 8 - 23 mg/dL 10   Creatinine 0.61 - 1.24 mg/dL 0.99   Sodium 135 - 145 mmol/L 146   Potassium 3.5 - 5.1 mmol/L 3.5   Chloride 98 - 111 mmol/L 113   CO2 22 - 32 mmol/L 28   Calcium 8.9 - 10.3 mg/dL 8.6   Total Protein 6.5 - 8.1 g/dL 6.8   Total Bilirubin 0.3 - 1.2 mg/dL 0.3   Alkaline Phos 38 - 126 U/L 85   AST 15 - 41 U/L 19   ALT 0 - 44 U/L 15       Latest Ref Rng & Units 01/11/2022   11:19 AM  CBC  WBC 4.0 - 10.5 K/uL 4.4   Hemoglobin 13.0 - 17.0 g/dL 14.0   Hematocrit 39.0 - 52.0 % 40.7   Platelets 150 - 400 K/uL 192      Assessment and plan- Patient is a 76 y.o. male with castrate sensitive nonmetastatic prostate cancer here for routine follow-up  Patient will receive hisLupron today and he has been getting this every 6 months.  PSA so far has been downtrending and the last level 6 months ago was  0.27.  Levels from today are pending.  Patient has had longstanding back pain.  I will obtain a baseline bone scan at this time  I will see him back in 6 months with CBC with differential CMP and PSA.  I will also check his bone density scan prior to his next visit   Visit Diagnosis 1. Malignant neoplasm of prostate ([place])   2. Encounter for monitoring Lupron therapy      Dr. Randa Evens, MD, MPH [facility] [phone] 01/11/2022 2:45 PM

## 2022-01-14 ENCOUNTER — Telehealth: Payer: Self-pay | Admitting: *Deleted

## 2022-01-14 NOTE — Telephone Encounter (Signed)
Patient called and left message that he is having flu like symptoms after receiving his injection last week. I called to get more specifics as to his symptoms and got voice mail. Left message to call me back

## 2022-01-14 NOTE — Telephone Encounter (Signed)
Patient called back and reports that he developed diarrhea for a day and a half after her got his Lupron shot. He has been getting this for over a yr=ear and has not had this happen before. He states that he is fine now. He went and got some diarrhea medicine and he is fine now. I told him to call back if he has any further problems

## 2022-01-22 ENCOUNTER — Ambulatory Visit: Payer: Self-pay | Admitting: Family Medicine

## 2022-01-25 ENCOUNTER — Encounter: Payer: Self-pay | Admitting: Family Medicine

## 2022-01-25 ENCOUNTER — Ambulatory Visit (INDEPENDENT_AMBULATORY_CARE_PROVIDER_SITE_OTHER): Payer: No Typology Code available for payment source | Admitting: Family Medicine

## 2022-01-25 DIAGNOSIS — R21 Rash and other nonspecific skin eruption: Secondary | ICD-10-CM

## 2022-01-25 MED ORDER — MUPIROCIN 2 % EX OINT: 1.0000 | TOPICAL_OINTMENT | Freq: Two times a day (BID) | CUTANEOUS | 0 refills | Status: AC

## 2022-01-25 MED ORDER — PREDNISONE 10 MG PO TABS: ORAL_TABLET | ORAL | 0 refills | Status: DC

## 2022-01-25 NOTE — Patient Instructions (Signed)
Medication as prescribed.  If this continues to persist or does not improve please let us know.  Take care  Dr. Lacinda Axon

## 2022-01-25 NOTE — Assessment & Plan Note (Signed)
Given clinical exam, I am concerned for poison oak or poison ivy.  There is no evidence of tinea pedis.  Treating with prednisone.  Bactroban ointment as well to prevent secondary infection.

## 2022-01-25 NOTE — Progress Notes (Signed)
Subjective:  Patient ID: Thomas [name], male    DOB: 1945-06-03  Age: 76 y.o. MRN: [id number]  CC: Chief Complaint  Patient presents with   Foot Swelling    Pt right foot swelling. Pt believes he has athletes feet; use athletic foot spray. Pt cleaned out a building and states that he was bit by bugs and was stung by wasp.     HPI:  76 year old male presents for evaluation of the above.  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  He has had several insect bites over the past couple of weeks.  He states that over this period of time he has now developed a rash on the dorsum of his feet.  He states that it has been slightly swollen and erythematous.  He states that he felt that this was related to athlete's foot and has been using an over-the-counter spray without resolution.  There is some discharge from the area.  No other associated symptoms.  No other complaints.  Patient Active Problem List   Diagnosis Date Noted   Rash 01/25/2022   Myalgia due to statin 08/22/2021   Chronic bilateral low back pain without sciatica 08/22/2020   Malignant neoplasm of prostate ([place]) 06/30/2020   Hypothyroidism 03/16/2019   GERD (gastroesophageal reflux disease) 11/29/2015   Insomnia 11/29/2015   Prostate cancer ([place]) 12/30/2014   Hyperlipidemia 12/30/2014   Anemia 07/19/2013    Social Hx   Social History   Socioeconomic History   Marital status: Married    Spouse name: Not on file   Number of children: Not on file   Years of education: Not on file   Highest education level: Not on file  Occupational History   Not on file  Tobacco Use   Smoking status: Former    Types: Cigarettes    Quit date: 04/22/1982    Years since quitting: 39.7   Smokeless tobacco: Never  Vaping Use   Vaping Use: Never used  Substance and Sexual Activity   Alcohol use: Yes    Alcohol/week: 2.0 standard drinks of alcohol    Types: 2 Shots of liquor per week     Comment: bourbon every night   Drug use: Not Currently   Sexual activity: Not Currently  Other Topics Concern   Not on file  Social History Narrative   Not on file   Social Determinants of Health   Financial Resource Strain: Low Risk  (12/19/2021)   Overall Financial Resource Strain (CARDIA)    Difficulty of Paying Living Expenses: Not hard at all  Food Insecurity: No Food Insecurity (12/19/2021)   Hunger Vital Sign    Worried About Running Out of Food in the Last Year: Never true    Ran Out of Food in the Last Year: Never true  Transportation Needs: No Transportation Needs (12/19/2021)   PRAPARE - Hydrologist (Medical): No    Lack of Transportation (Non-Medical): No  Physical Activity: Inactive (12/19/2021)   Exercise Vital Sign    Days of Exercise per Week: 0 days    Minutes of Exercise per Session: 0 min  Stress: No Stress Concern Present (12/19/2021)   [place]    Feeling of Stress : Not at all  Social Connections: Moderately Isolated (12/19/2021)   Social Connection and Isolation Panel [NHANES]    Frequency of Communication with Friends and Family: Twice a week  Frequency of Social Gatherings with Friends and Family: Once a week    Attends Religious Services: More than 4 times per year    Active Member of [employer] or Organizations: No    Attends Music therapist: Never    Marital Status: Divorced    Review of Systems Per HPI  Objective:  BP 132/86   Pulse 64   Temp 97.7 F (36.5 C)   Wt 173 lb 9.6 oz (78.7 kg)   SpO2 100%   BMI 30.75 kg/m      01/25/2022    8:29 AM 01/11/2022   11:47 AM 01/07/2022    9:06 AM  BP/Weight  Systolic BP 893 810 175  Diastolic BP 86 64 84  Wt. (Lbs) 173.6 169.1 176.3  BMI 30.75 kg/m2 29.95 kg/m2 31.23 kg/m2    Physical Exam Constitutional:      General: He is not in acute distress.    Appearance: Normal appearance.   HENT:     Head: Normocephalic and atraumatic.  Pulmonary:     Effort: Pulmonary effort is normal. No respiratory distress.  Musculoskeletal:       Feet:  Feet:     Comments: Vesicular rash noted at the labeled location.  Mild erythema.  There is drainage from the area particularly on the right foot.  No interdigital maceration. Skin:    Comments: Scattered erythematous papules with evidence of excoriation on the lower extremities bilaterally.  Neurological:     Mental Status: He is alert.  Psychiatric:        Mood and Affect: Mood normal.        Behavior: Behavior normal.     Lab Results  Component Value Date   WBC 4.4 01/11/2022   HGB 14.0 01/11/2022   HCT 40.7 01/11/2022   PLT 192 01/11/2022   GLUCOSE 109 (H) 01/11/2022   CHOL 242 (H) 08/20/2021   TRIG 133 08/20/2021   HDL 78 08/20/2021   LDLCALC 141 (H) 08/20/2021   ALT 15 01/11/2022   AST 19 01/11/2022   NA 146 (H) 01/11/2022   K 3.5 01/11/2022   CL 113 (H) 01/11/2022   CREATININE 0.99 01/11/2022   BUN 10 01/11/2022   CO2 28 01/11/2022   TSH 2.490 08/20/2021   PSA 2.40 05/20/2016     Assessment & Plan:   Problem List Items Addressed This Visit       Musculoskeletal and Integument   Rash    Given clinical exam, I am concerned for poison oak or poison ivy.  There is no evidence of tinea pedis.  Treating with prednisone.  Bactroban ointment as well to prevent secondary infection.       Meds ordered this encounter  Medications   predniSONE (DELTASONE) 10 MG tablet    Sig: 50 mg daily x 3 days, then 40 mg daily x 3 days, then 30 mg daily x 3 days, then 20 mg daily x 3 days, then 10 mg daily x 3 days.    Dispense:  45 tablet    Refill:  0   mupirocin ointment (BACTROBAN) 2 %    Sig: Apply 1 Application topically 2 (two) times daily for 7 days.    Dispense:  30 g    Refill:  0    Follow-up: Already has follow-up with his primary care provider.  He is to let us know if he fails improve or  worsens.  [place]

## 2022-02-03 ENCOUNTER — Other Ambulatory Visit: Payer: Self-pay | Admitting: Family Medicine

## 2022-02-22 ENCOUNTER — Ambulatory Visit (INDEPENDENT_AMBULATORY_CARE_PROVIDER_SITE_OTHER): Payer: No Typology Code available for payment source | Admitting: Family Medicine

## 2022-02-22 DIAGNOSIS — Z23 Encounter for immunization: Secondary | ICD-10-CM

## 2022-02-22 DIAGNOSIS — F321 Major depressive disorder, single episode, moderate: Secondary | ICD-10-CM

## 2022-02-22 MED ORDER — NITROGLYCERIN 0.4 MG SL SUBL: 0.4000 mg | SUBLINGUAL_TABLET | SUBLINGUAL | 5 refills | Status: DC | PRN

## 2022-02-22 MED ORDER — SERTRALINE HCL 50 MG PO TABS: 50.0000 mg | ORAL_TABLET | Freq: Every day | ORAL | 3 refills | Status: DC

## 2022-02-22 NOTE — Progress Notes (Signed)
   Subjective:    Patient ID: Thomas [name], male    DOB: 29-Oct-1945, 76 y.o.   MRN: [id number]  HPI 20 minutes spent with patient discussing depression he relates a lot of fatigue tiredness not sleeping as well finding himself feeling rundown sad depressed related into separation from his wife over a year ago unlikely that they will get back together but he is hopeful that they will patient at times thinks about dying but he does not want to hurt himself and states he is not suicidal.   Review of Systems     Objective:   Physical Exam  No exam today all of this was discussion     Assessment & Plan:   Major pression Antidepressant sertraline Patient does have church friends that he talks to He will do a follow-up visit within 4 weeks To follow-up sooner if any problems. Patient reassures me that he is not thinking about hurting himself

## 2022-02-25 ENCOUNTER — Ambulatory Visit: Payer: No Typology Code available for payment source | Admitting: Family Medicine

## 2022-03-18 ENCOUNTER — Telehealth: Payer: Self-pay

## 2022-03-18 MED ORDER — SERTRALINE HCL 50 MG PO TABS: 50.0000 mg | ORAL_TABLET | Freq: Every day | ORAL | 3 refills | Status: DC

## 2022-03-18 NOTE — Telephone Encounter (Signed)
Encourage patient to contact the pharmacy for refills or they can request refills through [facility]  (Please schedule appointment if patient has not been seen in over a year)    [place] TO: [place], [place] [address]   MEDICATION NAME & DOSE:sertraline (ZOLOFT) 50 MG tablet   NOTES/COMMENTS FROM PAT     Front office please notify patient: It takes 48-72 hours to process rx refill requests Ask patient to call pharmacy to ensure rx is ready before heading there.

## 2022-03-25 ENCOUNTER — Ambulatory Visit: Payer: No Typology Code available for payment source | Admitting: Family Medicine

## 2022-04-01 ENCOUNTER — Encounter: Payer: Self-pay | Admitting: Family Medicine

## 2022-04-01 ENCOUNTER — Ambulatory Visit (INDEPENDENT_AMBULATORY_CARE_PROVIDER_SITE_OTHER): Payer: No Typology Code available for payment source | Admitting: Family Medicine

## 2022-04-01 VITALS — BP 123/76 | Wt 169.6 lb

## 2022-04-01 DIAGNOSIS — F324 Major depressive disorder, single episode, in partial remission: Secondary | ICD-10-CM

## 2022-04-01 MED ORDER — SERTRALINE HCL 100 MG PO TABS: 100.0000 mg | ORAL_TABLET | Freq: Every day | ORAL | 4 refills | Status: DC

## 2022-04-01 NOTE — Progress Notes (Signed)
   Subjective:    Patient ID: Thomas [name], male    DOB: 11/22/1945, 76 y.o.   MRN: [id number]  HPI Pt arrives for follow up. Pt was seen for depression on 02/22/22 and placed on Sertraline 50 mg. Pt states he still has some depressed day but not as bad as they were.  Pt daughter has been on depression meds for a while and she told pt to ask provider about it-Effexor XR.  We did discuss his depression He denies being suicidal States he is trying to be more healthy and his activities Does not eat as much Review of Systems     Objective:   Physical Exam  General-in no acute distress Eyes-no discharge Lungs-respiratory rate normal, CTA CV-no murmurs,RRR Extremities skin warm dry no edema Neuro grossly normal Behavior normal, alert  We did discuss sertraline versus Effexor if he decides to stay with sertraline we will bump up the dose     Assessment & Plan:   1. Depression, major, single episode, in partial remission ([place]) Recommend bumping up the dose of the sertraline 100 mg daily.  Follow-up if any ongoing troubles warning signs discussed recheck again in 2 months time patient not suicidal

## 2022-04-16 ENCOUNTER — Other Ambulatory Visit: Payer: Self-pay

## 2022-04-16 ENCOUNTER — Telehealth: Payer: Self-pay

## 2022-04-16 MED ORDER — LEVOTHYROXINE SODIUM 50 MCG PO TABS: ORAL_TABLET | ORAL | 5 refills | Status: DC

## 2022-04-16 NOTE — Telephone Encounter (Signed)
Encourage patient to contact the pharmacy for refills or they can request refills through [facility]  (Please schedule appointment if patient has not been seen in over a year)    WHAT PHARMACY WOULD THEY LIKE THIS SENT TO: [facility] [place]   MEDICATION NAME & DOSE:levothyroxine (SYNTHROID) 50 MCG tablet , pantoprazole (PROTONIX) 40 MG tablet   NOTES/COMMENTS FROM:    [place] office please notify patient: It takes 48-72 hours to process rx refill requests Ask patient to call pharmacy to ensure rx is ready before heading there.

## 2022-05-20 ENCOUNTER — Telehealth: Payer: Self-pay

## 2022-05-20 MED ORDER — ALBUTEROL SULFATE HFA 108 (90 BASE) MCG/ACT IN AERS: INHALATION_SPRAY | RESPIRATORY_TRACT | 1 refills | Status: DC

## 2022-05-20 NOTE — Telephone Encounter (Signed)
Prescription sent electronically to pharmacy. Left message to return call to notify patient. 

## 2022-05-20 NOTE — Telephone Encounter (Signed)
2 rf

## 2022-05-20 NOTE — Telephone Encounter (Signed)
Encourage patient to contact the pharmacy for refills or they can request refills through [facility]  (Please schedule appointment if patient has not been seen in over a year)    [place] TO: [place], [place]  albuterol (VENTOLIN HFA) 108 (90 Base) MCG/ACT inhaler  MEDICATION NAME & DOSE:  NOTES/COMMENTS FROM PATIENT:      [place] office please notify patient: It takes 48-72 hours to process rx refill requests Ask patient to call pharmacy to ensure rx is ready before heading there.

## 2022-05-21 NOTE — Telephone Encounter (Signed)
Left voice message for patient to call office.

## 2022-05-21 NOTE — Telephone Encounter (Signed)
Spoke with patient and patient advised per Dr Arva Chafe Prescription sent electronically to pharmacy  Patient verbalized understanding.

## 2022-05-22 ENCOUNTER — Other Ambulatory Visit: Payer: Self-pay

## 2022-05-22 MED ORDER — PANTOPRAZOLE SODIUM 40 MG PO TBEC: 40.0000 mg | DELAYED_RELEASE_TABLET | Freq: Every day | ORAL | 0 refills | Status: DC

## 2022-05-23 ENCOUNTER — Ambulatory Visit
Admission: RE | Admit: 2022-05-23 | Discharge: 2022-05-23 | Disposition: A | Discharge status: Home / Self Care | Payer: No Typology Code available for payment source | Source: Ambulatory Visit | Attending: Oncology | Admitting: Oncology

## 2022-05-23 DIAGNOSIS — M8589 Other specified disorders of bone density and structure, multiple sites: Secondary | ICD-10-CM | POA: Insufficient documentation

## 2022-05-23 DIAGNOSIS — C61 Malignant neoplasm of prostate: Secondary | ICD-10-CM | POA: Diagnosis not present

## 2022-05-23 DIAGNOSIS — Z13820 Encounter for screening for osteoporosis: Secondary | ICD-10-CM | POA: Diagnosis not present

## 2022-06-03 ENCOUNTER — Ambulatory Visit: Payer: No Typology Code available for payment source | Admitting: Family Medicine

## 2022-06-03 VITALS — BP 124/70 | Wt 175.6 lb

## 2022-06-03 DIAGNOSIS — F321 Major depressive disorder, single episode, moderate: Secondary | ICD-10-CM | POA: Diagnosis not present

## 2022-06-03 MED ORDER — SERTRALINE HCL 100 MG PO TABS: 100.0000 mg | ORAL_TABLET | Freq: Every day | ORAL | 4 refills | Status: DC

## 2022-06-03 NOTE — Progress Notes (Signed)
   Subjective:    Patient ID: Thomas [name], male    DOB: Aug 25, 1945, 77 y.o.   MRN: [id number]  HPI Patient arrives today for 2 month depression follow up.  Patient here He thinks it might be a smidge better but he still finds himself feeling depressed he also finds himself feeling lonely and anxious.  We talked about trying to be around other people to help him He does feel the medicine is helping to some degree Denies any major setbacks otherwise not suicidal Not suicidal Review of Systems     Objective:   Physical Exam  General-in no acute distress Eyes-no discharge Lungs-respiratory rate normal, CTA CV-no murmurs,RRR Extremities skin warm dry no edema Neuro grossly normal Behavior normal, alert       Assessment & Plan:   Major depression Slightly better Continue current medicines Patient does not want to go up on the dose currently Follow-up within 3 months Follow-up sooner problems Try to focus at about being around other people on a more frequent basis

## 2022-06-04 ENCOUNTER — Telehealth: Payer: Self-pay

## 2022-06-04 ENCOUNTER — Other Ambulatory Visit: Payer: Self-pay

## 2022-06-04 MED ORDER — SERTRALINE HCL 100 MG PO TABS: 150.0000 mg | ORAL_TABLET | Freq: Every day | ORAL | 4 refills | Status: DC

## 2022-06-04 NOTE — Telephone Encounter (Signed)
Discontinue Breo Sertraline adjust to 1-1/2 tablet daily #45 with 4 refills Please keep follow-up visit in May Follow-up sooner if having any problems

## 2022-06-04 NOTE — Telephone Encounter (Signed)
Patient had a visit yesterday. Patient calls and states he decided sertraline will need to increase to 150 mg daily from 100 mg daily. Also he states he does not need Breo Ellipta at this time. Please advise.

## 2022-06-04 NOTE — Telephone Encounter (Signed)
Patient has been made aware of medication changes per drs notes and recommendations

## 2022-07-08 ENCOUNTER — Other Ambulatory Visit: Payer: Self-pay | Admitting: Family Medicine

## 2022-07-12 ENCOUNTER — Encounter: Payer: Self-pay | Admitting: Oncology

## 2022-07-12 ENCOUNTER — Inpatient Hospital Stay: Payer: No Typology Code available for payment source

## 2022-07-12 ENCOUNTER — Inpatient Hospital Stay (HOSPITAL_BASED_OUTPATIENT_CLINIC_OR_DEPARTMENT_OTHER): Payer: No Typology Code available for payment source | Admitting: Oncology

## 2022-07-12 ENCOUNTER — Inpatient Hospital Stay: Payer: No Typology Code available for payment source | Attending: Oncology

## 2022-07-12 VITALS — BP 127/72 | HR 96 | Temp 94.5°F | Ht 62.0 in | Wt 178.5 lb

## 2022-07-12 DIAGNOSIS — Z5181 Encounter for therapeutic drug level monitoring: Secondary | ICD-10-CM | POA: Diagnosis not present

## 2022-07-12 DIAGNOSIS — C61 Malignant neoplasm of prostate: Secondary | ICD-10-CM | POA: Insufficient documentation

## 2022-07-12 DIAGNOSIS — Z79818 Long term (current) use of other agents affecting estrogen receptors and estrogen levels: Secondary | ICD-10-CM | POA: Diagnosis not present

## 2022-07-12 DIAGNOSIS — Z5111 Encounter for antineoplastic chemotherapy: Secondary | ICD-10-CM | POA: Diagnosis not present

## 2022-07-12 DIAGNOSIS — Z87891 Personal history of nicotine dependence: Secondary | ICD-10-CM | POA: Diagnosis not present

## 2022-07-12 LAB — CBC WITH DIFFERENTIAL/PLATELET
Abs Immature Granulocytes: 0.03 10*3/uL (ref 0.00–0.07)
Basophils Absolute: 0.1 10*3/uL (ref 0.0–0.1)
Basophils Relative: 1 %
Eosinophils Absolute: 0.3 10*3/uL (ref 0.0–0.5)
Eosinophils Relative: 5 %
HCT: 40.2 % (ref 39.0–52.0)
Hemoglobin: 13.6 g/dL (ref 13.0–17.0)
Immature Granulocytes: 1 %
Lymphocytes Relative: 18 %
Lymphs Abs: 1.2 10*3/uL (ref 0.7–4.0)
MCH: 32.3 pg (ref 26.0–34.0)
MCHC: 33.8 g/dL (ref 30.0–36.0)
MCV: 95.5 fL (ref 80.0–100.0)
Monocytes Absolute: 0.6 10*3/uL (ref 0.1–1.0)
Monocytes Relative: 9 %
Neutro Abs: 4.2 10*3/uL (ref 1.7–7.7)
Neutrophils Relative %: 66 %
Platelets: 190 10*3/uL (ref 150–400)
RBC: 4.21 MIL/uL — ABNORMAL LOW (ref 4.22–5.81)
RDW: 13.2 % (ref 11.5–15.5)
WBC: 6.4 10*3/uL (ref 4.0–10.5)
nRBC: 0 % (ref 0.0–0.2)

## 2022-07-12 LAB — PSA: Prostatic Specific Antigen: 0.23 ng/mL (ref 0.00–4.00)

## 2022-07-12 LAB — COMPREHENSIVE METABOLIC PANEL
ALT: 16 U/L (ref 0–44)
AST: 20 U/L (ref 15–41)
Albumin: 4.2 g/dL (ref 3.5–5.0)
Alkaline Phosphatase: 71 U/L (ref 38–126)
Anion gap: 7 (ref 5–15)
BUN: 16 mg/dL (ref 8–23)
CO2: 25 mmol/L (ref 22–32)
Calcium: 8.8 mg/dL — ABNORMAL LOW (ref 8.9–10.3)
Chloride: 104 mmol/L (ref 98–111)
Creatinine, Ser: 1.09 mg/dL (ref 0.61–1.24)
GFR, Estimated: >60 mL/min (ref >60)
Glucose, Bld: 119 mg/dL — ABNORMAL HIGH (ref 70–99)
Potassium: 4.5 mmol/L (ref 3.5–5.1)
Sodium: 136 mmol/L (ref 135–145)
Total Bilirubin: 0.6 mg/dL (ref 0.3–1.2)
Total Protein: 6.9 g/dL (ref 6.5–8.1)

## 2022-07-12 MED ORDER — LEUPROLIDE ACETATE (6 MONTH) 45 MG ~~LOC~~ KIT
45.0000 mg | PACK | SUBCUTANEOUS | Status: AC
  Administered 2022-07-12: 45 mg via SUBCUTANEOUS
  Filled 2022-07-12: qty 45

## 2022-07-12 NOTE — Progress Notes (Signed)
Patient has some concerns about potassium.

## 2022-07-12 NOTE — Progress Notes (Signed)
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  I do not have the biopsy report from 7 years ago.  He received I-125 interstitial implant for the same.  His presenting PSA at that time was 8.7.  He was last given a dose of Eligard in March 2021 following which his PSA dropped down from 5.28-0.27.  He did not receive any Eligard since then and PSA went back up to 4.7   He has been referred to medical oncology for consideration of restarting ADT.   Lupron restarted in March 2022  Interval history-patient states that his depression symptoms are better after starting medications.  He is tolerating Lupron well without any significant side effects.  ECOG PS- 2 Pain scale- 0   Review of systems- Review of Systems  Constitutional:  Positive for malaise/fatigue. Negative for chills, fever and weight loss.  HENT:  Negative for congestion, ear discharge and nosebleeds.   Eyes:  Negative for blurred vision.  Respiratory:  Negative for cough, hemoptysis, sputum production, shortness of breath and wheezing.   Cardiovascular:  Negative for chest pain, palpitations, orthopnea and claudication.  Gastrointestinal:  Negative for abdominal pain, blood in stool, constipation, diarrhea, heartburn, melena, nausea and vomiting.  Genitourinary:  Negative for dysuria, flank pain, frequency, hematuria and urgency.  Musculoskeletal:  Negative for back pain, joint pain and myalgias.  Skin:  Negative  for rash.  Neurological:  Negative for dizziness, tingling, focal weakness, seizures, weakness and headaches.  Endo/Heme/Allergies:  Does not bruise/bleed easily.  Psychiatric/Behavioral:  Negative for depression and suicidal ideas. The patient does not have insomnia.       Allergies  Allergen Reactions   Augmentin [Amoxicillin-Pot Clavulanate]     Gi upset   Ciprofloxacin    Nsaids Other (See Comments)    Stomach ulcer    Rosuvastatin     Myalgias     Past Medical History:  Diagnosis Date   Allergy    trees and plants   Anemia    IDA   Anxiety    Asthma 1998   Diverticulitis    Elevated PSA    GERD (gastroesophageal reflux disease)    Hyperlipidemia    Hypothyroidism 03/16/2019   Prostate cancer [facility])      Past Surgical History:  Procedure Laterality Date   HERNIA REPAIR  1999   prostate cancer     TRANSURETHRAL RESECTION OF PROSTATE     VASECTOMY      Social History   Socioeconomic History   Marital status: Married    Spouse name: Not on file   Number of children: Not on file   Years of education: Not on file   Highest education level: Not on file  Occupational History   Not on file  Tobacco Use   Smoking status: Former    Types: Cigarettes    Quit date: 04/22/1982    Years since quitting: 40.2   Smokeless tobacco: Never  Vaping Use   Vaping Use: Never used  Substance and Sexual Activity  Alcohol use: Yes    Alcohol/week: 2.0 standard drinks of alcohol    Types: 2 Shots of liquor per week    Comment: bourbon every night   Drug use: Not Currently   Sexual activity: Not Currently  Other Topics Concern   Not on file  Social History Narrative   Not on file   Social Determinants of Health   Financial Resource Strain: Low Risk  (12/19/2021)   Overall Financial Resource Strain (CARDIA)    Difficulty of Paying Living Expenses: Not hard at all  Food Insecurity: No Food Insecurity (12/19/2021)   Hunger Vital Sign    Worried About Running Out of  Food in the Last Year: Never true    Ran Out of Food in the Last Year: Never true  Transportation Needs: No Transportation Needs (12/19/2021)   PRAPARE - Hydrologist (Medical): No    Lack of Transportation (Non-Medical): No  Physical Activity: Inactive (12/19/2021)   Exercise Vital Sign    Days of Exercise per Week: 0 days    Minutes of Exercise per Session: 0 min  Stress: No Stress Concern Present (12/19/2021)   [place]    Feeling of Stress : Not at all  Social Connections: Moderately Isolated (12/19/2021)   Social Connection and Isolation Panel [NHANES]    Frequency of Communication with Friends and Family: Twice a week    Frequency of Social Gatherings with Friends and Family: Once a week    Attends Religious Services: More than 4 times per year    Active Member of [employer] or Organizations: No    Attends Archivist Meetings: Never    Marital Status: Divorced  Human resources officer Violence: Not At Risk (12/19/2021)   Humiliation, Afraid, Rape, and Kick questionnaire    Fear of Current or Ex-Partner: No    Emotionally Abused: No    Physically Abused: No    Sexually Abused: No    Family History  Problem Relation Age of Onset   Heart disease Father    Hypertension Brother    Diabetes Brother      Current Outpatient Medications:    acetaminophen (TYLENOL) 500 MG tablet, Take 500 mg by mouth every 6 (six) hours as needed., Disp: , Rfl:    albuterol (VENTOLIN HFA) 108 (90 Base) MCG/ACT inhaler, INHALE 2 PUFFS BY MOUTH EVERY 6 HOURS AS NEEDED FOR WHEEZING, Disp: 9 g, Rfl: 0   Ascorbic Acid (VITAMIN C) 100 MG tablet, Take 100 mg by mouth daily., Disp: , Rfl:    co-enzyme Q-10 30 MG capsule, Take 30 mg by mouth daily., Disp: , Rfl:    ferrous sulfate 325 (65 FE) MG tablet, Take 325 mg by mouth daily with breakfast., Disp: , Rfl:    fexofenadine (ALLEGRA) 180 MG tablet, Take 180 mg by  mouth daily., Disp: , Rfl:    FIBER PO, Take 1 tablet by mouth daily., Disp: , Rfl:    fluticasone (FLONASE) 50 MCG/ACT nasal spray, Place 2 sprays into both nostrils daily., Disp: 16 g, Rfl: 4   levothyroxine (SYNTHROID) 50 MCG tablet, Take 1 1/2 tablets on M W Fri Sat Sun and take 1 on Tues and Thursday, Disp: 120 tablet, Rfl: 5   Melatonin 1 MG TABS, Take 5 mg by mouth at bedtime as needed., Disp: , Rfl:    Multiple Vitamins-Minerals (MULTIVITAMIN WITH MINERALS) tablet, Take 1 tablet by mouth daily., Disp: , Rfl:  nitroGLYCERIN (NITROSTAT) 0.4 MG SL tablet, Place 1 tablet (0.4 mg total) under the tongue every 5 (five) minutes as needed for chest pain., Disp: 30 tablet, Rfl: 5   Omega-3 Fatty Acids (FISH OIL) 1000 MG CAPS, Take 1 capsule by mouth daily., Disp: , Rfl:    pantoprazole (PROTONIX) 40 MG tablet, Take 1 tablet (40 mg total) by mouth daily., Disp: 30 tablet, Rfl: 0   Red Yeast Rice Extract 300 MG CAPS, Take 1 tablet by mouth daily., Disp: , Rfl:    sertraline (ZOLOFT) 100 MG tablet, Take 1.5 tablets (150 mg total) by mouth daily., Disp: 45 tablet, Rfl: 4 No current facility-administered medications for this visit.  Facility-Administered Medications Ordered in Other Visits:    leuprolide (6 Month) (ELIGARD) injection 45 mg, 45 mg, Subcutaneous, Q6 months, Sindy Guadeloupe, MD, 45 mg at 01/11/22 1206   leuprolide (6 Month) (ELIGARD) injection 45 mg, 45 mg, Subcutaneous, Q6 months, Sindy Guadeloupe, MD, 45 mg at 07/12/22 1051  Physical exam:  Vitals:   07/12/22 0955  BP: 127/72  Pulse: 96  Temp: (!) 94.5 F (34.7 C)  TempSrc: Tympanic  SpO2: 100%  Weight: 178 lb 8 oz (81 kg)  Height: 5\' 2"  (1.575 m)   Physical Exam Cardiovascular:     Rate and Rhythm: Normal rate and regular rhythm.     Heart sounds: Normal heart sounds.  Pulmonary:     Effort: Pulmonary effort is normal.     Breath sounds: Normal breath sounds.  Skin:    General: Skin is warm and dry.  Neurological:      Mental Status: He is alert and oriented to person, place, and time.        Latest Ref Rng & Units 07/12/2022    9:44 AM  CMP  Glucose 70 - 99 mg/dL 119   BUN 8 - 23 mg/dL 16   Creatinine 0.61 - 1.24 mg/dL 1.09   Sodium 135 - 145 mmol/L 136   Potassium 3.5 - 5.1 mmol/L 4.5   Chloride 98 - 111 mmol/L 104   CO2 22 - 32 mmol/L 25   Calcium 8.9 - 10.3 mg/dL 8.8   Total Protein 6.5 - 8.1 g/dL 6.9   Total Bilirubin 0.3 - 1.2 mg/dL 0.6   Alkaline Phos 38 - 126 U/L 71   AST 15 - 41 U/L 20   ALT 0 - 44 U/L 16       Latest Ref Rng & Units 07/12/2022    9:44 AM  CBC  WBC 4.0 - 10.5 K/uL 6.4   Hemoglobin 13.0 - 17.0 g/dL 13.6   Hematocrit 39.0 - 52.0 % 40.2   Platelets 150 - 400 K/uL 190     Assessment and plan- Patient is a 77 y.o. male  with castrate sensitive nonmetastatic prostate cancer here for routine follow-up and to receive Lupron  Patient has nonmetastatic castrate sensitive prostate cancer.  His PSA has been consistently trending down from 4.72 years ago presently 2.19 in September 2023.  Levels from today are pending.  Will proceed with his Lupron today.  Since it has been over 2 years since his ADT was initiated, I am inclined to observe his PSA conservatively without continuing ADT beyond today.  It would be okay to do intermittent ADT and nonmetastatic castrate sensitive prostate cancer.  If his PSA starts going up again after stopping ADT we will need to restart it.  His bone density scan also shows osteopenia which can potentially  worsen with continued ADT.  I will see him back in 6 months with CBC with differential CMP and PSA   Visit Diagnosis 1. Malignant neoplasm of prostate ([place])   2. Encounter for monitoring Lupron therapy      Dr. Randa Evens, MD, MPH [facility] [id number] 07/12/2022 1:17 PM

## 2022-07-14 ENCOUNTER — Other Ambulatory Visit: Payer: Self-pay | Admitting: Family Medicine

## 2022-07-31 ENCOUNTER — Other Ambulatory Visit: Payer: Self-pay | Admitting: Family Medicine

## 2022-09-02 ENCOUNTER — Ambulatory Visit: Payer: No Typology Code available for payment source | Admitting: Family Medicine

## 2022-09-04 ENCOUNTER — Ambulatory Visit: Payer: No Typology Code available for payment source | Admitting: Family Medicine

## 2022-09-04 VITALS — BP 124/68 | Ht 62.0 in | Wt 180.0 lb

## 2022-09-04 DIAGNOSIS — R0609 Other forms of dyspnea: Secondary | ICD-10-CM | POA: Diagnosis not present

## 2022-09-04 MED ORDER — SERTRALINE HCL 100 MG PO TABS: 150.0000 mg | ORAL_TABLET | Freq: Every day | ORAL | 4 refills | Status: DC

## 2022-09-04 MED ORDER — LEVOTHYROXINE SODIUM 50 MCG PO TABS: ORAL_TABLET | ORAL | 5 refills | Status: DC

## 2022-09-04 NOTE — Progress Notes (Signed)
   Subjective:    Patient ID: Thomas [name], male    DOB: 28-May-1945, 77 y.o.   [name]  HPI  Patient arrives for a follow up on depression.  Patient relates that the medicine is helping with his depression but he finds himself feeling lonely a lot we did talk about going [facility] the [facility] In addition to this he denies being suicidal Patient states the medication helps but he is still depressed everyday and also having a flare of COPD He states he has intermittent shortness of breath when he does stuff and he is interested in using the albuterol I discussed with him how we need to be defined the diagnosis he denies any chest pressure tightness pain PND or swelling in the legs Review of Systems     Objective:   Physical Exam  General-in no acute distress Eyes-no discharge Lungs-respiratory rate normal, CTA CV-no murmurs,RRR Extremities skin warm dry no edema Neuro grossly normal Behavior normal, alert       Assessment & Plan:  DOE-pulmonary function test and chest x-ray Continue albuterol as needed Major depression continue sertraline Patient does not do counseling Encourage increase socialization Not suicidal

## 2022-10-07 ENCOUNTER — Ambulatory Visit (INDEPENDENT_AMBULATORY_CARE_PROVIDER_SITE_OTHER): Payer: No Typology Code available for payment source | Admitting: Family Medicine

## 2022-10-07 VITALS — BP 147/86 | HR 89 | Wt 177.0 lb

## 2022-10-07 DIAGNOSIS — L239 Allergic contact dermatitis, unspecified cause: Secondary | ICD-10-CM

## 2022-10-07 MED ORDER — PREDNISONE 20 MG PO TABS: ORAL_TABLET | ORAL | 0 refills | Status: DC

## 2022-10-07 MED ORDER — DEXAMETHASONE SODIUM PHOSPHATE 4 MG/ML IJ SOLN
2.0000 mg | Freq: Once | INTRAMUSCULAR | Status: AC
Start: 2022-10-07 — End: 2022-10-07
  Administered 2022-10-07: 2 mg via INTRAMUSCULAR

## 2022-10-07 MED ORDER — HYDROCORTISONE 2.5 % EX CREA: TOPICAL_CREAM | CUTANEOUS | 1 refills | Status: DC

## 2022-10-07 NOTE — Progress Notes (Signed)
   Subjective:    Patient ID: Thomas [name], male    DOB: 02/24/1946, 77 y.o.   [name]  HPI Patient arrives today having rash on chest arm pits.  Patient with rash underneath the armpits on the chest around his neck around his waist he relates that he was outside and then he started itching he thought it might be related to ticks he started spraying himself with DEET then he started having a rash it itches a lot Patient states he fell yesterday. Patient states he is itching really bad.   Review of Systems     Objective:   Physical Exam No sign of tick related disease Lungs clear heart regular Erythematous rash under the arms chest abdomen with some elements of contact dermatitis       Assessment & Plan:  Contact dermatitis Short prednisone taper Healthy diet Follow-up if progressive troubles Dexamethasone to help with inflammation and itching Follow-up if problems

## 2022-10-07 NOTE — Addendum Note (Signed)
Addended by: Lilyan Punt A on: 10/07/2022 08:54 PM   Modules accepted: Orders

## 2022-12-02 ENCOUNTER — Ambulatory Visit (INDEPENDENT_AMBULATORY_CARE_PROVIDER_SITE_OTHER): Payer: No Typology Code available for payment source | Admitting: Family Medicine

## 2022-12-02 ENCOUNTER — Encounter: Payer: Self-pay | Admitting: Family Medicine

## 2022-12-02 VITALS — BP 136/80 | HR 98 | Temp 97.0°F | Ht 62.0 in | Wt 172.0 lb

## 2022-12-02 DIAGNOSIS — F321 Major depressive disorder, single episode, moderate: Secondary | ICD-10-CM

## 2022-12-02 MED ORDER — SERTRALINE HCL 100 MG PO TABS: 200.0000 mg | ORAL_TABLET | Freq: Every day | ORAL | 5 refills | Status: DC

## 2022-12-02 NOTE — Progress Notes (Signed)
   Subjective:    Patient ID: Thomas [name], male    DOB: 1946-02-14, 77 y.o.   MRN: [id number]  HPI Patient relates he had a few days where he felt very depressed he increased his Zoloft to 200 mg felt better but then he was scared whether or not to continue it at 200 mg he states he is feeling better now still having some depression he relates that on some days he stays in bed until almost noon other days he does get up at 7 or 8 in the morning he states he sleeps okay eats okay has some contact with friends does go to church on a weekly basis patient still depressed regarding the low loss of his marriage   Review of Systems     Objective:   Physical Exam  General-in no acute distress Eyes-no discharge Lungs-respiratory rate normal, CTA CV-no murmurs,RRR Extremities skin warm dry no edema Neuro grossly normal Behavior normal, alert       Assessment & Plan:   Major depression Patient states he did better with increasing the dose of the sertraline was on 150 mg currently on 200 mg I would recommend maintaining that He is hesitant to maintain it We did discuss continuing the dose of 200 mg daily for at least 6 weeks He has a follow-up visit in early October I also encouraged him to develop a daily habit of sitting outside 30 minutes daily on nice days Also discussed the importance of healthy eating he is on a limited budget He will follow-up sooner if any problems  Patient not suicidal

## 2022-12-02 NOTE — Progress Notes (Signed)
   Subjective:    Patient ID: Thomas [name], male    DOB: [birth year], 77 y.o.   MRN: [id number]  HPI Patient is here for depression follow up  States had an episode last week where he was really depressed  Not suicidal or homocidal has some questions about his medication   Review of Systems     Objective:   Physical Exam        Assessment & Plan:

## 2023-01-10 ENCOUNTER — Ambulatory Visit (INDEPENDENT_AMBULATORY_CARE_PROVIDER_SITE_OTHER): Payer: No Typology Code available for payment source

## 2023-01-10 VITALS — Ht 68.0 in | Wt 160.0 lb

## 2023-01-10 DIAGNOSIS — Z0000 Encounter for general adult medical examination without abnormal findings: Secondary | ICD-10-CM | POA: Diagnosis not present

## 2023-01-10 NOTE — Patient Instructions (Signed)
Thomas [name] , Thank you for taking time to come for your Medicare Wellness Visit. I appreciate your ongoing commitment to your health goals. Please review the following plan we discussed and let me know if I can assist you in the future.   Referrals/Orders/Follow-Ups/Clinician Recommendations:  Next Medicare Annual Wellness Visit: January 16, 2024 at 11:20am This will be a virtual visit so we will do it via video. If you are unable to do the visit through video please let our office know.    This is a list of the screening recommended for you and due dates:  Health Maintenance  Topic Date Due   Hepatitis C Screening  Never done   Zoster (Shingles) Vaccine (1 of 2) Never done   COVID-19 Vaccine (4 - 2023-24 season) 12/22/2022   Flu Shot  07/21/2023*   Medicare Annual Wellness Visit  01/10/2024   DTaP/Tdap/Td vaccine (2 - Td or Tdap) 09/22/2031   Pneumonia Vaccine  Completed   HPV Vaccine  Aged Out   Colon Cancer Screening  Discontinued  *Topic was postponed. The date shown is not the original due date.    Advanced directives: (In Chart) A copy of your advanced directives are scanned into your chart should your provider ever need it.  Next Medicare Annual Wellness Visit scheduled for next year: Yes  Preventive Care 32 Years and Older, Male Preventive care refers to lifestyle choices and visits with your health care provider that can promote health and wellness. Preventive care visits are also called wellness exams. What can I expect for my preventive care visit? Counseling During your preventive care visit, your health care provider may ask about your: Medical history, including: Past medical problems. Family medical history. History of falls. Current health, including: Emotional well-being. Home life and relationship well-being. Sexual activity. Memory and ability to understand (cognition). Lifestyle, including: Alcohol, nicotine or tobacco, and drug use. Access to  firearms. Diet, exercise, and sleep habits. Work and work Astronomer. Sunscreen use. Safety issues such as seatbelt and bike helmet use. Physical exam Your health care provider will check your: Height and weight. These may be used to calculate your BMI (body mass index). BMI is a measurement that tells if you are at a healthy weight. Waist circumference. This measures the distance around your waistline. This measurement also tells if you are at a healthy weight and may help predict your risk of certain diseases, such as type 2 diabetes and high blood pressure. Heart rate and blood pressure. Body temperature. Skin for abnormal spots. What immunizations do I need?  Vaccines are usually given at various ages, according to a schedule. Your health care provider will recommend vaccines for you based on your age, medical history, and lifestyle or other factors, such as travel or where you work. What tests do I need? Screening Your health care provider may recommend screening tests for certain conditions. This may include: Lipid and cholesterol levels. Diabetes screening. This is done by checking your blood sugar (glucose) after you have not eaten for a while (fasting). Hepatitis C test. Hepatitis B test. HIV (human immunodeficiency virus) test. STI (sexually transmitted infection) testing, if you are at risk. Lung cancer screening. Colorectal cancer screening. Prostate cancer screening. Abdominal aortic aneurysm (AAA) screening. You may need this if you are a current or former smoker. Talk with your health care provider about your test results, treatment options, and if necessary, the need for more tests. Follow these instructions at home: Eating and drinking  Eat a  diet that includes fresh fruits and vegetables, whole grains, lean protein, and low-fat dairy products. Limit your intake of foods with high amounts of sugar, saturated fats, and salt. Take vitamin and mineral supplements as  recommended by your health care provider. Do not drink alcohol if your health care provider tells you not to drink. If you drink alcohol: Limit how much you have to 0-2 drinks a day. Know how much alcohol is in your drink. In the [place] one drink equals one 12 oz bottle of beer (355 mL), one 5 oz glass of wine (148 mL), or one 1 oz glass of hard liquor (44 mL). Lifestyle Brush your teeth every morning and night with fluoride toothpaste. Floss one time each day. Exercise for at least 30 minutes 5 or more days each week. Do not use any products that contain nicotine or tobacco. These products include cigarettes, chewing tobacco, and vaping devices, such as e-cigarettes. If you need help quitting, ask your health care provider. Do not use drugs. If you are sexually active, practice safe sex. Use a condom or other form of protection to prevent STIs. Take aspirin only as told by your health care provider. Make sure that you understand how much to take and what form to take. Work with your health care provider to find out whether it is safe and beneficial for you to take aspirin daily. Ask your health care provider if you need to take a cholesterol-lowering medicine (statin). Find healthy ways to manage stress, such as: Meditation, yoga, or listening to music. Journaling. Talking to a trusted person. Spending time with friends and family. Safety Always wear your seat belt while driving or riding in a vehicle. Do not drive: If you have been drinking alcohol. Do not ride with someone who has been drinking. When you are tired or distracted. While texting. If you have been using any mind-altering substances or drugs. Wear a helmet and other protective equipment during sports activities. If you have firearms in your house, make sure you follow all gun safety procedures. Minimize exposure to UV radiation to reduce your risk of skin cancer. What's next? Visit your health care provider once a year for  an annual wellness visit. Ask your health care provider how often you should have your eyes and teeth checked. Stay up to date on all vaccines. This information is not intended to replace advice given to you by your health care provider. Make sure you discuss any questions you have with your health care provider. Document Revised: 10/04/2020 Document Reviewed: 10/04/2020 Elsevier Patient Education  2024 [employer]. Understanding Your Risk for Falls Millions of people have serious injuries from falls each year. It is important to understand your risk of falling. Talk with your health care provider about your risk and what you can do to lower it. If you do have a serious fall, make sure to tell your provider. Falling once raises your risk of falling again. How can falls affect me? Serious injuries from falls are common. These include: Broken bones, such as hip fractures. Head injuries, such as traumatic brain injuries (TBI) or concussions. A fear of falling can cause you to avoid activities and stay at home. This can make your muscles weaker and raise your risk for a fall. What can increase my risk? There are a number of risk factors that increase your risk for falling. The more risk factors you have, the higher your risk of falling. Serious injuries from a fall happen most often  to people who are older than 79 years old. Teenagers and young adults ages 69-29 are also at higher risk. Common risk factors include: Weakness in the lower body. Being generally weak or confused due to long-term (chronic) illness. Dizziness or balance problems. Poor vision. Medicines that cause dizziness or drowsiness. These may include: Medicines for your blood pressure, heart, anxiety, insomnia, or swelling (edema). Pain medicines. Muscle relaxants. Other risk factors include: Drinking alcohol. Having had a fall in the past. Having foot pain or wearing improper footwear. Working at a dangerous job. Having  any of the following in your home: Tripping hazards, such as floor clutter or loose rugs. Poor lighting. Pets. Having dementia or memory loss. What actions can I take to lower my risk of falling?     Physical activity Stay physically fit. Do strength and balance exercises. Consider taking a regular class to build strength and balance. Yoga and tai chi are good options. Vision Have your eyes checked every year and your prescription for glasses or contacts updated as needed. Shoes and walking aids Wear non-skid shoes. Wear shoes that have rubber soles and low heels. Do not wear high heels. Do not walk around the house in socks or slippers. Use a cane or walker as told by your provider. Home safety Attach secure railings on both sides of your stairs. Install grab bars for your bathtub, shower, and toilet. Use a non-skid mat in your bathtub or shower. Attach bath mats securely with double-sided, non-slip rug tape. Use good lighting in all rooms. Keep a flashlight near your bed. Make sure there is a clear path from your bed to the bathroom. Use night-lights. Do not use throw rugs. Make sure all carpeting is taped or tacked down securely. Remove all clutter from walkways and stairways, including extension cords. Repair uneven or broken steps and floors. Avoid walking on icy or slippery surfaces. Walk on the grass instead of on icy or slick sidewalks. Use ice melter to get rid of ice on walkways in the winter. Use a cordless phone. Questions to ask your health care provider Can you help me check my risk for a fall? Do any of my medicines make me more likely to fall? Should I take a vitamin D supplement? What exercises can I do to improve my strength and balance? Should I make an appointment to have my vision checked? Do I need a bone density test to check for weak bones (osteoporosis)? Would it help to use a cane or a walker? Where to find more information Centers for Disease Control  and Prevention, [employer]: TonerPromos.no Community-Based Fall Prevention Programs: TonerPromos.no [employer] on Aging: BaseRingTones.pl Contact a health care provider if: You fall at home. You are afraid of falling at home. You feel weak, drowsy, or dizzy. This information is not intended to replace advice given to you by your health care provider. Make sure you discuss any questions you have with your health care provider. Document Revised: 12/10/2021 Document Reviewed: 12/10/2021 Elsevier Patient Education  2024 [employer].

## 2023-01-10 NOTE — Progress Notes (Signed)
Because this visit was a virtual/telehealth visit,  certain criteria was not obtained, such a blood pressure, CBG if applicable, and timed get up and go. Any medications not marked as "taking" were not mentioned during the medication reconciliation part of the visit. Any vitals not documented were not able to be obtained due to this being a telehealth visit or patient was unable to self-report a recent blood pressure reading due to a lack of equipment at home via telehealth. Vitals that have been documented are verbally provided by the patient.   Subjective:   Thomas [name] is a 77 y.o. male who presents for Medicare Annual/Subsequent preventive examination.  Visit Complete: Virtual  I connected with  Thomas [name] on 01/10/23 by a audio enabled telemedicine application and verified that I am speaking with the correct person using two identifiers.  Patient Location: Home  Provider Location: Home Office  I discussed the limitations of evaluation and management by telemedicine. The patient expressed understanding and agreed to proceed.  Patient Medicare AWV questionnaire was completed by the patient on n/a; I have confirmed that all information answered by patient is correct and no changes since this date.  Cardiac Risk Factors include: advanced age (>59men, >61 women);dyslipidemia;hypertension;male gender;sedentary lifestyle     Objective:    Today's Vitals   01/10/23 1055  Weight: 160 lb (72.6 kg)  Height: 5\' 8"  (1.727 m)   Body mass index is 24.33 kg/m.     01/10/2023   11:04 AM 01/11/2022   11:29 AM 12/19/2021    9:06 AM 09/21/2021    8:33 PM 09/19/2021    1:06 PM 07/09/2021   10:11 AM 01/08/2021    1:31 PM  Advanced Directives  Does Patient Have a Medical Advance Directive? Yes No Yes Yes Yes No Yes  Type of Estate agent of [place];Living will  Healthcare Power of [place];Living will Healthcare Power of [place];Living will Healthcare Power of  [place];Living will  Healthcare Power of [place];Living will  Does patient want to make changes to medical advance directive? No - Patient declined  Yes (Inpatient - patient defers changing a medical advance directive and declines information at this time) No - Patient declined No - Patient declined  No - Patient declined  Copy of Healthcare Power of Attorney in Chart? Yes - validated most recent copy scanned in chart (See row information)  Yes - validated most recent copy scanned in chart (See row information)  No - copy requested    Would patient like information on creating a medical advance directive?  No - Patient declined    Yes (MAU/Ambulatory/Procedural Areas - Information given)     Current Medications (verified) Outpatient Encounter Medications as of 01/10/2023  Medication Sig   acetaminophen (TYLENOL) 500 MG tablet Take 500 mg by mouth every 6 (six) hours as needed.   albuterol (VENTOLIN HFA) 108 (90 Base) MCG/ACT inhaler INHALE 2 PUFFS BY MOUTH EVERY 6 HOURS AS NEEDED FOR WHEEZING   Ascorbic Acid (VITAMIN C) 100 MG tablet Take 100 mg by mouth daily.   co-enzyme Q-10 30 MG capsule Take 30 mg by mouth daily.   ferrous sulfate 325 (65 FE) MG tablet Take 325 mg by mouth daily with breakfast.   fexofenadine (ALLEGRA) 180 MG tablet Take 180 mg by mouth daily.   FIBER PO Take 1 tablet by mouth daily.   fluticasone (FLONASE) 50 MCG/ACT nasal spray Place 2 sprays into both nostrils daily.   hydrocortisone 2.5 % cream Apply bid  prn itching   levothyroxine (SYNTHROID) 50 MCG tablet Take 1 1/2 tablets on M W Fri Sat Sun and take 1 on Tues and Thursday   Melatonin 1 MG TABS Take 5 mg by mouth at bedtime as needed.   Multiple Vitamins-Minerals (MULTIVITAMIN WITH MINERALS) tablet Take 1 tablet by mouth daily.   nitroGLYCERIN (NITROSTAT) 0.4 MG SL tablet Place 1 tablet (0.4 mg total) under the tongue every 5 (five) minutes as needed for chest pain.   Omega-3 Fatty Acids (FISH OIL) 1000 MG CAPS  Take 1 capsule by mouth daily.   predniSONE (DELTASONE) 20 MG tablet 2 qd   Red Yeast Rice Extract 300 MG CAPS Take 1 tablet by mouth daily.   sertraline (ZOLOFT) 100 MG tablet Take 2 tablets (200 mg total) by mouth daily.   Facility-Administered Encounter Medications as of 01/10/2023  Medication   leuprolide (6 Month) (ELIGARD) injection 45 mg   leuprolide (6 Month) (ELIGARD) injection 45 mg    Allergies (verified) Augmentin [amoxicillin-pot clavulanate], Ciprofloxacin, Nsaids, and Rosuvastatin   History: Past Medical History:  Diagnosis Date   Allergy    trees and plants   Anemia    IDA   Anxiety    Asthma 1998   Diverticulitis    Elevated PSA    GERD (gastroesophageal reflux disease)    Hyperlipidemia    Hypothyroidism 03/16/2019   Prostate cancer [facility])    Past Surgical History:  Procedure Laterality Date   HERNIA REPAIR  1999   prostate cancer     TRANSURETHRAL RESECTION OF PROSTATE     VASECTOMY     Family History  Problem Relation Age of Onset   Heart disease Father    Hypertension Brother    Diabetes Brother    Social History   Socioeconomic History   Marital status: Married    Spouse name: Not on file   Number of children: Not on file   Years of education: Not on file   Highest education level: Not on file  Occupational History   Not on file  Tobacco Use   Smoking status: Former    Current packs/day: 0.00    Types: Cigarettes    Quit date: 04/22/1982    Years since quitting: 40.7   Smokeless tobacco: Never  Vaping Use   Vaping status: Never Used  Substance and Sexual Activity   Alcohol use: Yes    Alcohol/week: 2.0 standard drinks of alcohol    Types: 2 Shots of liquor per week    Comment: bourbon every night   Drug use: Not Currently   Sexual activity: Not Currently  Other Topics Concern   Not on file  Social History Narrative   Not on file   Social Determinants of Health   Financial Resource Strain: Low Risk  (01/10/2023)   Overall  Financial Resource Strain (CARDIA)    Difficulty of Paying Living Expenses: Not hard at all  Food Insecurity: No Food Insecurity (01/10/2023)   Hunger Vital Sign    Worried About Running Out of Food in the Last Year: Never true    Ran Out of Food in the Last Year: Never true  Transportation Needs: No Transportation Needs (01/10/2023)   PRAPARE - Administrator, Civil Service (Medical): No    Lack of Transportation (Non-Medical): No  Physical Activity: Inactive (01/10/2023)   Exercise Vital Sign    Days of Exercise per Week: 0 days    Minutes of Exercise per Session: 0 min  Stress: No Stress Concern Present (01/10/2023)   [employer] of Occupational Health - Occupational Stress Questionnaire    Feeling of Stress : Not at all  Social Connections: Moderately Integrated (01/10/2023)   Social Connection and Isolation Panel [NHANES]    Frequency of Communication with Friends and Family: More than three times a week    Frequency of Social Gatherings with Friends and Family: More than three times a week    Attends Religious Services: More than 4 times per year    Active Member of [employer] or Organizations: No    Attends Engineer, structural: Never    Marital Status: Married    Tobacco Counseling Counseling given: Yes   Clinical Intake:  Pre-visit preparation completed: Yes  Pain : No/denies pain     BMI - recorded: 24.33 Nutritional Status: BMI of 19-24  Normal Nutritional Risks: None Diabetes: No  How often do you need to have someone help you when you read instructions, pamphlets, or other written materials from your doctor or pharmacy?: 1 - Never  Interpreter Needed?: No  Information entered by :: Abby Chance Karam, CMA   Activities of Daily Living    01/10/2023   10:58 AM  In your present state of health, do you have any difficulty performing the following activities:  Hearing? 0  Vision? 0  Difficulty concentrating or making decisions? 1  Walking or  climbing stairs? 1  Comment uses cane  Dressing or bathing? 0  Doing errands, shopping? 0  Preparing Food and eating ? N  Using the Toilet? N  In the past six months, have you accidently leaked urine? N  Do you have problems with loss of bowel control? N  Managing your Medications? N  Managing your Finances? N  Housekeeping or managing your Housekeeping? N    Patient Care Team: Babs Sciara, MD as PCP - General (Family Medicine)  Indicate any recent Medical Services you may have received from other than [facility] providers in the past year (date may be approximate).     Assessment:   This is a routine wellness examination for [facility].  Hearing/Vision screen Hearing Screening - Comments:: Patient denies any hearing difficulties.   Vision Screening - Comments:: Patient has not been to an eye doctor in several years and declines referral right now.    Goals Addressed             This Visit's Progress    Patient Stated       Remain active and focus on improving my mental health.        Depression Screen    01/10/2023   11:11 AM 12/02/2022    4:13 PM 10/07/2022    3:40 PM 09/04/2022   11:40 AM 04/01/2022   11:25 AM 02/22/2022   12:20 PM 12/19/2021    9:03 AM  PHQ 2/9 Scores  PHQ - 2 Score 5 5 1 6 6 5 1   PHQ- 9 Score 14 12 8 20 16 13 3     Fall Risk    01/10/2023   11:05 AM 12/02/2022    4:13 PM 10/07/2022    3:39 PM 09/04/2022   11:43 AM 04/01/2022   11:24 AM  Fall Risk   Falls in the past year? 0 1 1 1 1   Number falls in past yr: 0 1 1 1 1   Injury with Fall? 0 0 0 0 1  Risk for fall due to : No Fall Risks History of fall(s)  History of  fall(s) Impaired balance/gait  Follow up Falls prevention discussed Falls evaluation completed  Falls evaluation completed Falls evaluation completed;Education provided    MEDICARE RISK AT HOME: Medicare Risk at Home Any stairs in or around the home?: No If so, are there any without handrails?: No Home free of loose throw rugs in  walkways, pet beds, electrical cords, etc?: Yes Adequate lighting in your home to reduce risk of falls?: Yes Life alert?: No Use of a cane, walker or w/c?: Yes Grab bars in the bathroom?: No Shower chair or bench in shower?: Yes Elevated toilet seat or a handicapped toilet?: No  TIMED UP AND GO:  Was the test performed?  No    Cognitive Function:        01/10/2023   11:09 AM 12/19/2021    9:09 AM 12/18/2020    6:34 PM  6CIT Screen  What Year? 0 points 0 points 0 points  What month? 0 points 0 points 0 points  What time? 0 points 0 points 0 points  Count back from 20 0 points 0 points 0 points  Months in reverse 0 points 0 points 0 points  Repeat phrase 0 points 2 points 0 points  Total Score 0 points 2 points 0 points    Immunizations Immunization History  Administered Date(s) Administered   Fluad Quad(high Dose 65+) 02/01/2020, 02/22/2022   Influenza-Unspecified 02/03/2021   PFIZER(Purple Top)SARS-COV-2 Vaccination 05/15/2019, 06/07/2019, 02/03/2021   Pneumococcal Conjugate-13 04/26/2015   Pneumococcal Polysaccharide-23 03/09/2014   Tdap 09/21/2021    TDAP status: Up to date  Flu Vaccine status: Due, Education has been provided regarding the importance of this vaccine. Advised may receive this vaccine at local pharmacy or Health Dept. Aware to provide a copy of the vaccination record if obtained from local pharmacy or Health Dept. Verbalized acceptance and understanding.  Pneumococcal vaccine status: Up to date  Covid-19 vaccine status: Information provided on how to obtain vaccines.   Qualifies for Shingles Vaccine? Yes   Zostavax completed No   Shingrix Completed?: No.    Education has been provided regarding the importance of this vaccine. Patient has been advised to call insurance company to determine out of pocket expense if they have not yet received this vaccine. Advised may also receive vaccine at local pharmacy or Health Dept. Verbalized acceptance and  understanding.  Screening Tests Health Maintenance  Topic Date Due   Hepatitis C Screening  Never done   Zoster Vaccines- Shingrix (1 of 2) Never done   Medicare Annual Wellness (AWV)  12/20/2022   COVID-19 Vaccine (4 - 2023-24 season) 12/22/2022   INFLUENZA VACCINE  07/21/2023 (Originally 11/21/2022)   DTaP/Tdap/Td (2 - Td or Tdap) 09/22/2031   Pneumonia Vaccine 17+ Years old  Completed   HPV VACCINES  Aged Out   Colonoscopy  Discontinued    Health Maintenance  Health Maintenance Due  Topic Date Due   Hepatitis C Screening  Never done   Zoster Vaccines- Shingrix (1 of 2) Never done   Medicare Annual Wellness (AWV)  12/20/2022   COVID-19 Vaccine (4 - 2023-24 season) 12/22/2022    Colorectal cancer screening: No longer required.   Lung Cancer Screening: (Low Dose CT Chest recommended if Age 26-80 years, 20 pack-year currently smoking OR have quit w/in 15years.) does not qualify.   Additional Screening:  Hepatitis C Screening: does qualify  Vision Screening: Recommended annual ophthalmology exams for early detection of glaucoma and other disorders of the eye. Is the patient up to date with  their annual eye exam?  No  Who is the provider or what is the name of the office in which the patient attends annual eye exams? N/a If pt is not established with a provider, would they like to be referred to a provider to establish care? No .   Dental Screening: Recommended annual dental exams for proper oral hygiene  Diabetic Foot Exam: n/a  Community Resource Referral / Chronic Care Management: CRR required this visit?  No   CCM required this visit?  No     Plan:     I have personally reviewed and noted the following in the patient's chart:   Medical and social history Use of alcohol, tobacco or illicit drugs  Current medications and supplements including opioid prescriptions. Patient is not currently taking opioid prescriptions. Functional ability and status Nutritional  status Physical activity Advanced directives List of other physicians Hospitalizations, surgeries, and ER visits in previous 12 months Vitals Screenings to include cognitive, depression, and falls Referrals and appointments  In addition, I have reviewed and discussed with patient certain preventive protocols, quality metrics, and best practice recommendations. A written personalized care plan for preventive services as well as general preventive health recommendations were provided to patient.     Jordan Hawks Courtne Lighty, CMA   01/10/2023   After Visit Summary: (MyChart) Due to this being a telephonic visit, the after visit summary with patients personalized plan was offered to patient via MyChart   Nurse Notes: n/a

## 2023-01-12 IMAGING — CT CT HEAD W/O CM
4 series · 16 of 47 positions shown, 18 images · non-contrast
Comparison: None Available.

CLINICAL DATA: Fall injury today with frontal head trauma.



[Series 2: head w o · axial · 0.44mm/px · z∈[+9,+129]mm · 7 of 33 slices shown, 9 images]
[im 5/33  brain]
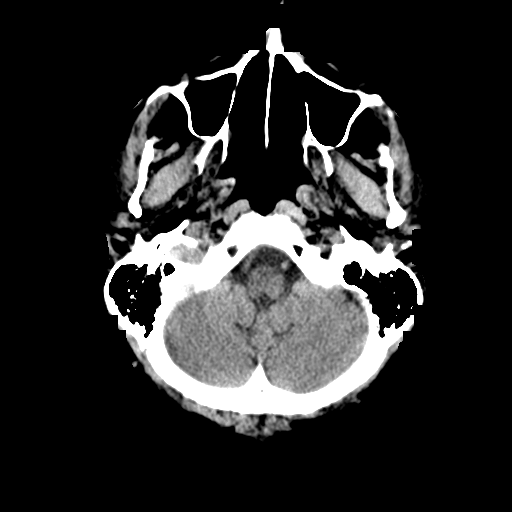
[im 5/33  bone]
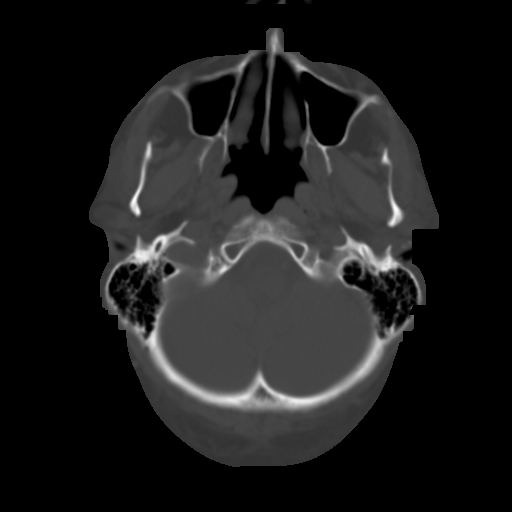
[im 9/33  brain]
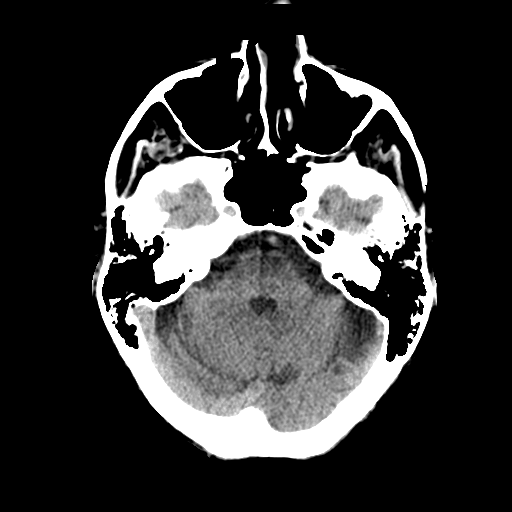
[im 13/33  brain]
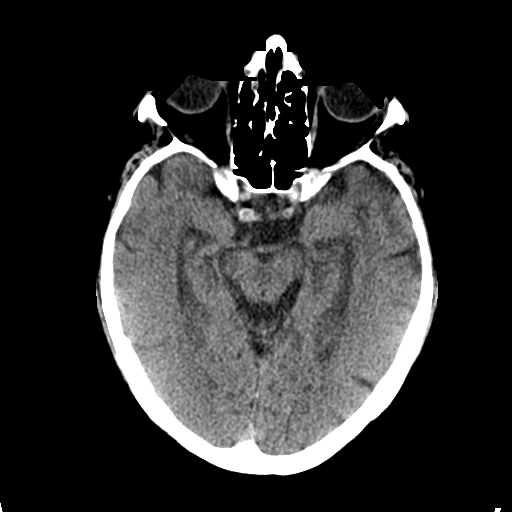
[im 17/33  brain]
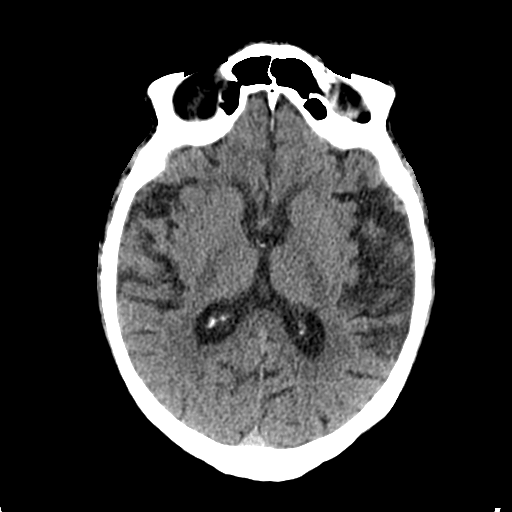
[im 21/33  brain]
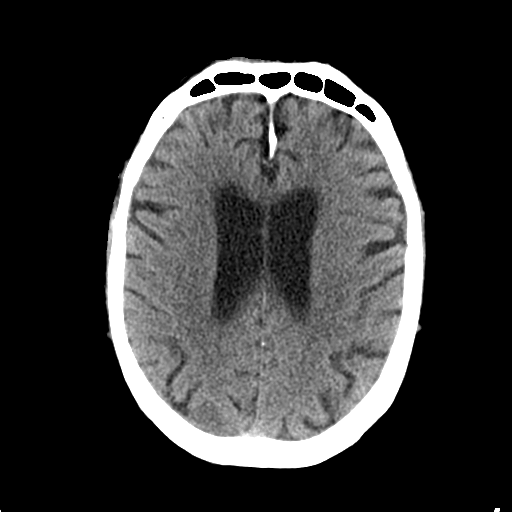
[im 21/33  bone]
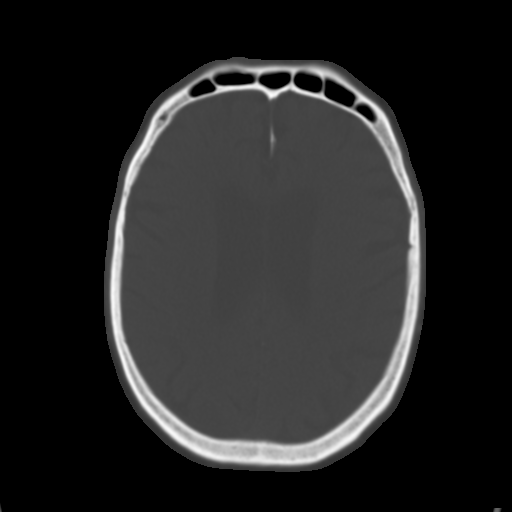
[im 25/33  brain]
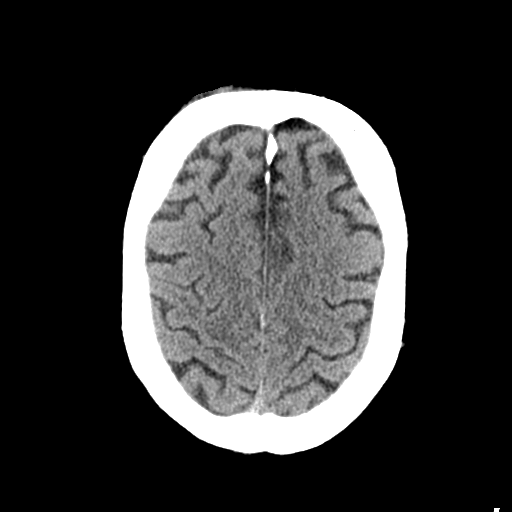
[im 29/33  brain]
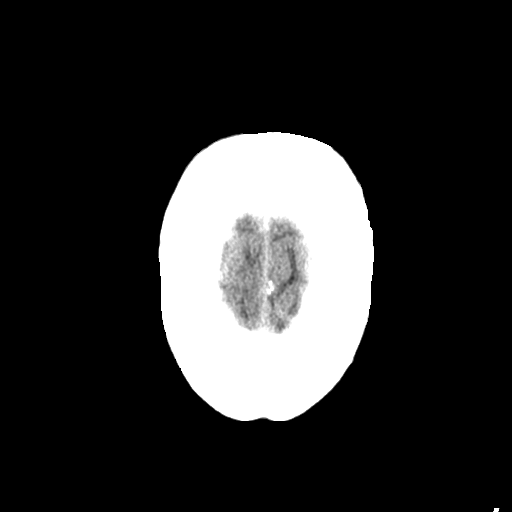

[Series 3: head bone · axial · 0.44mm/px · z∈[+5,+37]mm · 3 of 83 slices shown]
[im 9/83  bone]
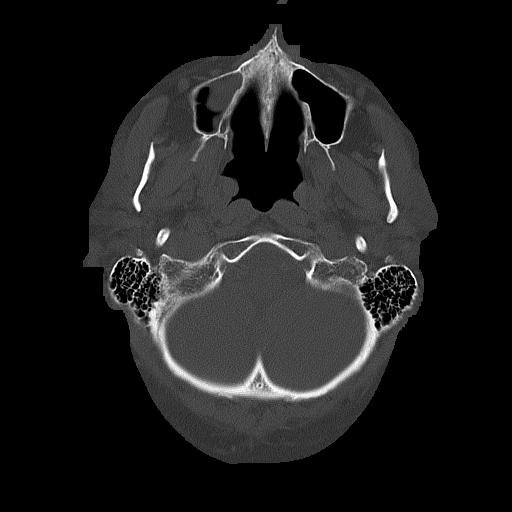
[im 17/83  bone]
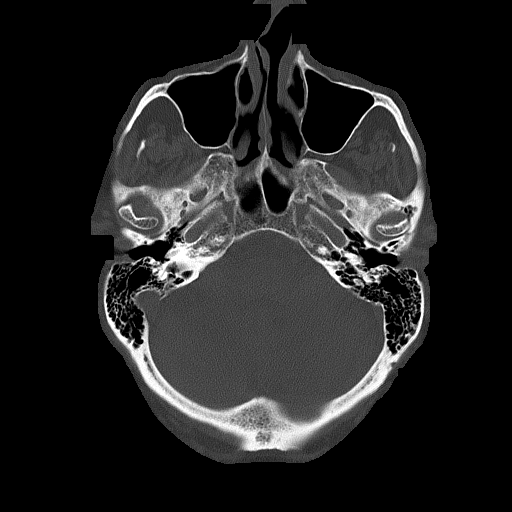
[im 25/83  bone]
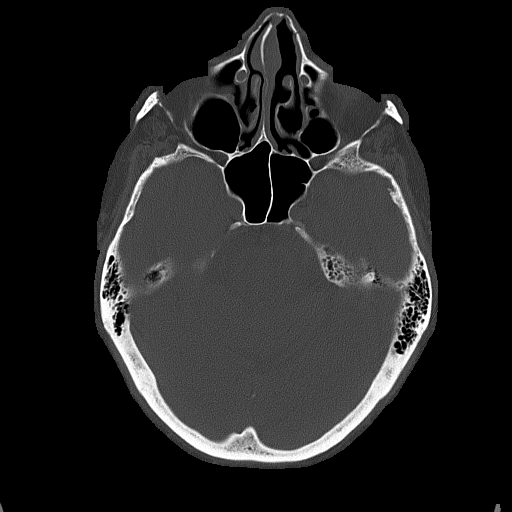

[Series 4: coronal soft · coronal · 0.32mm/px · 3 of 81 slices shown]
[im 27/81  brain]
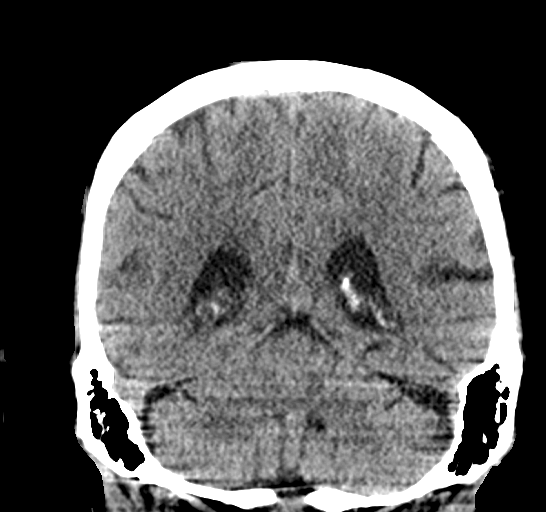
[im 36/81  brain]
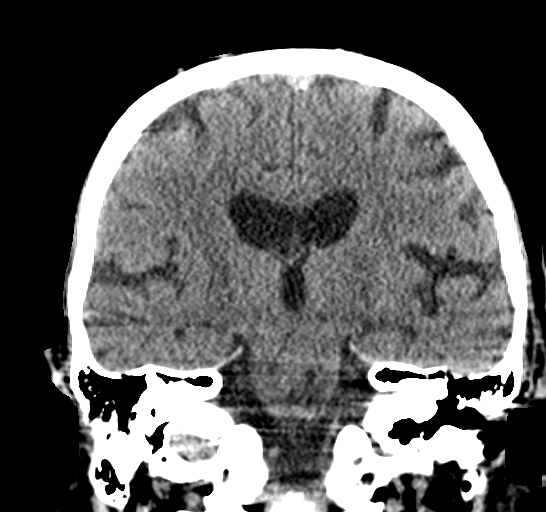
[im 45/81  brain]
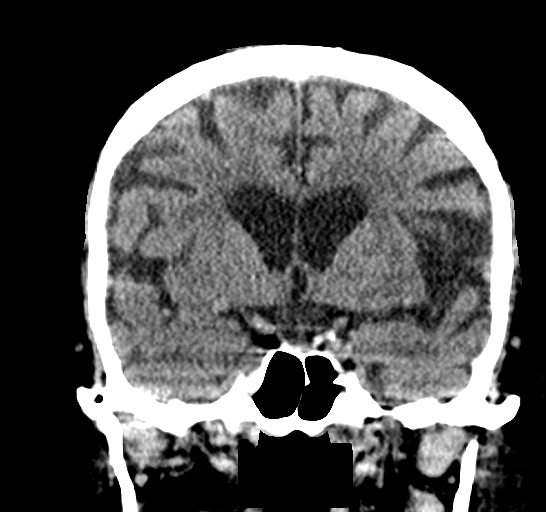

[Series 5: sagittal soft · sagittal · 0.38mm/px · 3 of 63 slices shown]
[im 21/63  brain]
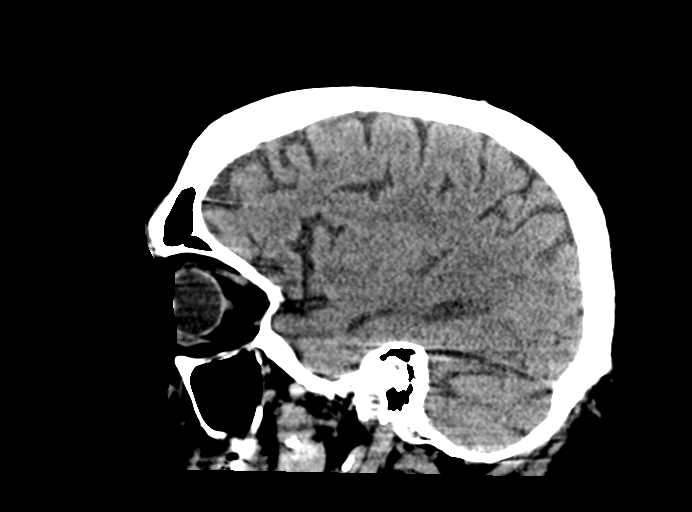
[im 32/63  brain]
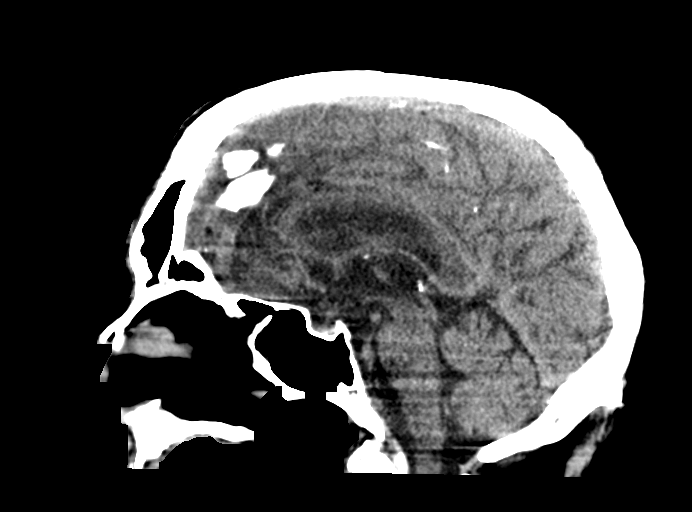
[im 42/63  brain]
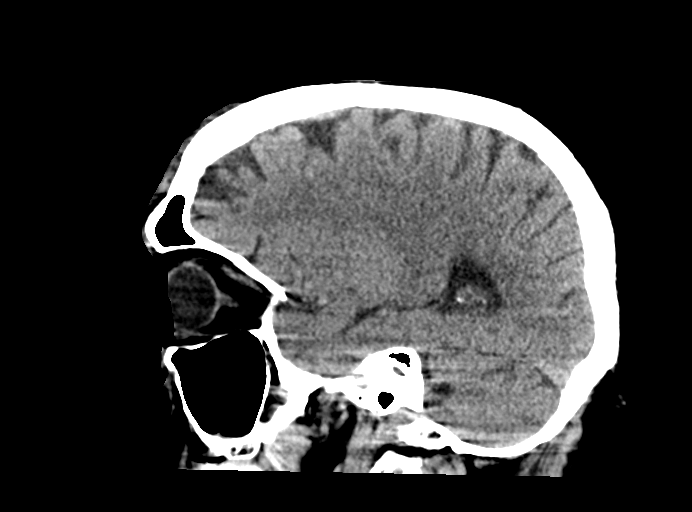

[16 of 47 positions shown; findings below may reference images not displayed]

FINDINGS: Brain: There is mild-to-moderate cerebral atrophy and atrophic
ventriculomegaly with mild small-vessel disease of the cerebral
white matter, and mild cerebellar atrophy.

No asymmetry is seen worrisome for acute infarct, hemorrhage or mass
and there is no midline shift.

Dystrophic benign dural calcifications are noted in the frontal
falx.

Vascular: No hyperdense vessel or unexpected calcification.

Skull: No fracture or focal lesion is seen. The calvarium, skull
base and orbits are intact.

There is a right forehead scalp hematoma with overlying skin
laceration.

Sinuses/Orbits: There is broad-based deviation of the nasal septum
to the right. There is mild membrane thickening in the ethmoids.
There are retention cysts in the floor of the right maxillary sinus.

Other sinuses are clear. No mastoid or middle ear effusion is seen.
Unremarkable orbital contents.

Other: None.
IMPRESSION: 1. No acute intracranial CT findings or depressed skull fractures.
2. Right forehead scalp hematoma and laceration.
3. Chronic changes.
4. Sinonasal findings described above.

## 2023-01-15 ENCOUNTER — Ambulatory Visit (INDEPENDENT_AMBULATORY_CARE_PROVIDER_SITE_OTHER): Payer: No Typology Code available for payment source

## 2023-01-15 ENCOUNTER — Ambulatory Visit: Payer: No Typology Code available for payment source

## 2023-01-15 DIAGNOSIS — Z23 Encounter for immunization: Secondary | ICD-10-CM | POA: Diagnosis not present

## 2023-01-17 ENCOUNTER — Inpatient Hospital Stay: Payer: No Typology Code available for payment source | Admitting: Oncology

## 2023-01-17 ENCOUNTER — Inpatient Hospital Stay: Payer: No Typology Code available for payment source

## 2023-01-20 ENCOUNTER — Inpatient Hospital Stay: Payer: No Typology Code available for payment source

## 2023-01-20 ENCOUNTER — Inpatient Hospital Stay (HOSPITAL_BASED_OUTPATIENT_CLINIC_OR_DEPARTMENT_OTHER): Payer: No Typology Code available for payment source | Admitting: Oncology

## 2023-01-20 ENCOUNTER — Encounter: Payer: Self-pay | Admitting: Oncology

## 2023-01-20 ENCOUNTER — Inpatient Hospital Stay: Payer: No Typology Code available for payment source | Attending: Oncology

## 2023-01-20 VITALS — BP 120/79 | HR 86 | Temp 96.2°F | Resp 18 | Ht 68.0 in | Wt 168.7 lb

## 2023-01-20 DIAGNOSIS — R296 Repeated falls: Secondary | ICD-10-CM | POA: Insufficient documentation

## 2023-01-20 DIAGNOSIS — Z87891 Personal history of nicotine dependence: Secondary | ICD-10-CM | POA: Diagnosis not present

## 2023-01-20 DIAGNOSIS — R0789 Other chest pain: Secondary | ICD-10-CM | POA: Insufficient documentation

## 2023-01-20 DIAGNOSIS — C61 Malignant neoplasm of prostate: Secondary | ICD-10-CM

## 2023-01-20 LAB — CBC WITH DIFFERENTIAL/PLATELET
Abs Immature Granulocytes: 0.03 10*3/uL (ref 0.00–0.07)
Basophils Absolute: 0 10*3/uL (ref 0.0–0.1)
Basophils Relative: 1 %
Eosinophils Absolute: 0.3 10*3/uL (ref 0.0–0.5)
Eosinophils Relative: 5 %
HCT: 41.7 % (ref 39.0–52.0)
Hemoglobin: 13.9 g/dL (ref 13.0–17.0)
Immature Granulocytes: 1 %
Lymphocytes Relative: 13 %
Lymphs Abs: 0.8 10*3/uL (ref 0.7–4.0)
MCH: 31.6 pg (ref 26.0–34.0)
MCHC: 33.3 g/dL (ref 30.0–36.0)
MCV: 94.8 fL (ref 80.0–100.0)
Monocytes Absolute: 0.6 10*3/uL (ref 0.1–1.0)
Monocytes Relative: 10 %
Neutro Abs: 4.1 10*3/uL (ref 1.7–7.7)
Neutrophils Relative %: 70 %
Platelets: 195 10*3/uL (ref 150–400)
RBC: 4.4 MIL/uL (ref 4.22–5.81)
RDW: 12.6 % (ref 11.5–15.5)
WBC: 5.8 10*3/uL (ref 4.0–10.5)
nRBC: 0 % (ref 0.0–0.2)

## 2023-01-20 LAB — COMPREHENSIVE METABOLIC PANEL
ALT: 14 U/L (ref 0–44)
AST: 16 U/L (ref 15–41)
Albumin: 4.1 g/dL (ref 3.5–5.0)
Alkaline Phosphatase: 84 U/L (ref 38–126)
Anion gap: 7 (ref 5–15)
BUN: 17 mg/dL (ref 8–23)
CO2: 25 mmol/L (ref 22–32)
Calcium: 9.2 mg/dL (ref 8.9–10.3)
Chloride: 104 mmol/L (ref 98–111)
Creatinine, Ser: 0.97 mg/dL (ref 0.61–1.24)
GFR, Estimated: >60 mL/min (ref >60)
Glucose, Bld: 102 mg/dL — ABNORMAL HIGH (ref 70–99)
Potassium: 4.8 mmol/L (ref 3.5–5.1)
Sodium: 136 mmol/L (ref 135–145)
Total Bilirubin: 0.5 mg/dL (ref 0.3–1.2)
Total Protein: 7 g/dL (ref 6.5–8.1)

## 2023-01-20 LAB — PSA: Prostatic Specific Antigen: 0.18 ng/mL (ref 0.00–4.00)

## 2023-01-20 NOTE — Progress Notes (Signed)
Hematology/Oncology Consult note 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  I do not have the biopsy report from 7 years ago.  He received I-125 interstitial implant for the same.  His presenting PSA at that time was 8.7.  He was last given a dose of Eligard in March 2021 following which his PSA dropped down from 5.28-0.27.  He did not receive any Eligard since then and PSA went back up to 4.7   He has been referred to medical oncology for consideration of restarting ADT.   Lupron restarted in March 2022  Interval history-patient lives alone and has had multiple falls in the last 1 to 2 months.  He will be seeing his primary care Dr. Gerda Diss soon.  He reports left-sided rib cage pain since his fall.  ECOG PS- 2 Pain scale- 4   Review of systems- Review of Systems  Constitutional:  Negative for chills, fever, malaise/fatigue and weight loss.  HENT:  Negative for congestion, ear discharge and nosebleeds.   Eyes:  Negative for blurred vision.  Respiratory:  Negative for cough, hemoptysis, sputum production, shortness of breath and wheezing.   Cardiovascular:  Negative for chest pain, palpitations, orthopnea and claudication.  Gastrointestinal:  Negative for abdominal pain, blood in stool, constipation, diarrhea, heartburn, melena, nausea and vomiting.  Genitourinary:  Negative for dysuria, flank pain, frequency, hematuria and urgency.  Musculoskeletal:   Negative for back pain, joint pain and myalgias.  Skin:  Negative for rash.  Neurological:  Negative for dizziness, tingling, focal weakness, seizures, weakness and headaches.  Endo/Heme/Allergies:  Does not bruise/bleed easily.  Psychiatric/Behavioral:  Negative for depression and suicidal ideas. The patient does not have insomnia.       Allergies  Allergen Reactions   Augmentin [Amoxicillin-Pot Clavulanate]     Gi upset   Ciprofloxacin    Nsaids Other (See Comments)    Stomach ulcer    Rosuvastatin     Myalgias     Past Medical History:  Diagnosis Date   Allergy    trees and plants   Anemia    IDA   Anxiety    Asthma 1998   Diverticulitis    Elevated PSA    GERD (gastroesophageal reflux disease)    Hyperlipidemia    Hypothyroidism 03/16/2019   Prostate cancer [facility])      Past Surgical History:  Procedure Laterality Date   HERNIA REPAIR  1999   prostate cancer     TRANSURETHRAL RESECTION OF PROSTATE     VASECTOMY      Social History   Socioeconomic History   Marital status: Married    Spouse name: Not on file   Number of children: Not on file   Years of education: Not on file   Highest education level: Not on file  Occupational History   Not on file  Tobacco Use   Smoking status: Former    Current packs/day: 0.00    Types: Cigarettes    Quit date: 04/22/1982  Years since quitting: 40.7   Smokeless tobacco: Never  Vaping Use   Vaping status: Never Used  Substance and Sexual Activity   Alcohol use: Yes    Alcohol/week: 2.0 standard drinks of alcohol    Types: 2 Shots of liquor per week    Comment: bourbon every night   Drug use: Not Currently   Sexual activity: Not Currently  Other Topics Concern   Not on file  Social History Narrative   Not on file   Social Determinants of Health   Financial Resource Strain: Low Risk  (01/10/2023)   Overall Financial Resource Strain (CARDIA)    Difficulty of Paying Living Expenses: Not hard at all  Food  Insecurity: No Food Insecurity (01/10/2023)   Hunger Vital Sign    Worried About Running Out of Food in the Last Year: Never true    Ran Out of Food in the Last Year: Never true  Transportation Needs: No Transportation Needs (01/10/2023)   PRAPARE - Administrator, Civil Service (Medical): No    Lack of Transportation (Non-Medical): No  Physical Activity: Inactive (01/10/2023)   Exercise Vital Sign    Days of Exercise per Week: 0 days    Minutes of Exercise per Session: 0 min  Stress: No Stress Concern Present (01/10/2023)   [employer] of Occupational Health - Occupational Stress Questionnaire    Feeling of Stress : Not at all  Social Connections: Moderately Integrated (01/10/2023)   Social Connection and Isolation Panel [NHANES]    Frequency of Communication with Friends and Family: More than three times a week    Frequency of Social Gatherings with Friends and Family: More than three times a week    Attends Religious Services: More than 4 times per year    Active Member of [employer] or Organizations: No    Attends Banker Meetings: Never    Marital Status: Married  Catering manager Violence: Not At Risk (01/10/2023)   Humiliation, Afraid, Rape, and Kick questionnaire    Fear of Current or Ex-Partner: No    Emotionally Abused: No    Physically Abused: No    Sexually Abused: No    Family History  Problem Relation Age of Onset   Heart disease Father    Hypertension Brother    Diabetes Brother      Current Outpatient Medications:    acetaminophen (TYLENOL) 500 MG tablet, Take 500 mg by mouth every 6 (six) hours as needed., Disp: , Rfl:    albuterol (VENTOLIN HFA) 108 (90 Base) MCG/ACT inhaler, INHALE 2 PUFFS BY MOUTH EVERY 6 HOURS AS NEEDED FOR WHEEZING, Disp: 9 g, Rfl: 2   Ascorbic Acid (VITAMIN C) 100 MG tablet, Take 100 mg by mouth daily., Disp: , Rfl:    BREO ELLIPTA 200-25 MCG/ACT AEPB, 1 puff every morning., Disp: , Rfl:    co-enzyme Q-10 30 MG  capsule, Take 30 mg by mouth daily., Disp: , Rfl:    ferrous sulfate 325 (65 FE) MG tablet, Take 325 mg by mouth daily with breakfast., Disp: , Rfl:    fexofenadine (ALLEGRA) 180 MG tablet, Take 180 mg by mouth daily., Disp: , Rfl:    FIBER PO, Take 1 tablet by mouth daily., Disp: , Rfl:    fluticasone (FLONASE) 50 MCG/ACT nasal spray, Place 2 sprays into both nostrils daily., Disp: 16 g, Rfl: 4   hydrocortisone 2.5 % cream, Apply bid prn itching, Disp: 60 g, Rfl: 1   levothyroxine (SYNTHROID) 50  MCG tablet, Take 1 1/2 tablets on M W Fri Sat Sun and take 1 on Tues and Thursday, Disp: 120 tablet, Rfl: 5   Melatonin 1 MG TABS, Take 5 mg by mouth at bedtime as needed., Disp: , Rfl:    Multiple Vitamins-Minerals (MULTIVITAMIN WITH MINERALS) tablet, Take 1 tablet by mouth daily., Disp: , Rfl:    nitroGLYCERIN (NITROSTAT) 0.4 MG SL tablet, Place 1 tablet (0.4 mg total) under the tongue every 5 (five) minutes as needed for chest pain., Disp: 30 tablet, Rfl: 5   Omega-3 Fatty Acids (FISH OIL) 1000 MG CAPS, Take 1 capsule by mouth daily., Disp: , Rfl:    predniSONE (DELTASONE) 20 MG tablet, 2 qd, Disp: 10 tablet, Rfl: 0   sertraline (ZOLOFT) 100 MG tablet, Take 2 tablets (200 mg total) by mouth daily., Disp: 60 tablet, Rfl: 5   Red Yeast Rice Extract 300 MG CAPS, Take 1 tablet by mouth daily. (Patient not taking: Reported on 01/20/2023), Disp: , Rfl:  No current facility-administered medications for this visit.  Facility-Administered Medications Ordered in Other Visits:    leuprolide (6 Month) (ELIGARD) injection 45 mg, 45 mg, Subcutaneous, Q6 months, Creig Hines, MD, 45 mg at 01/11/22 1206   leuprolide (6 Month) (ELIGARD) injection 45 mg, 45 mg, Subcutaneous, Q6 months, Creig Hines, MD, 45 mg at 07/12/22 1051  Physical exam:  Vitals:   01/20/23 0945  BP: 120/79  Pulse: 86  Resp: 18  Temp: (!) 96.2 F (35.7 C)  TempSrc: Tympanic  SpO2: 98%  Weight: 168 lb 11.2 oz (76.5 kg)  Height: 5\' 8"   (1.727 m)   Physical Exam Cardiovascular:     Rate and Rhythm: Normal rate and regular rhythm.     Heart sounds: Normal heart sounds.  Pulmonary:     Effort: Pulmonary effort is normal.     Breath sounds: Normal breath sounds.  Skin:    General: Skin is warm and dry.  Neurological:     Mental Status: He is alert and oriented to person, place, and time.         Latest Ref Rng & Units 01/20/2023    9:20 AM  CMP  Glucose 70 - 99 mg/dL 865   BUN 8 - 23 mg/dL 17   Creatinine 7.84 - 1.24 mg/dL 6.96   Sodium 295 - 284 mmol/L 136   Potassium 3.5 - 5.1 mmol/L 4.8   Chloride 98 - 111 mmol/L 104   CO2 22 - 32 mmol/L 25   Calcium 8.9 - 10.3 mg/dL 9.2   Total Protein 6.5 - 8.1 g/dL 7.0   Total Bilirubin 0.3 - 1.2 mg/dL 0.5   Alkaline Phos 38 - 126 U/L 84   AST 15 - 41 U/L 16   ALT 0 - 44 U/L 14       Latest Ref Rng & Units 01/20/2023    9:20 AM  CBC  WBC 4.0 - 10.5 K/uL 5.8   Hemoglobin 13.0 - 17.0 g/dL 13.2   Hematocrit 44.0 - 52.0 % 41.7   Platelets 150 - 400 K/uL 195      Assessment and plan- Patient is a 77 y.o. male with nonmetastatic castrate sensitive prostate cancer s/p 2 years of ADT here for routine  follow-up  Patient last received 45 mg of Lupron in March 2024.  At that time his PSA was 0.23 stable as compared to levels in September 2022 when they were 0.29.  PSA from today is pending.  Overall  his PSA remains stable I plan to watch him off ADT given that he has nonmetastatic castrate sensitive disease.  However if there is a continued increase in his PSA I will restart his ADT at that time.  Frequent falls: He will be discussing this further with Dr. Gerda Diss his primary care doctor  CBC with differential CMP PSA in 3 months in 6 months.  See Dr. Smith Robert in 6 months   Visit Diagnosis 1. Malignant neoplasm of prostate (HCC)   2. Prostate cancer [facility])      Dr. Owens Shark, MD, MPH [facility] [phone] 01/20/2023 1:15 PM

## 2023-01-27 ENCOUNTER — Ambulatory Visit: Payer: No Typology Code available for payment source | Admitting: Family Medicine

## 2023-02-04 DIAGNOSIS — F17211 Nicotine dependence, cigarettes, in remission: Secondary | ICD-10-CM | POA: Diagnosis not present

## 2023-02-04 DIAGNOSIS — J449 Chronic obstructive pulmonary disease, unspecified: Secondary | ICD-10-CM | POA: Diagnosis not present

## 2023-02-04 DIAGNOSIS — F322 Major depressive disorder, single episode, severe without psychotic features: Secondary | ICD-10-CM | POA: Diagnosis not present

## 2023-02-04 DIAGNOSIS — Z008 Encounter for other general examination: Secondary | ICD-10-CM | POA: Diagnosis not present

## 2023-02-04 DIAGNOSIS — Z6825 Body mass index (BMI) 25.0-25.9, adult: Secondary | ICD-10-CM | POA: Diagnosis not present

## 2023-02-04 DIAGNOSIS — E663 Overweight: Secondary | ICD-10-CM | POA: Diagnosis not present

## 2023-02-07 DIAGNOSIS — H52223 Regular astigmatism, bilateral: Secondary | ICD-10-CM | POA: Diagnosis not present

## 2023-02-07 DIAGNOSIS — H524 Presbyopia: Secondary | ICD-10-CM | POA: Diagnosis not present

## 2023-02-11 ENCOUNTER — Ambulatory Visit (INDEPENDENT_AMBULATORY_CARE_PROVIDER_SITE_OTHER): Payer: No Typology Code available for payment source | Admitting: Family Medicine

## 2023-02-11 ENCOUNTER — Other Ambulatory Visit: Payer: Self-pay | Admitting: *Deleted

## 2023-02-11 VITALS — BP 137/80 | HR 89 | Temp 98.4°F | Wt 173.4 lb

## 2023-02-11 DIAGNOSIS — E782 Mixed hyperlipidemia: Secondary | ICD-10-CM

## 2023-02-11 DIAGNOSIS — E038 Other specified hypothyroidism: Secondary | ICD-10-CM | POA: Diagnosis not present

## 2023-02-11 DIAGNOSIS — S060X1A Concussion with loss of consciousness of 30 minutes or less, initial encounter: Secondary | ICD-10-CM

## 2023-02-11 MED ORDER — LEVOTHYROXINE SODIUM 50 MCG PO TABS: ORAL_TABLET | ORAL | 0 refills | Status: DC

## 2023-02-11 MED ORDER — SERTRALINE HCL 100 MG PO TABS: 200.0000 mg | ORAL_TABLET | Freq: Every day | ORAL | 0 refills | Status: DC

## 2023-02-11 NOTE — Progress Notes (Signed)
Subjective:    Patient ID: Thomas [name], male    DOB: Nov 06, 1945, 77 y.o.   MRN: [id number]  Discussed the use of AI scribe software for clinical note transcription with the patient, who gave verbal consent to proceed.  History of Present Illness   The patient, known as Thomas [name], is a 77 year old male with a history of cancer, currently under the care of an oncologist. He presented with a recent history of falls, which have resulted in significant head trauma. The most recent fall occurred on a Saturday, where he slipped on the bathroom floor and hit his head. He lost consciousness for a brief period, but did not experience any vomiting. Upon regaining consciousness, he reported a persistent headache, which was still present at the time of the consultation. He did not report any other symptoms such as nausea or balance issues since the fall.  In addition to his falls, Thomas [name] reported a general feeling of weakness, particularly when getting up from a seated position. He attributed this to aging and the reduction in muscle mass. Despite these challenges, he remains active in his community and church, and engages in regular activities such as cooking, watching TV, and participating in ministry work.  Mike's cancer treatment appears to be going well, with his PSA levels remaining stable. He reported feeling gradually better each day, and attributed part of this improvement to the medication prescribed by his doctor. Despite living alone and experiencing some feelings of loneliness, Thomas [name] reported that his mood has been improving.      Patient does state that when he fell he struck his head and he was knocked out he is uncertain how long he was out for no loss of bowel or bladder control  Review of Systems     Objective:    Physical Exam   VITALS: BP- 120/68, BP- 114/68 CHEST: Lungs clear to auscultation. CARDIOVASCULAR: Heart rhythm regular. SKIN: Bruising on left upper part, left temporal  part, and cheekbone.     Patient has significant bruising on the left forehead the left cheekbone as well as the temporal region No unilateral numbness or weakness      Assessment & Plan:  Assessment and Plan    Fall with Head Trauma Recent fall with head trauma resulting in persistent headache and bruising on the left upper part, left temporal part, and cheekbone. Short loss of consciousness, vomiting, or other neurological symptoms. Possible trip hazard identified at home. -Order head CT to rule out intracranial injury. -Review fall prevention strategies and provide exercises to improve balance. with ongoing headache need to rule out subdural hematoma  Prostate Cancer Stable with low PSA (0.18 in September). Under the care of Dr. Smith Robert, an oncologist. -Continue current management plan with Dr. Smith Robert.  Depression Improved mood, possibly related to medication and adaptation to living alone. -Continue current management plan.  General Health Maintenance -Order routine blood work. -Follow-up after head CT results are available.      With the mild loss of consciousness need to have CT scan to rule out the possibility of subdural hematoma

## 2023-02-12 ENCOUNTER — Encounter: Payer: Self-pay | Admitting: Family Medicine

## 2023-02-12 ENCOUNTER — Ambulatory Visit (HOSPITAL_COMMUNITY): Admission: RE | Admit: 2023-02-12 | Payer: No Typology Code available for payment source | Source: Ambulatory Visit

## 2023-02-12 DIAGNOSIS — F320 Major depressive disorder, single episode, mild: Secondary | ICD-10-CM | POA: Diagnosis not present

## 2023-02-12 DIAGNOSIS — Z008 Encounter for other general examination: Secondary | ICD-10-CM | POA: Diagnosis not present

## 2023-02-12 DIAGNOSIS — F17211 Nicotine dependence, cigarettes, in remission: Secondary | ICD-10-CM | POA: Diagnosis not present

## 2023-02-12 DIAGNOSIS — R2681 Unsteadiness on feet: Secondary | ICD-10-CM | POA: Diagnosis not present

## 2023-02-12 DIAGNOSIS — E663 Overweight: Secondary | ICD-10-CM | POA: Diagnosis not present

## 2023-02-12 DIAGNOSIS — E039 Hypothyroidism, unspecified: Secondary | ICD-10-CM | POA: Diagnosis not present

## 2023-02-12 DIAGNOSIS — Z6825 Body mass index (BMI) 25.0-25.9, adult: Secondary | ICD-10-CM | POA: Diagnosis not present

## 2023-02-12 LAB — TSH+FREE T4
Free T4: 0.99 ng/dL (ref 0.82–1.77)
TSH: 2.27 u[IU]/mL (ref 0.450–4.500)

## 2023-02-12 NOTE — Progress Notes (Signed)
Please mail.

## 2023-02-21 ENCOUNTER — Ambulatory Visit (INDEPENDENT_AMBULATORY_CARE_PROVIDER_SITE_OTHER): Payer: No Typology Code available for payment source | Admitting: Family Medicine

## 2023-02-21 ENCOUNTER — Encounter: Payer: Self-pay | Admitting: Family Medicine

## 2023-02-21 VITALS — BP 130/72 | HR 91 | Wt 171.6 lb

## 2023-02-21 DIAGNOSIS — R2689 Other abnormalities of gait and mobility: Secondary | ICD-10-CM | POA: Diagnosis not present

## 2023-02-21 DIAGNOSIS — Z9181 History of falling: Secondary | ICD-10-CM | POA: Diagnosis not present

## 2023-02-21 DIAGNOSIS — S060X0D Concussion without loss of consciousness, subsequent encounter: Secondary | ICD-10-CM

## 2023-02-21 NOTE — Progress Notes (Signed)
   Subjective:    Patient ID: Thomas [name], male    DOB: [birth year], 77 y.o.   MRN: [id number]  Discussed the use of AI scribe software for clinical note transcription with the patient, who gave verbal consent to proceed.  History of Present Illness   The patient, with a history of falls and head injury, reports no recent falls or injuries since the last visit. He notes that the bruising from the previous head injury has resolved. He reports some difficulty with steadiness, particularly at night, but has been more cautious since the last fall. He uses a cane for support when walking, both inside and outside the house.  The patient's appetite is good, although he notes he does not eat as much as he used to. He occasionally experiences dizziness upon standing and has been dealing with sinus congestion. The patient also mentions concerns about his living situation and lack of immediate family support, which causes some anxiety. He remains socially active through his church community.         Review of Systems     Objective:    Physical Exam   VITALS: BP- 130/74 CHEST: Lungs clear to auscultation. CARDIOVASCULAR: Heart sounds normal on auscultation.           Assessment & Plan:  Assessment and Plan    Fall Risk History of falls with resultant bruising. Improved caution with ambulation and use of cane for support. Occasional dizziness on standing. -Continue use of cane for support during ambulation. -Implement balance exercises three times weekly to improve stability and reduce fall risk.  Sinus Congestion Reports of stuffiness. No associated imbalance. -Continue current management.  General Health Maintenance Good appetite and social engagement with church community. -Continue healthy eating habits and social activities. -Follow-up in four months.      Overall I feel the patient is trying to do the best he can, we did discuss some balance exercises

## 2023-03-07 ENCOUNTER — Other Ambulatory Visit: Payer: Self-pay | Admitting: Family Medicine

## 2023-03-12 ENCOUNTER — Telehealth: Payer: Self-pay

## 2023-03-12 ENCOUNTER — Other Ambulatory Visit: Payer: Self-pay

## 2023-03-12 MED ORDER — LEVOTHYROXINE SODIUM 50 MCG PO TABS: ORAL_TABLET | ORAL | 0 refills | Status: DC

## 2023-03-12 NOTE — Telephone Encounter (Signed)
Pt came in today to inform us that the Levothyroxine was sent to [facility] and needs to be sent into [facility] in [place]. Sent in Rx to [facility]

## 2023-04-25 ENCOUNTER — Inpatient Hospital Stay: Payer: No Typology Code available for payment source

## 2023-04-29 ENCOUNTER — Inpatient Hospital Stay: Payer: PPO | Attending: Oncology

## 2023-04-29 DIAGNOSIS — C61 Malignant neoplasm of prostate: Secondary | ICD-10-CM | POA: Diagnosis not present

## 2023-04-29 LAB — COMPREHENSIVE METABOLIC PANEL
ALT: 17 U/L (ref 0–44)
AST: 20 U/L (ref 15–41)
Albumin: 4.2 g/dL (ref 3.5–5.0)
Alkaline Phosphatase: 71 U/L (ref 38–126)
Anion gap: 8 (ref 5–15)
BUN: 22 mg/dL (ref 8–23)
CO2: 24 mmol/L (ref 22–32)
Calcium: 9.1 mg/dL (ref 8.9–10.3)
Chloride: 107 mmol/L (ref 98–111)
Creatinine, Ser: 0.98 mg/dL (ref 0.61–1.24)
GFR, Estimated: >60 mL/min (ref >60)
Glucose, Bld: 108 mg/dL — ABNORMAL HIGH (ref 70–99)
Potassium: 4.2 mmol/L (ref 3.5–5.1)
Sodium: 139 mmol/L (ref 135–145)
Total Bilirubin: 0.4 mg/dL (ref 0.0–1.2)
Total Protein: 6.8 g/dL (ref 6.5–8.1)

## 2023-04-29 LAB — CBC WITH DIFFERENTIAL/PLATELET
Abs Immature Granulocytes: 0.03 10*3/uL (ref 0.00–0.07)
Basophils Absolute: 0.1 10*3/uL (ref 0.0–0.1)
Basophils Relative: 1 %
Eosinophils Absolute: 0.5 10*3/uL (ref 0.0–0.5)
Eosinophils Relative: 7 %
HCT: 40.4 % (ref 39.0–52.0)
Hemoglobin: 13.4 g/dL (ref 13.0–17.0)
Immature Granulocytes: 1 %
Lymphocytes Relative: 14 %
Lymphs Abs: 0.9 10*3/uL (ref 0.7–4.0)
MCH: 31.1 pg (ref 26.0–34.0)
MCHC: 33.2 g/dL (ref 30.0–36.0)
MCV: 93.7 fL (ref 80.0–100.0)
Monocytes Absolute: 0.7 10*3/uL (ref 0.1–1.0)
Monocytes Relative: 11 %
Neutro Abs: 4.4 10*3/uL (ref 1.7–7.7)
Neutrophils Relative %: 66 %
Platelets: 185 10*3/uL (ref 150–400)
RBC: 4.31 MIL/uL (ref 4.22–5.81)
RDW: 13.2 % (ref 11.5–15.5)
WBC: 6.5 10*3/uL (ref 4.0–10.5)
nRBC: 0 % (ref 0.0–0.2)

## 2023-04-29 LAB — PSA: Prostatic Specific Antigen: 0.43 ng/mL (ref 0.00–4.00)

## 2023-05-05 ENCOUNTER — Encounter: Payer: Self-pay | Admitting: Oncology

## 2023-05-05 ENCOUNTER — Ambulatory Visit (INDEPENDENT_AMBULATORY_CARE_PROVIDER_SITE_OTHER): Payer: No Typology Code available for payment source | Admitting: Family Medicine

## 2023-05-05 VITALS — BP 128/78 | HR 76 | Temp 97.2°F | Ht 68.0 in | Wt 168.0 lb

## 2023-05-05 DIAGNOSIS — E038 Other specified hypothyroidism: Secondary | ICD-10-CM

## 2023-05-05 DIAGNOSIS — D692 Other nonthrombocytopenic purpura: Secondary | ICD-10-CM

## 2023-05-05 DIAGNOSIS — R413 Other amnesia: Secondary | ICD-10-CM | POA: Diagnosis not present

## 2023-05-05 DIAGNOSIS — M47812 Spondylosis without myelopathy or radiculopathy, cervical region: Secondary | ICD-10-CM

## 2023-05-05 MED ORDER — SERTRALINE HCL 100 MG PO TABS: 200.0000 mg | ORAL_TABLET | Freq: Every day | ORAL | 1 refills | Status: DC

## 2023-05-05 MED ORDER — LEVOTHYROXINE SODIUM 50 MCG PO TABS: ORAL_TABLET | ORAL | 3 refills | Status: AC

## 2023-05-05 NOTE — Progress Notes (Signed)
   Subjective:    Patient ID: Thomas [name], male    DOB: June 13, 1945, 78 y.o.   MRN: [id number]  Discussed the use of AI scribe software for clinical note transcription with the patient, who gave verbal consent to proceed.  History of Present Illness   The patient, with a history of thyroid  disease, presents with concerns about living alone and managing his health. He reports being able to perform daily activities such as grocery shopping and visiting local restaurants. He manages his medications independently, keeping them in their original bottles. However, he has noted some medications have expired and need refills.  The patient also reports financial constraints limiting his ability to drive extensively. He has experienced instances of getting lost, attributing this to a fading memory. Despite these challenges, he reports maintaining his independence and managing his health reasonably well.  The patient has been experiencing neck pain for the past two to three weeks, describing it as sore and aching. The pain is localized and does not radiate. He has been managing the pain with aspirin. He also reports frequent falls, resulting in bruising on his arm. He has noticed increased difficulty in standing up from a seated position.  The patient has been exploring senior living options but has not found a suitable solution yet. He expresses feelings of loneliness living alone and is open to child psychotherapist assistance to explore more options. He also reports issues with his phone, finding it complicated to use. He has been experiencing itching, which he attributes to his nerves.         Review of Systems     Objective:    Physical Exam   VITALS: BP- 120/64 CHEST: lungs clear to auscultation CARDIOVASCULAR: heart sounds normal NEUROLOGICAL: remembered two out of three items, good performance on clock drawing test SKIN: bruising on arm           Assessment & Plan:  Assessment and Plan     Neck Pain New onset of neck pain for the past 2-3 weeks. No radiation of pain. Pain increases with movement. Likely cervical degenerative disc disease. -Advise to take Tylenol  as needed for pain.  Falls Reports of frequent falls and bruising. -Encourage safety measures at home to prevent falls.  Social Isolation Living alone and managing daily activities independently. Some memory issues reported. -Plan to have a child psychotherapist reach out to discuss potential senior living solutions and social activities.  Medication Management Reports keeping up with medications, but some have run out. -Ensure refills are provided for necessary medications.  Follow-up in 4 months.      1. Other specified hypothyroidism (Primary) Continue thyroid  medicine healthy diet  2. Cervical arthritis Tylenol  as needed gentle range of motion  3. Poor short-term memory Patient passes mini cognitive test recalls 2 out of 3 able to draw clock Patient safe to drive but he has not been encouraged to drive mainly daytime driving short distances  4. Senile purpura (HCC) Mild left arm  We will also have social service worker consulted to talk with him about if there are any senior living options he feels that he is no longer doing well in his own home finds it very lonely states he would do better if he was living around other people does not have much of a family in this area that would look after him I did give him information regarding [facility] [place] located near [employer]

## 2023-05-07 ENCOUNTER — Other Ambulatory Visit: Payer: Self-pay

## 2023-05-07 DIAGNOSIS — R2689 Other abnormalities of gait and mobility: Secondary | ICD-10-CM

## 2023-05-07 DIAGNOSIS — Z609 Problem related to social environment, unspecified: Secondary | ICD-10-CM

## 2023-05-08 ENCOUNTER — Telehealth: Payer: Self-pay | Admitting: *Deleted

## 2023-05-08 NOTE — Progress Notes (Signed)
Complex Care Management Note  Care Guide Note 05/08/2023 Name: Thomas [name] MRN: [id number] DOB: 1945-07-15  Thomas [name] is a 78 y.o. year old male who sees [name], [name], MD for primary care. I reached out to [name] by phone today to offer complex care management services.  [name] was given information about Complex Care Management services today including:   The Complex Care Management services include support from the care team which includes your Nurse Coordinator, Clinical Social Worker, or Pharmacist.  The Complex Care Management team is here to help remove barriers to the health concerns and goals most important to you. Complex Care Management services are voluntary, and the patient may decline or stop services at any time by request to their care team member.   Complex Care Management Consent Status: Patient agreed to services and verbal consent obtained.   Follow up plan:  Telephone appointment with complex care management team member scheduled for:  05/15/23  Encounter Outcome:  Patient Scheduled  Gwenevere Ghazi  [facility]  [facility] Guide  Direct Dial: [phone]  Fax [phone]

## 2023-05-15 ENCOUNTER — Encounter: Payer: Self-pay | Admitting: *Deleted

## 2023-05-15 ENCOUNTER — Ambulatory Visit: Payer: Self-pay | Admitting: *Deleted

## 2023-05-15 DIAGNOSIS — K219 Gastro-esophageal reflux disease without esophagitis: Secondary | ICD-10-CM

## 2023-05-15 DIAGNOSIS — M791 Myalgia, unspecified site: Secondary | ICD-10-CM

## 2023-05-15 DIAGNOSIS — E038 Other specified hypothyroidism: Secondary | ICD-10-CM

## 2023-05-15 DIAGNOSIS — G8929 Other chronic pain: Secondary | ICD-10-CM

## 2023-05-15 DIAGNOSIS — E782 Mixed hyperlipidemia: Secondary | ICD-10-CM

## 2023-05-15 DIAGNOSIS — F321 Major depressive disorder, single episode, moderate: Secondary | ICD-10-CM

## 2023-05-15 DIAGNOSIS — C61 Malignant neoplasm of prostate: Secondary | ICD-10-CM

## 2023-05-15 NOTE — Patient Outreach (Signed)
Care Coordination   Initial Visit Note   05/15/2023  Name: Thomas [name] MRN: [id number] DOB: March 24, 1946  Thomas [name] is a 78 y.o. year old male who sees [name], [name], MD for primary care. I spoke with Thomas [name] by phone today.  What matters to the patients health and wellness today?  Find Help in My Community.   Goals Addressed               This Visit's Progress     Find Help in My Community. (pt-stated)   On track     Care Coordination Interventions:  Interventions Today    Flowsheet Row Most Recent Value  Chronic Disease   Chronic disease during today's visit Other, Hypertension (HTN)  [Hypothyroidism, Prostate Cancer, Myalgia Due to Statin, Insomnia, Moderate Major Depression, Single Episode, Chronic Bilateral Low Back Pain without Sciatica, Limited Family Support, Unsteady Balance/Gait, Hyperlipidemia, Financial Insecurities.]  General Interventions   General Interventions Discussed/Reviewed General Interventions Discussed, Labs, Vaccines, Doctor Visits, Referral to Nurse, Communication with, Level of Care, [facility], Health Screening, Annual Foot Exam, Lipid Profile, General Interventions Reviewed, Annual Eye Exam, Durable Medical Equipment (DME)  [Encouraged Routine Engagement with Care Team Members & Providers.]  Labs Hgb A1c every 3 months, Kidney Function  [Encouraged Routine Lab Work.]  Vaccines COVID-19, Flu, Pneumonia, RSV, Shingles, Tetanus/Pertussis/Diphtheria  [Encouraged Routine Vaccinations.]  Doctor Visits Discussed/Reviewed Doctor Visits Discussed, Specialist, Doctor Visits Reviewed, Annual Wellness Visits, PCP  [Encouraged Routine Engagement with Care Team Members & Providers.]  Health Screening Bone Density, Colonoscopy, Prostate  [Encouraged Routine Health Screenings.]  Durable Medical Equipment (DME) BP Cuff, Other  [Cane, Scales, Prescription Eyeglasses.]  PCP/Specialist Visits Compliance with follow-up visit  [Encouraged  Routine Engagement with Care Team Members & Providers.]  Communication with PCP/Specialists, RN, Pharmacists, Social Work  [employer] Routine Engagement with Care Team Members & Providers.]  Level of Care Adult Daycare, Air traffic controller, Assisted Living, Skilled Nursing Facility  [Confirmed Disinterest in Enrollment in Adult Day Care Program. Confirmed Disinterest in Receiving Assistance Pursuing Higher Level of Care Placement Options (I.e Assisted Living Versus Skilled Nursing Facility).]  Applications Medicaid, Personal Care Services  [Confirmed Interest in Applying for Medicaid & Personal Care Services, Mailing Applications & Offering to Assist with Completion & Submission.]  Exercise Interventions   Exercise Discussed/Reviewed Exercise Discussed, Assistive device use and maintanence, Exercise Reviewed, Physical Activity, Weight Managment  [Encouraged Daily Exercise Regimen, as Tolerated.]  Physical Activity Discussed/Reviewed Physical Activity Discussed, Home Exercise Program (HEP), PREP, Physical Activity Reviewed, Gym, Types of exercise  [Encouraged Increased Level of Activity & Exercise, Inside & Outside the Home.]  Weight Management Weight loss  [Encouraged Healthy Weight Loss Regimen.]  Education Interventions   Education Provided Provided Therapist, sports, Provided Web-based Education, Provided Education  [employer] Material & Encouraged Independent Review.]  Provided Verbal Education On Nutrition, Mental Health/Coping with Illness, Foot Care, Eye Care, Applications, Labs, Blood Sugar Monitoring, Exercise, Medication, When to see the doctor, [facility], Development worker, community  [employer] Implementation of Educational Material Provided & Reviewed.]  Labs Reviewed Hgb A1c  [Reviewed & Encouraged Monitoring.]  Ship broker, Personal Care Services  [employer] Interest in Applying for [employer] & Personal Care Services, Teaching laboratory technician & Offering to Assist with Completion &  Submission.]  Mental Health Interventions   Mental Health Discussed/Reviewed Mental Health Discussed, Mental Health Reviewed, Coping Strategies, Crisis, Anxiety, Depression, Grief and Loss, Substance Abuse, Suicide, Other  [Assessed Mental Health & Cognitive Status.]  Nutrition Interventions  Nutrition Discussed/Reviewed Nutrition Discussed, Adding fruits and vegetables, Increasing proteins, Nutrition Reviewed, Carbohydrate meal planning, Decreasing sugar intake, Decreasing salt, Portion sizes, Fluid intake, Decreasing fats  [Encouraged Heart-Healthy, Low Sodium, Reduced Fat Diet.]  Pharmacy Interventions   Pharmacy Dicussed/Reviewed Pharmacy Topics Discussed, Medications and their functions, Affording Medications, Medication Adherence, Pharmacy Topics Reviewed  [Confirmed Ability to Afford Prescription Medications.]  Medication Adherence --  [Confirmed Compliance with Prescription Medications.]  Safety Interventions   Safety Discussed/Reviewed Safety Discussed, Safety Reviewed, Fall Risk, Home Safety  [Encouraged Routine Use of Assistive Devices & Durable Medical Equipment.]  Home Safety Assistive Devices, Need for home safety assessment, Refer for community resources  [Encouraged Consideration of Home Health Physical Therapy Services & Home Safety Evaluation.]  Advanced Directive Interventions   Advanced Directives Discussed/Reviewed Advanced Directives Discussed, Advanced Directives Reviewed  [Confirmed Initiation of Advanced Directives (Living Will & Healthcare Power of Attorney Documents), Requesting Copies to Scan into Electronic Medical Record in Epic.]       Assessed Social Determinant of Health Barriers. Discussed Plans for Ongoing Care Management Follow Up. Provided Careers information officer Information for Care Management Team Members. Screened for Signs & Symptoms of Depression, Related to Chronic Disease State.  PHQ2 & PHQ9 Depression Screen Completed & Results Reviewed.  Suicidal Ideation &  Homicidal Ideation Assessed - None Present.   Domestic Violence Assessed - None Present. Access to Weapons Assessed - None Present.   Active Listening & Reflection Utilized.  Verbalization of Feelings Encouraged.  Emotional Support Provided. Crisis Support Information, Agencies, Services, & Resources Discussed. Problem Solving Interventions Identified. Task-Centered Solutions Implemented.   Solution-Focused Strategies Developed. Acceptance & Commitment Therapy Introduced. Brief Cognitive Behavioral Therapy Initiated. Client-Centered Therapy Enacted. Reviewed Prescription Medications & Discussed Importance of Compliance. Quality of Sleep Assessed & Sleep Hygiene Techniques Promoted. Discussed Higher Level of Care Placement Options (I.e. Memory Care, Assisted Living, Extended Care, Skilled Nursing, Et.) & Encouraged Consideration. Verified No In-Home Care Services, [employer], Warden/ranger, Etc., Covered Under Firefighter through [employer].   Reviewed Materials engineer through [facility] & Encouraged Consideration of Applying for Medicaid, Offering to Mail Application, & Assist with Completion & Submission, to [facility] of Social Services [phone]). Verified No Long-Term Care Insurance Benefits, Secondary Insurance Policies, Plans, Coverage, Etc.  Confirm Patient, Nor Previous Wife Were Veterans, Making Him Ineligible to Apply for Aid & Attendance Benefits, through [employer]. Encouraged Review of The Following List of [employer], [employer], Resources, Occupational psychologist, & Be Prepared to Discuss During Our Next Follow-Up [employer], Emailed on 05/15/2023: ~ Adult Day Care Programs  ~ In-Home Care & Respite Agencies ~ Home Health Care Agencies ~ Respite Care Agencies & Facilities ~ Home Care Services through Aging, Disability, & Transit Services of      [place] ~ [place]  Application ~ Instructions for How to Apply for [employer] ~ Making Medicaid Accessible through [employer] Brochure ~ 2024 Medicaid Income Reserve Cheat Sheet ~ Merchant navy officer ~ Theatre manager Providers ~ Family Caregivers ~ Corporate investment banker Programs in [place] Encouraged Routine Engagement with Danford Bad, Licensed Clinical Social Worker with [employer] [phone]), if You Have Questions, Need Assistance, or If Additional Social Work Needs Are Identified Between Now & Our Next Follow-Up Outreach Call, Scheduled on 05/22/2023 at 1:30 PM.         SDOH assessments and interventions completed:  Yes.  SDOH Interventions Today  Flowsheet Row Most Recent Value  SDOH Interventions   Food Insecurity Interventions Intervention Not Indicated, Community Resources Provided  Housing Interventions Intervention Not Indicated  Transportation Interventions Intervention Not Indicated, Programmer, applications Provided, Patient Resources (Friends/Family)  Utilities Interventions Intervention Not Indicated  Alcohol Usage Interventions Intervention Not Indicated (Score <7)  Depression Interventions/Treatment  Medication, Counseling, Radiation protection practitioner Strain Interventions Intervention Not Indicated, Community Resources Provided  Physical Activity Interventions Patient Declined  Stress Interventions Intervention Not Indicated, Programmer, applications Provided, [employer], Provide Counseling  Social Connections Interventions Intervention Not Indicated, Community Resources Provided  Health Literacy Interventions Intervention Not Indicated     Care Coordination Interventions:  Yes, provided.   Follow up plan: Follow up call scheduled for 05/22/2023 at 1:30 pm.  Encounter Outcome:  Patient Visit Completed.   Danford Bad, BSW, MSW, LCSW [facility], [facility] Clinical Social Worker II Direct Dial: [phone]  Fax: [phone] Website: [email or web address]

## 2023-05-15 NOTE — Patient Instructions (Signed)
Visit Information  Thank you for taking time to visit with me today. Please don't hesitate to contact me if I can be of assistance to you.   Following are the goals we discussed today:   Goals Addressed               This Visit's Progress     Find Help in My Community. (pt-stated)   On track     Care Coordination Interventions:  Interventions Today    Flowsheet Row Most Recent Value  Chronic Disease   Chronic disease during today's visit Other, Hypertension (HTN)  [Hypothyroidism, Prostate Cancer, Myalgia Due to Statin, Insomnia, Moderate Major Depression, Single Episode, Chronic Bilateral Low Back Pain without Sciatica, Limited Family Support, Unsteady Balance/Gait, Hyperlipidemia, Financial Insecurities.]  General Interventions   General Interventions Discussed/Reviewed General Interventions Discussed, Labs, Vaccines, Doctor Visits, Referral to Nurse, Communication with, Level of Care, [facility], Health Screening, Annual Foot Exam, Lipid Profile, General Interventions Reviewed, Annual Eye Exam, Durable Medical Equipment (DME)  [Encouraged Routine Engagement with Care Team Members & Providers.]  Labs Hgb A1c every 3 months, Kidney Function  [Encouraged Routine Lab Work.]  Vaccines COVID-19, Flu, Pneumonia, RSV, Shingles, Tetanus/Pertussis/Diphtheria  [Encouraged Routine Vaccinations.]  Doctor Visits Discussed/Reviewed Doctor Visits Discussed, Specialist, Doctor Visits Reviewed, Annual Wellness Visits, PCP  [Encouraged Routine Engagement with Care Team Members & Providers.]  Health Screening Bone Density, Colonoscopy, Prostate  [Encouraged Routine Health Screenings.]  Durable Medical Equipment (DME) BP Cuff, Other  [Cane, Scales, Prescription Eyeglasses.]  PCP/Specialist Visits Compliance with follow-up visit  [Encouraged Routine Engagement with Care Team Members & Providers.]  Communication with PCP/Specialists, RN, Pharmacists, Social Work  [employer] Routine Engagement with  Care Team Members & Providers.]  Level of Care Adult Daycare, Air traffic controller, Assisted Living, Skilled Nursing Facility  [Confirmed Disinterest in Enrollment in Adult Day Care Program. Confirmed Disinterest in Receiving Assistance Pursuing Higher Level of Care Placement Options (I.e Assisted Living Versus Skilled Nursing Facility).]  Applications Medicaid, Personal Care Services  [Confirmed Interest in Applying for Medicaid & Personal Care Services, Mailing Applications & Offering to Assist with Completion & Submission.]  Exercise Interventions   Exercise Discussed/Reviewed Exercise Discussed, Assistive device use and maintanence, Exercise Reviewed, Physical Activity, Weight Managment  [Encouraged Daily Exercise Regimen, as Tolerated.]  Physical Activity Discussed/Reviewed Physical Activity Discussed, Home Exercise Program (HEP), PREP, Physical Activity Reviewed, Gym, Types of exercise  [Encouraged Increased Level of Activity & Exercise, Inside & Outside the Home.]  Weight Management Weight loss  [Encouraged Healthy Weight Loss Regimen.]  Education Interventions   Education Provided Provided Therapist, sports, Provided Web-based Education, Provided Education  [employer] Material & Encouraged Independent Review.]  Provided Verbal Education On Nutrition, Mental Health/Coping with Illness, Foot Care, Eye Care, Applications, Labs, Blood Sugar Monitoring, Exercise, Medication, When to see the doctor, [facility], Development worker, community  [employer] Implementation of Educational Material Provided & Reviewed.]  Labs Reviewed Hgb A1c  [Reviewed & Encouraged Monitoring.]  Ship broker, Personal Care Services  [employer] Interest in Applying for [employer] & Personal Care Services, Teaching laboratory technician & Offering to Assist with Completion & Submission.]  Mental Health Interventions   Mental Health Discussed/Reviewed Mental Health Discussed, Mental Health Reviewed, Coping Strategies, Crisis,  Anxiety, Depression, Grief and Loss, Substance Abuse, Suicide, Other  [Assessed Mental Health & Cognitive Status.]  Nutrition Interventions   Nutrition Discussed/Reviewed Nutrition Discussed, Adding fruits and vegetables, Increasing proteins, Nutrition Reviewed, Carbohydrate meal planning, Decreasing sugar intake, Decreasing salt, Portion sizes, Fluid intake, Decreasing fats  [Encouraged  Heart-Healthy, Low Sodium, Reduced Fat Diet.]  Pharmacy Interventions   Pharmacy Dicussed/Reviewed Pharmacy Topics Discussed, Medications and their functions, Affording Medications, Medication Adherence, Pharmacy Topics Reviewed  [Confirmed Ability to Afford Prescription Medications.]  Medication Adherence --  [Confirmed Compliance with Prescription Medications.]  Safety Interventions   Safety Discussed/Reviewed Safety Discussed, Safety Reviewed, Fall Risk, Home Safety  [Encouraged Routine Use of Assistive Devices & Durable Medical Equipment.]  Home Safety Assistive Devices, Need for home safety assessment, Refer for community resources  [Encouraged Consideration of Home Health Physical Therapy Services & Home Safety Evaluation.]  Advanced Directive Interventions   Advanced Directives Discussed/Reviewed Advanced Directives Discussed, Advanced Directives Reviewed  [Confirmed Initiation of Advanced Directives (Living Will & Healthcare Power of Attorney Documents), Requesting Copies to Scan into Electronic Medical Record in Epic.]       Assessed Social Determinant of Health Barriers. Discussed Plans for Ongoing Care Management Follow Up. Provided Careers information officer Information for Care Management Team Members. Screened for Signs & Symptoms of Depression, Related to Chronic Disease State.  PHQ2 & PHQ9 Depression Screen Completed & Results Reviewed.  Suicidal Ideation & Homicidal Ideation Assessed - None Present.   Domestic Violence Assessed - None Present. Access to Weapons Assessed - None Present.   Active Listening  & Reflection Utilized.  Verbalization of Feelings Encouraged.  Emotional Support Provided. Crisis Support Information, Agencies, Services, & Resources Discussed. Problem Solving Interventions Identified. Task-Centered Solutions Implemented.   Solution-Focused Strategies Developed. Acceptance & Commitment Therapy Introduced. Brief Cognitive Behavioral Therapy Initiated. Client-Centered Therapy Enacted. Reviewed Prescription Medications & Discussed Importance of Compliance. Quality of Sleep Assessed & Sleep Hygiene Techniques Promoted. Discussed Higher Level of Care Placement Options (I.e. Memory Care, Assisted Living, Extended Care, Skilled Nursing, Et.) & Encouraged Consideration. Verified No In-Home Care Services, [employer], Warden/ranger, Etc., Covered Under Firefighter through [employer].   Reviewed Materials engineer through [facility] & Encouraged Consideration of Applying for Medicaid, Offering to Mail Application, & Assist with Completion & Submission, to [facility] of Social Services [phone]). Verified No Long-Term Care Insurance Benefits, Secondary Insurance Policies, Plans, Coverage, Etc.  Confirm Patient, Nor Previous Wife Were Veterans, Making Him Ineligible to Apply for Aid & Attendance Benefits, through [employer]. Encouraged Review of The Following List of [employer], [employer], Resources, Occupational psychologist, & Be Prepared to Discuss During Our Next Follow-Up [employer], Emailed on 05/15/2023: ~ Adult Day Care Programs  ~ In-Home Care & Respite Agencies ~ Home Health Care Agencies ~ Respite Care Agencies & Facilities ~ Home Care Services through Aging, Disability, & Transit Services of      [place] ~ [place] Application ~ Instructions for How to Apply for [employer] ~ Making Medicaid Accessible through [employer] Brochure ~ 2024  Medicaid Income Reserve Cheat Sheet ~ Merchant navy officer ~ Theatre manager Providers ~ Family Caregivers ~ Corporate investment banker Programs in [place] Encouraged Routine Engagement with Danford Bad, Licensed Clinical Social Worker with [employer] [phone]), if You Have Questions, Need Assistance, or If Additional Social Work Needs Are Identified Between Now & Our Next Follow-Up Outreach Call, Scheduled on 05/22/2023 at 1:30 PM.       Our next appointment is by telephone on 05/22/2023 at 1:30 pm.  Please call the care guide team at [phone] if you need to cancel or reschedule your appointment.   If you are experiencing a Mental Health or Behavioral Health Crisis  or need someone to talk to, please call the Suicide and Crisis Lifeline: 988 call the [place] National Suicide Prevention Lifeline: 9312230347 or TTY: 430-605-2885 TTY (732) 870-3208) to talk to a trained counselor call 1-800-273-TALK (toll free, 24 hour hotline) go to [facility] [address], [place] [phone]) call the [facility] Crisis Line: [phone] call 911  Patient verbalizes understanding of instructions and care plan provided today and agrees to view in MyChart. Active MyChart status and patient understanding of how to access instructions and care plan via MyChart confirmed with patient.     Telephone follow up appointment with care management team member scheduled for:  05/22/2023 at 1:30 pm.   Danford Bad, BSW, MSW, LCSW [place]  [facility], [facility] Clinical Social Worker II Direct Dial: [phone]  Fax: [phone] Website: [email or web address]

## 2023-05-19 ENCOUNTER — Ambulatory Visit: Payer: Self-pay | Admitting: *Deleted

## 2023-05-19 ENCOUNTER — Encounter: Payer: Self-pay | Admitting: Oncology

## 2023-05-19 NOTE — Patient Instructions (Signed)
Visit Information  Thank you for taking time to visit with me today. Please don't hesitate to contact me if I can be of assistance to you.   Following are the goals we discussed today:   Goals Addressed               This Visit's Progress     Find Help in My Community. (pt-stated)   On track     Care Coordination Interventions:  Interventions Today    Flowsheet Row Most Recent Value  Chronic Disease   Chronic disease during today's visit Other, Hypertension (HTN)  [Hypothyroidism, Prostate Cancer, Myalgia Due to Statin, Insomnia, Moderate Major Depression, Single Episode, Chronic Bilateral Low Back Pain without Sciatica, Limited Family Support, Unsteady Balance/Gait, Hyperlipidemia, Financial Insecurities.]  General Interventions   General Interventions Discussed/Reviewed General Interventions Discussed, Labs, Vaccines, Doctor Visits, Referral to Nurse, Communication with, Level of Care, [facility], Health Screening, Annual Foot Exam, Lipid Profile, General Interventions Reviewed, Annual Eye Exam, Durable Medical Equipment (DME)  [Encouraged Routine Engagement with Care Team Members & Providers.]  Labs Hgb A1c every 3 months, Kidney Function  [Encouraged Routine Lab Work.]  Vaccines COVID-19, Flu, Pneumonia, RSV, Shingles, Tetanus/Pertussis/Diphtheria  [Encouraged Routine Vaccinations.]  Doctor Visits Discussed/Reviewed Doctor Visits Discussed, Specialist, Doctor Visits Reviewed, Annual Wellness Visits, PCP  [Encouraged Routine Engagement with Care Team Members & Providers.]  Health Screening Bone Density, Colonoscopy, Prostate  [Encouraged Routine Health Screenings.]  Durable Medical Equipment (DME) BP Cuff, Other  [Cane, Scales, Prescription Eyeglasses.]  PCP/Specialist Visits Compliance with follow-up visit  [Encouraged Routine Engagement with Care Team Members & Providers.]  Communication with PCP/Specialists, RN, Pharmacists, Social Work  [employer] Routine Engagement with  Care Team Members & Providers.]  Level of Care Adult Daycare, Air traffic controller, Assisted Living, Skilled Nursing Facility  [Confirmed Disinterest in Enrollment in Adult Day Care Program. Confirmed Disinterest in Receiving Assistance Pursuing Higher Level of Care Placement Options (I.e Assisted Living Versus Skilled Nursing Facility).]  Applications Medicaid, Personal Care Services  [Confirmed Interest in Applying for Medicaid & Personal Care Services, Mailing Applications & Offering to Assist with Completion & Submission.]  Exercise Interventions   Exercise Discussed/Reviewed Exercise Discussed, Assistive device use and maintanence, Exercise Reviewed, Physical Activity, Weight Managment  [Encouraged Daily Exercise Regimen, as Tolerated.]  Physical Activity Discussed/Reviewed Physical Activity Discussed, Home Exercise Program (HEP), PREP, Physical Activity Reviewed, Gym, Types of exercise  [Encouraged Increased Level of Activity & Exercise, Inside & Outside the Home.]  Weight Management Weight loss  [Encouraged Healthy Weight Loss Regimen.]  Education Interventions   Education Provided Provided Therapist, sports, Provided Web-based Education, Provided Education  [employer] Material & Encouraged Independent Review.]  Provided Verbal Education On Nutrition, Mental Health/Coping with Illness, Foot Care, Eye Care, Applications, Labs, Blood Sugar Monitoring, Exercise, Medication, When to see the doctor, [facility], Development worker, community  [employer] Implementation of Educational Material Provided & Reviewed.]  Labs Reviewed Hgb A1c  [Reviewed & Encouraged Monitoring.]  Ship broker, Personal Care Services  [employer] Interest in Applying for [employer] & Personal Care Services, Teaching laboratory technician & Offering to Assist with Completion & Submission.]  Mental Health Interventions   Mental Health Discussed/Reviewed Mental Health Discussed, Mental Health Reviewed, Coping Strategies, Crisis,  Anxiety, Depression, Grief and Loss, Substance Abuse, Suicide, Other  [Assessed Mental Health & Cognitive Status.]  Nutrition Interventions   Nutrition Discussed/Reviewed Nutrition Discussed, Adding fruits and vegetables, Increasing proteins, Nutrition Reviewed, Carbohydrate meal planning, Decreasing sugar intake, Decreasing salt, Portion sizes, Fluid intake, Decreasing fats  [Encouraged  Heart-Healthy, Low Sodium, Reduced Fat Diet.]  Pharmacy Interventions   Pharmacy Dicussed/Reviewed Pharmacy Topics Discussed, Medications and their functions, Affording Medications, Medication Adherence, Pharmacy Topics Reviewed  [Confirmed Ability to Afford Prescription Medications.]  Medication Adherence --  [Confirmed Compliance with Prescription Medications.]  Safety Interventions   Safety Discussed/Reviewed Safety Discussed, Safety Reviewed, Fall Risk, Home Safety  [Encouraged Routine Use of Assistive Devices & Durable Medical Equipment.]  Home Safety Assistive Devices, Need for home safety assessment, Refer for community resources  [Encouraged Consideration of Home Health Physical Therapy Services & Home Safety Evaluation.]  Advanced Directive Interventions   Advanced Directives Discussed/Reviewed Advanced Directives Discussed, Advanced Directives Reviewed  [Confirmed Initiation of Advanced Directives (Living Will & Healthcare Power of Attorney Documents), Requesting Copies to Scan into Electronic Medical Record in Epic.]       Active Listening & Reflection Utilized.  Verbalization of Feelings Encouraged.  Emotional Support Provided. Problem Solving Interventions Employed. Task-Centered Solutions Activated.   Solution-Focused Strategies Implemented. Acceptance & Commitment Therapy Initiated. Cognitive Behavioral Therapy Performed. Client-Centered Therapy Conducted. CSW Collaboration with Dr. Lilyan Punt, Primary Care Provider with [facility] [phone]# [phone]), Via Secure Chat  Message & Routed Note in Epic, to Request Refill of Prescription for Insulin, Per Your Request. CSW Collaboration with Gwenevere Ghazi, Scheduling Care Guide with [facility], Population Health [phone]), Via Outreach Call & Secure Chat Message in [place], to Confirm Emergent Referral Placed to Pharmacist with [facility] [phone]# [phone]). Confirmed Disinterest in Pursuing Higher Level of Care Placement Options (I.e. Memory Care, Assisted Living, Extended Care, Skilled Nursing, Et.). Confirmed Receipt & Thoroughly Reviewed the Following List of [employer], Nurse, adult, Resources, Occupational psychologist, to [employer] & Entertain Questions, Technical sales engineer with [employer], Landscape architect Completion, & Submission:  ~ Adult Day Care Programs  ~ In-Home Care & Respite Agencies ~ Home Health Care Agencies ~ Respite Care Agencies & Facilities ~ Home Care Services through Aging, Disability, & Transit Services of      [place] ~ [place] Application ~ Instructions for How to Apply for [employer] ~ Making Medicaid Accessible through [employer] Brochure ~ 2024 Medicaid Income Reserve Cheat Sheet ~ Merchant navy officer ~ Theatre manager Providers ~ Family Caregivers ~ Corporate investment banker Programs in [place] Encouraged Routine Engagement with Danford Bad, Licensed Clinical Social Worker with [employer], Population Health [phone]), if You Have Questions, Need Assistance, or If Additional Social Work Needs Are Identified Between Now & Our Next Follow-Up Outreach Call, Scheduled on 05/22/2023 at 1:30 PM.       Our next appointment is by telephone on 05/22/2023 at 1:30 pm.  Please call the care guide team at [phone] if you need to cancel or reschedule your appointment.   If you are experiencing a Mental Health or Behavioral Health  Crisis or need someone to talk to, please call the Suicide and Crisis Lifeline: 988 call the [place] National Suicide Prevention Lifeline: (229) 855-2056 or TTY: 406-320-7751 TTY 985-315-8322) to talk to a trained counselor call 1-800-273-TALK (toll free, 24 hour hotline) go to [facility] [address] [place] [phone]) call the [facility] Crisis Line: 838-654-8365 call 911  Patient verbalizes understanding of instructions and care plan provided today and agrees to view in MyChart. Active MyChart status and patient understanding of how to access instructions and care plan via MyChart confirmed with patient.     Telephone  follow up appointment with care management team member scheduled for:  05/22/2023 at 1:30 pm.  Danford Bad, BSW, MSW, LCSW [place]  [facility], [facility] Clinical Social Worker II Direct Dial: [phone]  Fax: [phone] Website: [email or web address]

## 2023-05-19 NOTE — Patient Outreach (Signed)
Care Coordination   Follow Up Visit Note   05/19/2023  Name: MERCED [name] [name] DOB: [birth year]  Thomas [name] is a 78 y.o. year old male who sees [name], [name], MD for primary care. I spoke with [name] by phone today.  What matters to the patients health and wellness today?  Find Help in My Community.     Goals Addressed               This Visit's Progress     Find Help in My Community. (pt-stated)   On track     Care Coordination Interventions:  Interventions Today    Flowsheet Row Most Recent Value  Chronic Disease   Chronic disease during today's visit Other, Hypertension (HTN)  [Hypothyroidism, Prostate Cancer, Myalgia Due to Statin, Insomnia, Moderate Major Depression, Single Episode, Chronic Bilateral Low Back Pain without Sciatica, Limited Family Support, Unsteady Balance/Gait, Hyperlipidemia, Financial Insecurities.]  General Interventions   General Interventions Discussed/Reviewed General Interventions Discussed, Labs, Vaccines, Doctor Visits, Referral to Nurse, Communication with, Level of Care, [facility], Health Screening, Annual Foot Exam, Lipid Profile, General Interventions Reviewed, Annual Eye Exam, Durable Medical Equipment (DME)  [Encouraged Routine Engagement with Care Team Members & Providers.]  Labs Hgb A1c every 3 months, Kidney Function  [Encouraged Routine Lab Work.]  Vaccines COVID-19, Flu, Pneumonia, RSV, Shingles, Tetanus/Pertussis/Diphtheria  [Encouraged Routine Vaccinations.]  Doctor Visits Discussed/Reviewed Doctor Visits Discussed, Specialist, Doctor Visits Reviewed, Annual Wellness Visits, PCP  [Encouraged Routine Engagement with Care Team Members & Providers.]  Health Screening Bone Density, Colonoscopy, Prostate  [Encouraged Routine Health Screenings.]  Durable Medical Equipment (DME) BP Cuff, Other  [Cane, Scales, Prescription Eyeglasses.]  PCP/Specialist Visits Compliance with follow-up visit   [Encouraged Routine Engagement with Care Team Members & Providers.]  Communication with PCP/Specialists, RN, Pharmacists, Social Work  [employer] Routine Engagement with Care Team Members & Providers.]  Level of Care Adult Daycare, Air traffic controller, Assisted Living, Skilled Nursing Facility  [Confirmed Disinterest in Enrollment in Adult Day Care Program. Confirmed Disinterest in Receiving Assistance Pursuing Higher Level of Care Placement Options (I.e Assisted Living Versus Skilled Nursing Facility).]  Applications Medicaid, Personal Care Services  [Confirmed Interest in Applying for Medicaid & Personal Care Services, Mailing Applications & Offering to Assist with Completion & Submission.]  Exercise Interventions   Exercise Discussed/Reviewed Exercise Discussed, Assistive device use and maintanence, Exercise Reviewed, Physical Activity, Weight Managment  [Encouraged Daily Exercise Regimen, as Tolerated.]  Physical Activity Discussed/Reviewed Physical Activity Discussed, Home Exercise Program (HEP), PREP, Physical Activity Reviewed, Gym, Types of exercise  [Encouraged Increased Level of Activity & Exercise, Inside & Outside the Home.]  Weight Management Weight loss  [Encouraged Healthy Weight Loss Regimen.]  Education Interventions   Education Provided Provided Therapist, sports, Provided Web-based Education, Provided Education  [employer] Material & Encouraged Independent Review.]  Provided Verbal Education On Nutrition, Mental Health/Coping with Illness, Foot Care, Eye Care, Applications, Labs, Blood Sugar Monitoring, Exercise, Medication, When to see the doctor, [facility], Development worker, community  [employer] Implementation of Educational Material Provided & Reviewed.]  Labs Reviewed Hgb A1c  [Reviewed & Encouraged Monitoring.]  Ship broker, Personal Care Services  [employer] Interest in Applying for [employer] & Personal Care Services, Teaching laboratory technician & Offering to Assist with  Completion & Submission.]  Mental Health Interventions   Mental Health Discussed/Reviewed Mental Health Discussed, Mental Health Reviewed, Coping Strategies, Crisis, Anxiety, Depression, Grief and Loss, Substance Abuse, Suicide, Other  [Assessed Mental Health & Cognitive Status.]  Nutrition  Interventions   Nutrition Discussed/Reviewed Nutrition Discussed, Adding fruits and vegetables, Increasing proteins, Nutrition Reviewed, Carbohydrate meal planning, Decreasing sugar intake, Decreasing salt, Portion sizes, Fluid intake, Decreasing fats  [Encouraged Heart-Healthy, Low Sodium, Reduced Fat Diet.]  Pharmacy Interventions   Pharmacy Dicussed/Reviewed Pharmacy Topics Discussed, Medications and their functions, Affording Medications, Medication Adherence, Pharmacy Topics Reviewed  [Confirmed Ability to Afford Prescription Medications.]  Medication Adherence --  [Confirmed Compliance with Prescription Medications.]  Safety Interventions   Safety Discussed/Reviewed Safety Discussed, Safety Reviewed, Fall Risk, Home Safety  [Encouraged Routine Use of Assistive Devices & Durable Medical Equipment.]  Home Safety Assistive Devices, Need for home safety assessment, Refer for community resources  [Encouraged Consideration of Home Health Physical Therapy Services & Home Safety Evaluation.]  Advanced Directive Interventions   Advanced Directives Discussed/Reviewed Advanced Directives Discussed, Advanced Directives Reviewed  [Confirmed Initiation of Advanced Directives (Living Will & Healthcare Power of Attorney Documents), Requesting Copies to Scan into Electronic Medical Record in Epic.]       Active Listening & Reflection Utilized.  Verbalization of Feelings Encouraged.  Emotional Support Provided. Problem Solving Interventions Employed. Task-Centered Solutions Activated.   Solution-Focused Strategies Implemented. Acceptance & Commitment Therapy Initiated. Cognitive Behavioral Therapy  Performed. Client-Centered Therapy Conducted. CSW Collaboration with Dr. Lilyan Punt, Primary Care Provider with [facility] [phone]# [phone]), Via Secure Chat Message & Routed Note in Epic, to Request Refill of Prescription for Insulin, Per Your Request. CSW Collaboration with Gwenevere Ghazi, Scheduling Care Guide with [facility], Population Health [phone]), Via Outreach Call & Secure Chat Message in [place], to Confirm Emergent Referral Placed to Pharmacist with [facility] [phone]# [phone]). Confirmed Disinterest in Pursuing Higher Level of Care Placement Options (I.e. Memory Care, Assisted Living, Extended Care, Skilled Nursing, Et.). Confirmed Receipt & Thoroughly Reviewed the Following List of [employer], Nurse, adult, Resources, Occupational psychologist, to [employer] & Entertain Questions, Technical sales engineer with [employer], Landscape architect Completion, & Submission:  ~ Adult Day Care Programs  ~ In-Home Care & Respite Agencies ~ Home Health Care Agencies ~ Respite Care Agencies & Facilities ~ Home Care Services through Aging, Disability, & Transit Services of      [place] ~ [place] Application ~ Instructions for How to Apply for [employer] ~ Making [employer] Accessible through [employer] Brochure ~ 2024 Medicaid Income Reserve Cheat Sheet ~ Merchant navy officer ~ Theatre manager Providers ~ Family Caregivers ~ Corporate investment banker Programs in [place] Encouraged Routine Engagement with Danford Bad, Licensed Clinical Social Worker with [facility], Population Health [phone]), if You Have Questions, Need Assistance, or If Additional Social Work Needs Are Identified Between Now & Our Next Follow-Up Outreach Call, Scheduled on 05/22/2023 at 1:30 PM.       SDOH assessments and interventions completed:   Yes.  Care Coordination Interventions:  Yes, provided.   Follow up plan: Follow up call scheduled for 05/22/2023 at 1:30 pm.  Encounter Outcome:  Patient Visit Completed.   Danford Bad, BSW, MSW, LCSW [facility], [facility] Clinical Social Worker II Direct Dial: [phone]  Fax: [phone] Website: [email or web address]

## 2023-05-20 ENCOUNTER — Encounter: Payer: Self-pay | Admitting: Oncology

## 2023-05-22 ENCOUNTER — Ambulatory Visit: Payer: Self-pay | Admitting: *Deleted

## 2023-05-22 NOTE — Patient Outreach (Signed)
Care Coordination   Follow Up Visit Note   05/22/2023  Name: Thomas [name] MRN: [id number] DOB: 06/26/1945  Thomas [name] is a 78 y.o. year old male who sees [name], [name], MD for primary care. I spoke with [name] by phone today.  What matters to the patients health and wellness today?  Find Help in My Community.   Goals Addressed               This Visit's Progress     Find Help in My Community. (pt-stated)   On track     Care Coordination Interventions:  Interventions Today    Flowsheet Row Most Recent Value  Chronic Disease   Chronic disease during today's visit Other, Hypertension (HTN)  [Hypothyroidism, Prostate Cancer, Myalgia Due to Statin, Insomnia, Moderate Major Depression, Single Episode, Chronic Bilateral Low Back Pain without Sciatica, Limited Family Support, Unsteady Balance/Gait, Hyperlipidemia, Financial Insecurities.]  General Interventions   General Interventions Discussed/Reviewed General Interventions Discussed, Labs, Vaccines, Doctor Visits, Referral to Nurse, Communication with, Level of Care, [facility], Health Screening, Annual Foot Exam, Lipid Profile, General Interventions Reviewed, Annual Eye Exam, Durable Medical Equipment (DME)  [Encouraged Routine Engagement with Care Team Members & Providers.]  Labs Hgb A1c every 3 months, Kidney Function  [Encouraged Routine Lab Work.]  Vaccines COVID-19, Flu, Pneumonia, RSV, Shingles, Tetanus/Pertussis/Diphtheria  [Encouraged Routine Vaccinations.]  Doctor Visits Discussed/Reviewed Doctor Visits Discussed, Specialist, Doctor Visits Reviewed, Annual Wellness Visits, PCP  [Encouraged Routine Engagement with Care Team Members & Providers.]  Health Screening Bone Density, Colonoscopy, Prostate  [Encouraged Routine Health Screenings.]  Durable Medical Equipment (DME) BP Cuff, Other  [Cane, Scales, Prescription Eyeglasses.]  PCP/Specialist Visits Compliance with follow-up visit  [Encouraged  Routine Engagement with Care Team Members & Providers.]  Communication with PCP/Specialists, RN, Pharmacists, Social Work  [employer] Routine Engagement with Care Team Members & Providers.]  Level of Care Adult Daycare, Air traffic controller, Assisted Living, Skilled Nursing Facility  [Confirmed Disinterest in Enrollment in Adult Day Care Program. Confirmed Disinterest in Receiving Assistance Pursuing Higher Level of Care Placement Options (I.e Assisted Living Versus Skilled Nursing Facility).]  Applications Medicaid, Personal Care Services  [Confirmed Interest in Applying for Medicaid & Personal Care Services, Mailing Applications & Offering to Assist with Completion & Submission.]  Exercise Interventions   Exercise Discussed/Reviewed Exercise Discussed, Assistive device use and maintanence, Exercise Reviewed, Physical Activity, Weight Managment  [Encouraged Daily Exercise Regimen, as Tolerated.]  Physical Activity Discussed/Reviewed Physical Activity Discussed, Home Exercise Program (HEP), PREP, Physical Activity Reviewed, Gym, Types of exercise  [Encouraged Increased Level of Activity & Exercise, Inside & Outside the Home.]  Weight Management Weight loss  [Encouraged Healthy Weight Loss Regimen.]  Education Interventions   Education Provided Provided Therapist, sports, Provided Web-based Education, Provided Education  [employer] Material & Encouraged Independent Review.]  Provided Verbal Education On Nutrition, Mental Health/Coping with Illness, Foot Care, Eye Care, Applications, Labs, Blood Sugar Monitoring, Exercise, Medication, When to see the doctor, [facility], Development worker, community  [employer] Implementation of Educational Material Provided & Reviewed.]  Labs Reviewed Hgb A1c  [Reviewed & Encouraged Monitoring.]  Ship broker, Personal Care Services  [employer] Interest in Applying for [employer] & Personal Care Services, Teaching laboratory technician & Offering to Assist with Completion &  Submission.]  Mental Health Interventions   Mental Health Discussed/Reviewed Mental Health Discussed, Mental Health Reviewed, Coping Strategies, Crisis, Anxiety, Depression, Grief and Loss, Substance Abuse, Suicide, Other  [Assessed Mental Health & Cognitive Status.]  Nutrition Interventions  Nutrition Discussed/Reviewed Nutrition Discussed, Adding fruits and vegetables, Increasing proteins, Nutrition Reviewed, Carbohydrate meal planning, Decreasing sugar intake, Decreasing salt, Portion sizes, Fluid intake, Decreasing fats  [Encouraged Heart-Healthy, Low Sodium, Reduced Fat Diet.]  Pharmacy Interventions   Pharmacy Dicussed/Reviewed Pharmacy Topics Discussed, Medications and their functions, Affording Medications, Medication Adherence, Pharmacy Topics Reviewed  [Confirmed Ability to Afford Prescription Medications.]  Medication Adherence --  [Confirmed Compliance with Prescription Medications.]  Safety Interventions   Safety Discussed/Reviewed Safety Discussed, Safety Reviewed, Fall Risk, Home Safety  [Encouraged Routine Use of Assistive Devices & Durable Medical Equipment.]  Home Safety Assistive Devices, Need for home safety assessment, Refer for community resources  [Encouraged Consideration of Home Health Physical Therapy Services & Home Safety Evaluation.]  Advanced Directive Interventions   Advanced Directives Discussed/Reviewed Advanced Directives Discussed, Advanced Directives Reviewed  [Confirmed Initiation of Advanced Directives (Living Will & Healthcare Power of Attorney Documents), Requesting Copies to Scan into Electronic Medical Record in Epic.]       Active Listening & Reflection Utilized.  Verbalization of Feelings Encouraged.  Emotional Support Provided. Problem Solving Interventions Employed. Solution-Focused Strategies Implemented. Acceptance & Commitment Therapy Performed. Cognitive Behavioral Therapy Initiated. Client-Centered Therapy Indicated. Encouraged Engagement  with [employer], Nurse, adult, Tourist information centre manager, & Completion of Applications of Interest, from List Provided:  ~ Adult Day Care Programs  ~ In-Home Care & Respite Agencies ~ Home Health Care Agencies ~ Respite Care Agencies & Facilities ~ Home Care Services through Aging, Disability, & Transit Services of      [place] ~ [place] Application ~ Instructions for How to [employer] for [employer] ~ Making [employer] Accessible through [employer] Brochure ~ 2024 Medicaid Income Reserve Cheat Sheet ~ Merchant navy officer ~ Theatre manager Providers ~ Family Caregivers ~ Corporate investment banker Programs in [place] Encouraged Routine Engagement with Danford Bad, Licensed Clinical Social Worker with [facility], Population Health [phone]), if You Have Questions, Need Assistance, or If Additional Social Work Needs Are Identified Between Now & Our Next Follow-Up Outreach Call, Scheduled on 06/09/2023 at 11:15 AM.       SDOH assessments and interventions completed:  Yes.  Care Coordination Interventions:  Yes, provided.   Follow up plan: Follow up call scheduled for 06/09/2023 at 11:15 am.  Encounter Outcome:  Patient Visit Completed.   Danford Bad, BSW, MSW, LCSW [facility], [facility] Clinical Social Worker II Direct Dial: [phone]  Fax: [phone] Website: [email or web address]

## 2023-05-22 NOTE — Patient Instructions (Signed)
Visit Information  Thank you for taking time to visit with me today. Please don't hesitate to contact me if I can be of assistance to you.   Following are the goals we discussed today:   Goals Addressed               This Visit's Progress     Find Help in My Community. (pt-stated)   On track     Care Coordination Interventions:  Interventions Today    Flowsheet Row Most Recent Value  Chronic Disease   Chronic disease during today's visit Other, Hypertension (HTN)  [Hypothyroidism, Prostate Cancer, Myalgia Due to Statin, Insomnia, Moderate Major Depression, Single Episode, Chronic Bilateral Low Back Pain without Sciatica, Limited Family Support, Unsteady Balance/Gait, Hyperlipidemia, Financial Insecurities.]  General Interventions   General Interventions Discussed/Reviewed General Interventions Discussed, Labs, Vaccines, Doctor Visits, Referral to Nurse, Communication with, Level of Care, [facility], Health Screening, Annual Foot Exam, Lipid Profile, General Interventions Reviewed, Annual Eye Exam, Durable Medical Equipment (DME)  [Encouraged Routine Engagement with Care Team Members & Providers.]  Labs Hgb A1c every 3 months, Kidney Function  [Encouraged Routine Lab Work.]  Vaccines COVID-19, Flu, Pneumonia, RSV, Shingles, Tetanus/Pertussis/Diphtheria  [Encouraged Routine Vaccinations.]  Doctor Visits Discussed/Reviewed Doctor Visits Discussed, Specialist, Doctor Visits Reviewed, Annual Wellness Visits, PCP  [Encouraged Routine Engagement with Care Team Members & Providers.]  Health Screening Bone Density, Colonoscopy, Prostate  [Encouraged Routine Health Screenings.]  Durable Medical Equipment (DME) BP Cuff, Other  [Cane, Scales, Prescription Eyeglasses.]  PCP/Specialist Visits Compliance with follow-up visit  [Encouraged Routine Engagement with Care Team Members & Providers.]  Communication with PCP/Specialists, RN, Pharmacists, Social Work  [employer] Routine Engagement with  Care Team Members & Providers.]  Level of Care Adult Daycare, Air traffic controller, Assisted Living, Skilled Nursing Facility  [Confirmed Disinterest in Enrollment in Adult Day Care Program. Confirmed Disinterest in Receiving Assistance Pursuing Higher Level of Care Placement Options (I.e Assisted Living Versus Skilled Nursing Facility).]  Applications Medicaid, Personal Care Services  [Confirmed Interest in Applying for Medicaid & Personal Care Services, Mailing Applications & Offering to Assist with Completion & Submission.]  Exercise Interventions   Exercise Discussed/Reviewed Exercise Discussed, Assistive device use and maintanence, Exercise Reviewed, Physical Activity, Weight Managment  [Encouraged Daily Exercise Regimen, as Tolerated.]  Physical Activity Discussed/Reviewed Physical Activity Discussed, Home Exercise Program (HEP), PREP, Physical Activity Reviewed, Gym, Types of exercise  [Encouraged Increased Level of Activity & Exercise, Inside & Outside the Home.]  Weight Management Weight loss  [Encouraged Healthy Weight Loss Regimen.]  Education Interventions   Education Provided Provided Therapist, sports, Provided Web-based Education, Provided Education  [employer] Material & Encouraged Independent Review.]  Provided Verbal Education On Nutrition, Mental Health/Coping with Illness, Foot Care, Eye Care, Applications, Labs, Blood Sugar Monitoring, Exercise, Medication, When to see the doctor, [facility], Development worker, community  [employer] Implementation of Educational Material Provided & Reviewed.]  Labs Reviewed Hgb A1c  [Reviewed & Encouraged Monitoring.]  Ship broker, Personal Care Services  [employer] Interest in Applying for [employer] & Personal Care Services, Teaching laboratory technician & Offering to Assist with Completion & Submission.]  Mental Health Interventions   Mental Health Discussed/Reviewed Mental Health Discussed, Mental Health Reviewed, Coping Strategies, Crisis,  Anxiety, Depression, Grief and Loss, Substance Abuse, Suicide, Other  [Assessed Mental Health & Cognitive Status.]  Nutrition Interventions   Nutrition Discussed/Reviewed Nutrition Discussed, Adding fruits and vegetables, Increasing proteins, Nutrition Reviewed, Carbohydrate meal planning, Decreasing sugar intake, Decreasing salt, Portion sizes, Fluid intake, Decreasing fats  [Encouraged  Heart-Healthy, Low Sodium, Reduced Fat Diet.]  Pharmacy Interventions   Pharmacy Dicussed/Reviewed Pharmacy Topics Discussed, Medications and their functions, Affording Medications, Medication Adherence, Pharmacy Topics Reviewed  [Confirmed Ability to Afford Prescription Medications.]  Medication Adherence --  [Confirmed Compliance with Prescription Medications.]  Safety Interventions   Safety Discussed/Reviewed Safety Discussed, Safety Reviewed, Fall Risk, Home Safety  [Encouraged Routine Use of Assistive Devices & Durable Medical Equipment.]  Home Safety Assistive Devices, Need for home safety assessment, Refer for community resources  [Encouraged Consideration of Home Health Physical Therapy Services & Home Safety Evaluation.]  Advanced Directive Interventions   Advanced Directives Discussed/Reviewed Advanced Directives Discussed, Advanced Directives Reviewed  [Confirmed Initiation of Advanced Directives (Living Will & Healthcare Power of Attorney Documents), Requesting Copies to Scan into Electronic Medical Record in Epic.]       Active Listening & Reflection Utilized.  Verbalization of Feelings Encouraged.  Emotional Support Provided. Problem Solving Interventions Employed. Solution-Focused Strategies Implemented. Acceptance & Commitment Therapy Performed. Cognitive Behavioral Therapy Initiated. Client-Centered Therapy Indicated. Encouraged Engagement with [employer], Nurse, adult, Tourist information centre manager, & Completion of Applications of Interest, from List Provided:  ~ Adult Day Care Programs  ~  In-Home Care & Respite Agencies ~ Home Health Care Agencies ~ Respite Care Agencies & Facilities ~ Home Care Services through Aging, Disability, & Transit Services of      [place] ~ [place] Application ~ Instructions for How to Apply for [employer] ~ Making Medicaid Accessible through [employer] Brochure ~ 2024 Medicaid Income Reserve Cheat Sheet ~ Merchant navy officer ~ Theatre manager Providers ~ Family Caregivers ~ Corporate investment banker Programs in [place] Encouraged Routine Engagement with Danford Bad, Licensed Clinical Social Worker with [employer], Population Health [phone]), if You Have Questions, Need Assistance, or If Additional Social Work Needs Are Identified Between Now & Our Next Follow-Up Outreach Call, Scheduled on 06/09/2023 at 11:15 AM.       Our next appointment is by telephone on 06/09/2023 at 11:15 am.  Please call the care guide team at [phone] if you need to cancel or reschedule your appointment.   If you are experiencing a Mental Health or Behavioral Health Crisis or need someone to talk to, please call the Suicide and Crisis Lifeline: 988 call the [place] National Suicide Prevention Lifeline: 873-240-0308 or TTY: (551)355-2761 TTY (774) 153-8657) to talk to a trained counselor call 1-800-273-TALK (toll free, 24 hour hotline) go to [facility] [address], [place] [phone]) call the [facility] Crisis Line: [phone] call 911  Patient verbalizes understanding of instructions and care plan provided today and agrees to view in MyChart. Active MyChart status and patient understanding of how to access instructions and care plan via MyChart confirmed with patient.     Telephone follow up appointment with care management team member scheduled for:  06/09/2023 at 11:15 am.  Danford Bad, BSW, MSW,  LCSW [place]  [facility], [facility] Clinical Social Worker II Direct Dial: [phone]  Fax: [phone] Website: [email or web address]

## 2023-05-23 ENCOUNTER — Telehealth: Payer: Self-pay

## 2023-05-27 ENCOUNTER — Encounter: Payer: Self-pay | Admitting: Oncology

## 2023-06-04 ENCOUNTER — Telehealth: Payer: Self-pay | Admitting: Pharmacist

## 2023-06-04 ENCOUNTER — Other Ambulatory Visit: Payer: Self-pay | Admitting: Pharmacist

## 2023-06-04 NOTE — Progress Notes (Signed)
06/04/2023 Name: Thomas [name] MRN: [id number] DOB: [birth year]  Chief Complaint  Patient presents with   Medication Management    Thomas [name] is a 78 y.o. year old male who presented for a telephone visit.   They were referred to the pharmacist by their PCP for assistance in managing complex medication management.  Patient reports he is doing "okay" today and ready to review medications.  Pharmacy will aim to assist in any way that we can.  Patient also has RN/LCSW on his care team to assist with SDOH.  Subjective:  Care Team: Primary Care Provider: Babs Sciara, MD    Medication Access/Adherence  Current Pharmacy:  [facility] [address], [place] - [address] [place] [address] [place] [address] [place] [address] Phone: [phone] Fax: [phone]  [id number] - [place], [place] - [address] AT [facility] [address] [place] [address] Phone: [phone] Fax: [phone]   Patient reports affordability concerns with their medications: Yes  Patient reports access/transportation concerns to their pharmacy: No  Patient reports adherence concerns with their medications:  Yes     Medication Management:  Current adherence strategy: pill bottles  Patient reports Fair adherence to medications Patient had pill bottles at home and went through each via telephone: -taking sertraline -Out of synthroid -Removed PPI from list--no longer taking -Breo ellipta is too expensive--will likely need to switch to [facility] for ease and the only patient assistance program available -will get together his OTC supplements for our visit tomorrow Patient reports the following barriers to adherence: cost  Recent fill dates:    Objective:  Lab Results  Component Value Date   CREATININE 0.98 04/29/2023   BUN 22 04/29/2023   NA 139 04/29/2023   K 4.2 04/29/2023   CL 107 04/29/2023   CO2 24 04/29/2023    Lab Results  Component Value Date    CHOL 242 (H) 08/20/2021   HDL 78 08/20/2021   LDLCALC 141 (H) 08/20/2021   TRIG 133 08/20/2021   CHOLHDL 3.1 08/20/2021    Medications Reviewed Today     Reviewed by Danella Maiers, [facility] (Pharmacist) on 06/04/23 at 1324  Med List Status: <None>   Medication Order Taking? Sig Documenting Provider Last Dose Status Informant  acetaminophen (TYLENOL) 500 MG tablet [id number]  Take 500 mg by mouth every 6 (six) hours as needed. [provider]  Active Self  albuterol (VENTOLIN HFA) 108 (90 Base) MCG/ACT inhaler [id number]  INHALE 2 PUFFS BY MOUTH EVERY 6 HOURS AS NEEDED FOR WHEEZING [name], Scott A, MD  Active   Ascorbic Acid (VITAMIN C) 100 MG tablet [id number]  Take 100 mg by mouth daily. [provider]  Active   BREO ELLIPTA 200-25 MCG/ACT AEPB [id number]  1 puff every morning. [provider]  Active   co-enzyme Q-10 30 MG capsule [id number]  Take 30 mg by mouth daily. [provider]  Active Self  ferrous sulfate 325 (65 FE) MG tablet [id number]  Take 325 mg by mouth daily with breakfast. [provider]  Active   fexofenadine (ALLEGRA) 180 MG tablet [id number]  Take 180 mg by mouth daily. [provider]  Active Self  FIBER PO [id number]  Take 1 tablet by mouth daily. [provider]  Active   fluticasone (FLONASE) 50 MCG/ACT nasal spray [id number]  Place 2 sprays into both nostrils daily. Babs Sciara, MD  Active   levothyroxine (SYNTHROID) 50 MCG tablet [id number]  Take  1 1/2 tablets on M W Fri Sat Sun and take 1 on Tues and Thursday Babs Sciara, MD  Active   Melatonin 1 MG TABS [id number]  Take 5 mg by mouth at bedtime as needed. [provider]  Active   Multiple Vitamins-Minerals (MULTIVITAMIN WITH MINERALS) tablet [id number]  Take 1 tablet by mouth daily. [provider]  Active   Omega-3 Fatty Acids (FISH OIL) 1000 MG CAPS [id number]  Take 1 capsule by mouth daily. [provider]  Active   Red Yeast  Rice Extract 300 MG CAPS [id number]  Take 1 tablet by mouth daily. [provider]  Active Self  sertraline (ZOLOFT) 100 MG tablet [id number]  Take 2 tablets (200 mg total) by mouth daily. Babs Sciara, MD  Active   Med List Note Metro Kung, LPN 87/56/43 3295): -              Assessment/Plan:   Medication Management: - Currently strategy sufficient to maintain appropriate adherence to prescribed medication regimen; cost is the issue, at times mood - Suggested use of weekly pill box to organize medications - Created list of medication, indication, and administration time. Provided to patient -Will aim to switch patient to [facility] via patient assistance tomorrow (AZ&me PAP)     Follow Up Plan: will f/u with patient when PharmD is at PCP office tomorrow at 2pm  Kieth Brightly, PharmD, BCACP, CPP Clinical Pharmacist, [facility]

## 2023-06-04 NOTE — Telephone Encounter (Signed)
   Patient is interested in enrolling in the AZ&me patient assistance program for [place].  Will send RX [facility] mail order (pharmacy for AZ&me patient assistance).  Patient is stable on current regimen of Breo, but is unable to afford. Please route me entire PAP.      Thank you! Kieth Brightly, PharmD, BCACP, CPP Clinical Pharmacist, [facility]

## 2023-06-05 ENCOUNTER — Other Ambulatory Visit: Payer: Self-pay | Admitting: Pharmacist

## 2023-06-05 NOTE — Progress Notes (Signed)
   06/05/2023 Name: Thomas [name] [name] DOB: 12/20/1945   Successful outreach to patient. Will move forward to obtain [facility] patient assistance via AZ&me patient assistance.  Message sent to PCP for transition to [facility]. Patient has current Magazine features editor supply at home to continue until [place] arrives  Kieth Brightly, PharmD, [place], CPP Clinical Pharmacist, [facility]

## 2023-06-07 ENCOUNTER — Other Ambulatory Visit: Payer: Self-pay | Admitting: Family Medicine

## 2023-06-07 MED ORDER — BREZTRI AEROSPHERE 160-9-4.8 MCG/ACT IN AERO: 2.0000 | INHALATION_SPRAY | Freq: Two times a day (BID) | RESPIRATORY_TRACT | 4 refills | Status: DC

## 2023-06-07 NOTE — Progress Notes (Signed)
Danella Maiers, RPH  Babs Sciara, MD Hi!  Would you be okay switching patient from [place] to [place]? Patient reports difficulty with breathing despite Breo usage and can no longer afford (still has current supply).  We can get Breztri with ease and it will be shipped to his home for "free".  If in agreement, you can escribe Breztri qty 32.1g with 4 refills to [facility] pharmacy in [place].  This goes straight to patient assistance program  Thank you! Raynelle Fanning  Per recommendation of the clinical pharmacist we are switching his medication

## 2023-06-09 ENCOUNTER — Ambulatory Visit: Payer: Self-pay | Admitting: *Deleted

## 2023-06-09 NOTE — Patient Instructions (Signed)
Visit Information  Thank you for taking time to visit with me today. Please don't hesitate to contact me if I can be of assistance to you.   Following are the goals we discussed today:   Goals Addressed               This Visit's Progress     Find Help in My Community. (pt-stated)   On track     Care Coordination Interventions:  Interventions Today    Flowsheet Row Most Recent Value  Chronic Disease   Chronic disease during today's visit Hypertension (HTN), Other  [Hypothyroidism, Prostate Cancer, Myalgia Due to Statin, Insomnia, Moderate Major Depression, Single Episode, Chronic Bilateral Low Back Pain without Sciatica, Limited Family Support, Unsteady Balance/Gait, Financial Insecurities, Loneliness & Isolation.]  General Interventions   General Interventions Discussed/Reviewed General Interventions Discussed, General Interventions Reviewed, Doctor Visits, Communication with, [employer], Level of Care  [Encouraged Routine Engagement with Care Team Members & Providers.]  Doctor Visits Discussed/Reviewed Doctor Visits Discussed, Specialist, Doctor Visits Reviewed, Annual Wellness Visits, PCP  [Encouraged Routine Engagement with Care Team Members & Providers.]  PCP/Specialist Visits Compliance with follow-up visit  [Encouraged Routine Engagement with Care Team Members & Providers.]  Communication with PCP/Specialists, Charity fundraiser, Pharmacists, Social Work  [employer] Routine Engagement with Care Team Members & Providers.]  Level of Care Adult Daycare, Assisted Living, Skilled Nursing Facility  [Confirmed Disinterest in Enrollment in Adult Day Care Program. Confirmed Disinterest in Receiving Assistance Pursuing Higher Level of Care Placement Options (I.e Assisted Living Versus Skilled Nursing Facility).]  Applications Medicaid, Personal Care Services  [Confirmed Disinterest in Applying for Medicaid or Personal Care Services.]  Education Interventions   Education Provided Provided  Education  [Encouraged Continued Independent Review of Educational Material Provided.]  Provided Verbal Education On When to see the doctor, Mental Health/Coping with Illness, Community Resources  [Encouraged Routine Engagement with Care Team Members & Providers.]  Applications Medicaid, Personal Care Services  [Confirmed Disinterest in Applying for Medicaid or Personal Care Services.]  Mental Health Interventions   Mental Health Discussed/Reviewed Mental Health Discussed, Mental Health Reviewed, Coping Strategies, Crisis, Anxiety, Depression, Grief and Loss, Substance Abuse, Suicide, Other  [Assessed Mental Health & Cognitive Status. Provided Counseling & Supportive Services. Offered Counseling Resources & Assistance with Referral Process. Encouraged Consideration of Marriage Counseling.]  Safety Interventions   Safety Discussed/Reviewed Safety Discussed  [Encouraged Routine Use of Assistive Devices & Durable Medical Equipment.]  Journalist, newspaper, Refer for community resources, Need for home safety assessment  [Encouraged Consideration of Home Health Physical Therapy Services & Home Safety Evaluation.]      Active Listening & Reflection Utilized.  Verbalization of Feelings Encouraged.  Emotional Support Provided. Solution-Focused Strategies Employed. Acceptance & Commitment Therapy Implemented. Cognitive Behavioral Therapy Indicated. Client-Centered Therapy Initiated. Encouraged Routine Engagement with Danford Bad, Licensed Clinical Social Worker with [facility], [facility] [phone]), if You Have Questions, Need Assistance, or If Additional Social Work Needs Are Identified Between Now & Our Next Follow-Up Outreach Call, Scheduled on 06/26/2023 at 12:15 PM.       Our next appointment is by telephone on 06/26/2023 at 12:15 pm.  Please call the care guide team at [phone] if you need to cancel or reschedule your appointment.   If  you are experiencing a Mental Health or Behavioral Health Crisis or need someone to talk to, please call the Suicide and Crisis Lifeline: 988 call the [place] National Suicide Prevention Lifeline: 502 015 0747 or TTY: 201-828-6038  TTY 843-079-3287) to talk to a trained counselor call 1-800-273-TALK (toll free, 24 hour hotline) go to [facility] Urgent [facility] [address], [place] [phone]) call the [facility] Crisis Line: [phone] call 911  Patient verbalizes understanding of instructions and care plan provided today and agrees to view in MyChart. Active MyChart status and patient understanding of how to access instructions and care plan via MyChart confirmed with patient.     Telephone follow up appointment with care management team member scheduled for:  06/26/2023 at 12:15 pm.   Danford Bad, BSW, MSW, LCSW   [facility], [facility] Clinical Social Worker II Direct Dial: [phone]  Fax: [phone] Website: [email or web address]

## 2023-06-09 NOTE — Patient Outreach (Signed)
Care Coordination   Follow Up Visit Note   06/09/2023  Name: Thomas [name] MRN: [id number] DOB: 01-03-1946  KAION TISDALE is a 78 y.o. year old male who sees [name], [name], MD for primary care. I spoke with [name] by phone today.  What matters to the patients health and wellness today?  Find Help in My Community.   Goals Addressed               This Visit's Progress     Find Help in My Community. (pt-stated)   On track     Care Coordination Interventions:  Interventions Today    Flowsheet Row Most Recent Value  Chronic Disease   Chronic disease during today's visit Hypertension (HTN), Other  [Hypothyroidism, Prostate Cancer, Myalgia Due to Statin, Insomnia, Moderate Major Depression, Single Episode, Chronic Bilateral Low Back Pain without Sciatica, Limited Family Support, Unsteady Balance/Gait, Financial Insecurities, Loneliness & Isolation.]  General Interventions   General Interventions Discussed/Reviewed General Interventions Discussed, General Interventions Reviewed, Doctor Visits, Communication with, [employer], Level of Care  [Encouraged Routine Engagement with Care Team Members & Providers.]  Doctor Visits Discussed/Reviewed Doctor Visits Discussed, Specialist, Doctor Visits Reviewed, Annual Wellness Visits, PCP  [Encouraged Routine Engagement with Care Team Members & Providers.]  PCP/Specialist Visits Compliance with follow-up visit  [Encouraged Routine Engagement with Care Team Members & Providers.]  Communication with PCP/Specialists, Charity fundraiser, Pharmacists, Social Work  [employer] Routine Engagement with Care Team Members & Providers.]  Level of Care Adult Daycare, Assisted Living, Skilled Nursing Facility  [Confirmed Disinterest in Enrollment in Adult Day Care Program. Confirmed Disinterest in Receiving Assistance Pursuing Higher Level of Care Placement Options (I.e Assisted Living Versus Skilled Nursing Facility).]  Applications Medicaid,  Personal Care Services  [Confirmed Disinterest in Applying for Medicaid or Personal Care Services.]  Education Interventions   Education Provided Provided Education  [Encouraged Continued Independent Review of Educational Material Provided.]  Provided Verbal Education On When to see the doctor, Mental Health/Coping with Illness, Community Resources  [Encouraged Routine Engagement with Care Team Members & Providers.]  Applications Medicaid, Personal Care Services  [Confirmed Disinterest in Applying for Medicaid or Personal Care Services.]  Mental Health Interventions   Mental Health Discussed/Reviewed Mental Health Discussed, Mental Health Reviewed, Coping Strategies, Crisis, Anxiety, Depression, Grief and Loss, Substance Abuse, Suicide, Other  [Assessed Mental Health & Cognitive Status. Provided Counseling & Supportive Services. Offered Counseling Resources & Assistance with Referral Process. Encouraged Consideration of Marriage Counseling.]  Safety Interventions   Safety Discussed/Reviewed Safety Discussed  [Encouraged Routine Use of Assistive Devices & Durable Medical Equipment.]  Journalist, newspaper, Refer for community resources, Need for home safety assessment  [Encouraged Consideration of Home Health Physical Therapy Services & Home Safety Evaluation.]      Active Listening & Reflection Utilized.  Verbalization of Feelings Encouraged.  Emotional Support Provided. Solution-Focused Strategies Employed. Acceptance & Commitment Therapy Implemented. Cognitive Behavioral Therapy Indicated. Client-Centered Therapy Initiated. Encouraged Routine Engagement with Danford Bad, Licensed Clinical Social Worker with [facility], [facility] [phone]), if You Have Questions, Need Assistance, or If Additional Social Work Needs Are Identified Between Now & Our Next Follow-Up Outreach Call, Scheduled on 06/26/2023 at 12:15 PM.       SDOH assessments  and interventions completed:  Yes.  Care Coordination Interventions:  Yes, provided.   Follow up plan: Follow up call scheduled for 06/26/2023 at 12:15 pm.  Encounter Outcome:  Patient Visit Completed.  Danford Bad, BSW, MSW, LCSW [facility], [facility] Clinical Social Worker II Direct Dial: [phone]  Fax: [phone] Website: [email or web address]

## 2023-06-10 ENCOUNTER — Telehealth: Payer: Self-pay

## 2023-06-10 NOTE — Progress Notes (Unsigned)
Pharmacy Medication Assistance Program Note    06/10/2023  Patient ID: Thomas [name], male   DOB: January 10, 1946, 78 y.o.   MRN: [id number]     06/10/2023  Outreach [employer]  Manufacturer Medication One Nurse, adult Drugs [employer]  Type of Radiographer, therapeutic Assistance  Date Application Sent to Prescriber 06/10/2023     Emailed to [employer]

## 2023-06-12 ENCOUNTER — Other Ambulatory Visit: Payer: Self-pay | Admitting: Family Medicine

## 2023-06-12 MED ORDER — BREZTRI AEROSPHERE 160-9-4.8 MCG/ACT IN AERO: 2.0000 | INHALATION_SPRAY | Freq: Two times a day (BID) | RESPIRATORY_TRACT | 4 refills | Status: AC

## 2023-06-12 NOTE — Telephone Encounter (Signed)
[place] [facility] PATIENT  Submitted application for BREZTRI to [place] for patient assistance.   Phone: [phone]  Please send new 90 day RX to [facility] pharmacy for new enrollment. Thanks!

## 2023-06-19 ENCOUNTER — Encounter: Payer: Self-pay | Admitting: Oncology

## 2023-06-25 ENCOUNTER — Ambulatory Visit: Payer: No Typology Code available for payment source | Admitting: Family Medicine

## 2023-06-26 ENCOUNTER — Ambulatory Visit: Payer: Self-pay | Admitting: *Deleted

## 2023-06-26 ENCOUNTER — Ambulatory Visit: Payer: Self-pay | Admitting: Family Medicine

## 2023-06-26 NOTE — Telephone Encounter (Signed)
 Chief Complaint: Vomiting Symptoms: Nausea, vomiting X 2, feels feverish Frequency: Onset today  Pertinent Negatives: Patient denies diarrhea, pain  Disposition: [ ] ED /[ ] Urgent Care (no appt availability in office) / [ X]Appointment(In office/virtual)/ [ ]  [place] [facility]/ [ ] Home Care/ [ ] Refused Recommended Disposition /[ ] [place] [facility]/ [ ]  Follow-up with PCP  Additional Notes: Patient called in with complaint of nausea, vomited X 2 today, feels feverish but has not measured temperature. Able to hold down fluids, voiding well. Would like medication to help with nausea. Lives alone. Unsteady on his feet, ambulates with cane.    Copied from CRM [id number]. Topic: Clinical - Red Word Triage >> Jun 26, 2023 11:46 AM [place] B wrote: [facility] that prompted transfer to Nurse Triage:  pt says feel warm, feverish and vomited 2 times this morning Reason for Disposition  MILD or MODERATE vomiting (e.g., 1 - 5 times / day)  Answer Assessment - Initial Assessment Questions 1. VOMITING SEVERITY: "How many times have you vomited in the past 24 hours?"     - MILD:  1 - 2 times/day    - MODERATE: 3 - 5 times/day, decreased oral intake without significant weight loss or symptoms of dehydration    - SEVERE: 6 or more times/day, vomits everything or nearly everything, with significant weight loss, symptoms of dehydration      Mild X 2 times today 2. ONSET: "When did the vomiting begin?"      This morning 3. FLUIDS: "What fluids or food have you vomited up today?" "Have you been able to keep any fluids down?"     Breakfast. Drinking fluid and able to hold down 4. ABDOMEN PAIN: "Are your having any abdomen pain?" If Yes : "How bad is it and what does it feel like?" (e.g., crampy, dull, intermittent, constant)      No 5. DIARRHEA: "Is there any diarrhea?" If Yes, ask: "How many times today?"      No 6. CONTACTS: "Is there anyone else in the family with the same symptoms?"      No 7.  CAUSE: "What do you think is causing your vomiting?"     Unknown 8. HYDRATION STATUS: "Any signs of dehydration?" (e.g., dry mouth [not only dry lips], too weak to stand) "When did you last urinate?"     Drinking fluids, voiding well  9. OTHER SYMPTOMS: "Do you have any other symptoms?" (e.g., fever, headache, vertigo, vomiting blood or coffee grounds, recent head injury)     Feels feverish, temperature not measured.  Protocols used: Vomiting-A-AH

## 2023-06-26 NOTE — Patient Outreach (Signed)
 Care Coordination   Follow Up Visit Note   06/26/2023  Name: Thomas [name] [name] DOB: 1945/06/14  Thomas [name] is a 78 y.o. year old male who sees Thomas [name], Thomas Coup, Thomas [name] for primary care. I spoke with Thomas [name] by phone today.  What matters to the patients health and wellness today?  Find Help in My Community.    Goals Addressed               This Visit's Progress     COMPLETED: Find Help in My Community. (pt-stated)   On track     Care Coordination Interventions:  Interventions Today    Flowsheet Row Most Recent Value  Chronic Disease   Chronic disease during today's visit Hypertension (HTN), Other  [Hypothyroidism, Prostate Cancer, Myalgia Due to Statin, Insomnia, Moderate Major Depression, Single Episode, Chronic Bilateral Low Back Pain without Sciatica, Limited Family Support, Unsteady Balance/Gait, Financial Insecurities, Loneliness & Isolation.]  General Interventions   General Interventions Discussed/Reviewed General Interventions Discussed, General Interventions Reviewed, Doctor Visits, Communication with, [facility], Level of Care  [Encouraged Routine Engagement with Care Team Members & Providers.]  Doctor Visits Discussed/Reviewed Doctor Visits Discussed, Specialist, Doctor Visits Reviewed, Annual Wellness Visits, PCP  [Encouraged Routine Engagement with Care Team Members & Providers.]  PCP/Specialist Visits Compliance with follow-up visit  [Encouraged Routine Engagement with Care Team Members & Providers.]  Communication with PCP/Specialists, Charity fundraiser, Pharmacists, Social Work  [employer] Routine Engagement with Care Team Members & Providers.]  Level of Care Adult Daycare, Assisted Living, Skilled Nursing Facility  [Confirmed Disinterest in Enrollment in Adult Day Care Program. Confirmed Disinterest in Receiving Assistance Pursuing Higher Level of Care Placement Options (I.e Assisted Living Versus Skilled Nursing Facility).]  Applications  Medicaid, Personal Care Services  [Confirmed Disinterest in Applying for Medicaid or Personal Care Services.]  Education Interventions   Education Provided Provided Education  [Encouraged Continued Independent Review of Educational Material Provided.]  Provided Verbal Education On When to see the doctor, Mental Health/Coping with Illness, Community Resources  [Encouraged Routine Engagement with Care Team Members & Providers.]  Applications Medicaid, Personal Care Services  [Confirmed Disinterest in Applying for Medicaid or Personal Care Services.]  Mental Health Interventions   Mental Health Discussed/Reviewed Mental Health Discussed, Mental Health Reviewed, Coping Strategies, Crisis, Anxiety, Depression, Grief and Loss, Substance Abuse, Suicide, Other  [Assessed Mental Health & Cognitive Status. Provided Counseling & Supportive Services. Offered Counseling Resources & Assistance with Referral Process. Encouraged Consideration of Marriage Counseling.]  Safety Interventions   Safety Discussed/Reviewed Safety Discussed  [Encouraged Routine Use of Assistive Devices & Durable Medical Equipment.]  Journalist, newspaper, Refer for community resources, Need for home safety assessment  [Encouraged Consideration of Home Health Physical Therapy Services & Home Safety Evaluation.]      Active Listening & Reflection Utilized.  Verbalization of Feelings Encouraged.  Emotional Support Provided. Acceptance & Commitment Therapy Performed. Cognitive Behavioral Therapy Initiated. Encouraged Engagement with Danford Bad, Licensed Clinical Social Worker with [facility], [facility] [phone]), if You Have Questions, Need Assistance, Additional Social Work Needs Are Identified in The Near Future, or If You Change Your Mind About Wanting to Receive Social Work Services.       SDOH assessments and interventions completed:  Yes.  Care Coordination Interventions:   Yes, provided.   Follow up plan: No further intervention required.   Encounter Outcome:  Patient Visit Completed.   Danford Bad, BSW, MSW, LCSW [facility], [facility] Clinical Social Worker II Direct Dial: [phone]  Fax: [phone] Website: [email or web address]

## 2023-06-26 NOTE — Patient Instructions (Signed)
 Visit Information  Thank you for taking time to visit with me today. Please don't hesitate to contact me if I can be of assistance to you.   Following are the goals we discussed today:   Goals Addressed               This Visit's Progress     COMPLETED: Find Help in My Community. (pt-stated)   On track     Care Coordination Interventions:  Interventions Today    Flowsheet Row Most Recent Value  Chronic Disease   Chronic disease during today's visit Hypertension (HTN), Other  [Hypothyroidism, Prostate Cancer, Myalgia Due to Statin, Insomnia, Moderate Major Depression, Single Episode, Chronic Bilateral Low Back Pain without Sciatica, Limited Family Support, Unsteady Balance/Gait, Financial Insecurities, Loneliness & Isolation.]  General Interventions   General Interventions Discussed/Reviewed General Interventions Discussed, General Interventions Reviewed, Doctor Visits, Communication with, [employer], Level of Care  [Encouraged Routine Engagement with Care Team Members & Providers.]  Doctor Visits Discussed/Reviewed Doctor Visits Discussed, Specialist, Doctor Visits Reviewed, Annual Wellness Visits, PCP  [Encouraged Routine Engagement with Care Team Members & Providers.]  PCP/Specialist Visits Compliance with follow-up visit  [Encouraged Routine Engagement with Care Team Members & Providers.]  Communication with PCP/Specialists, Charity fundraiser, Pharmacists, Social Work  [employer] Routine Engagement with Care Team Members & Providers.]  Level of Care Adult Daycare, Assisted Living, Skilled Nursing Facility  [Confirmed Disinterest in Enrollment in Adult Day Care Program. Confirmed Disinterest in Receiving Assistance Pursuing Higher Level of Care Placement Options (I.e Assisted Living Versus Skilled Nursing Facility).]  Applications Medicaid, Personal Care Services  [Confirmed Disinterest in Applying for Medicaid or Personal Care Services.]  Education Interventions   Education Provided  Provided Education  [Encouraged Continued Independent Review of Educational Material Provided.]  Provided Verbal Education On When to see the doctor, Mental Health/Coping with Illness, Community Resources  [Encouraged Routine Engagement with Care Team Members & Providers.]  Applications Medicaid, Personal Care Services  [Confirmed Disinterest in Applying for Medicaid or Personal Care Services.]  Mental Health Interventions   Mental Health Discussed/Reviewed Mental Health Discussed, Mental Health Reviewed, Coping Strategies, Crisis, Anxiety, Depression, Grief and Loss, Substance Abuse, Suicide, Other  [Assessed Mental Health & Cognitive Status. Provided Counseling & Supportive Services. Offered Counseling Resources & Assistance with Referral Process. Encouraged Consideration of Marriage Counseling.]  Safety Interventions   Safety Discussed/Reviewed Safety Discussed  [Encouraged Routine Use of Assistive Devices & Durable Medical Equipment.]  Journalist, newspaper, Refer for community resources, Need for home safety assessment  [Encouraged Consideration of Home Health Physical Therapy Services & Home Safety Evaluation.]      Active Listening & Reflection Utilized.  Verbalization of Feelings Encouraged.  Emotional Support Provided. Acceptance & Commitment Therapy Performed. Cognitive Behavioral Therapy Initiated. Encouraged Engagement with Danford Bad, Licensed Clinical Social Worker with [facility], [facility] [phone]), if You Have Questions, Need Assistance, Additional Social Work Needs Are Identified in The Near Future, or If You Change Your Mind About Wanting to Receive Social Work Services.       Please call the care guide team at [phone] if you need to cancel or reschedule your appointment.   If you are experiencing a Mental Health or Behavioral Health Crisis or need someone to talk to, please call the Suicide and Crisis Lifeline:  988 call the [place] National Suicide Prevention Lifeline: 8734040990 or TTY: (248)745-5859 TTY 612-089-8102) to talk to a trained counselor call 1-800-273-TALK (toll free, 24 hour hotline) go to  [facility] Urgent [facility] [address] [place] [phone]) call the [facility] Crisis Line: [phone] call 911  Patient verbalizes understanding of instructions and care plan provided today and agrees to view in MyChart. Active MyChart status and patient understanding of how to access instructions and care plan via MyChart confirmed with patient.     No further follow up required.  Danford Bad, BSW, MSW, LCSW [facility], [facility] Clinical Social Worker II Direct Dial: [phone]  Fax: [phone] Website: [email or web address]

## 2023-06-27 ENCOUNTER — Ambulatory Visit: Payer: Self-pay | Admitting: Internal Medicine

## 2023-06-27 ENCOUNTER — Encounter: Payer: Self-pay | Admitting: Oncology

## 2023-06-27 NOTE — Telephone Encounter (Signed)
 Patient states he feels much better today with no further problems

## 2023-06-27 NOTE — Telephone Encounter (Signed)
 Zofran tablet 8 mg, 1 tablet every 8 hours as needed for nausea, #12 with 1 refill  Please talk with patient.  If he is experiencing a lot of fever chills body aches not feeling good he should consider urgent care today they could test him for the flu this is been quite prevalent lately-our schedule full  Also if over the next 24 to 48 hours he is not improving he should certainly get checked out if he does not go today thank you

## 2023-07-04 ENCOUNTER — Ambulatory Visit: Payer: Self-pay | Admitting: *Deleted

## 2023-07-04 NOTE — Patient Outreach (Signed)
 Care Coordination   Collaboration with pcp staff  Visit Note   07/04/2023 Name: Thomas [name] MRN: [id number] DOB: [birth year]  Thomas [name] is a 78 y.o. year old male who sees [name], [name], MD for primary care. I  spoke with pcp staff, Tammy & Cicero Duck  What matters to the patients health and wellness today?  Pcp staff voiced concerns about patient needing social assistance related to bills during an office visit  Update pcp staff of VBCI interventions from February to June 26 2023 Patient refusal of all resources and services offered   Goals Addressed               This Visit's Progress     Patient Stated     COMPLETED: Find Help in My Community. (pt-stated)        Care Coordination Interventions:  Interventions Today    Flowsheet Row Most Recent Value  Chronic Disease   Chronic disease during today's visit Hypertension (HTN), Other  [Hypothyroidism, Prostate Cancer, Myalgia Due to Statin, Insomnia, Moderate Major Depression, Single Episode, Chronic Bilateral Low Back Pain without Sciatica, Limited Family Support, Unsteady Balance/Gait, Financial Insecurities, Loneliness & Isolation.]  General Interventions   General Interventions Discussed/Reviewed General Interventions Discussed, General Interventions Reviewed, Doctor Visits, Communication with, [employer], Level of Care  [Encouraged Routine Engagement with Care Team Members & Providers.]  Doctor Visits Discussed/Reviewed Doctor Visits Discussed, Specialist, Doctor Visits Reviewed, Annual Wellness Visits, PCP  [Encouraged Routine Engagement with Care Team Members & Providers.]  PCP/Specialist Visits Compliance with follow-up visit  [Encouraged Routine Engagement with Care Team Members & Providers.]  Communication with PCP/Specialists, Charity fundraiser, Pharmacists, Social Work  [employer] Routine Engagement with Care Team Members & Providers.]  Level of Care Adult Daycare, Assisted Living, Skilled Nursing Facility   [Confirmed Disinterest in Enrollment in Adult Day Care Program. Confirmed Disinterest in Receiving Assistance Pursuing Higher Level of Care Placement Options (I.e Assisted Living Versus Skilled Nursing Facility).]  Applications Medicaid, Personal Care Services  [Confirmed Disinterest in Applying for Medicaid or Personal Care Services.]  Education Interventions   Education Provided Provided Education  [Encouraged Continued Independent Review of Educational Material Provided.]  Provided Verbal Education On When to see the doctor, Mental Health/Coping with Illness, Community Resources  [Encouraged Routine Engagement with Care Team Members & Providers.]  Applications Medicaid, Personal Care Services  [Confirmed Disinterest in Applying for Medicaid or Personal Care Services.]  Mental Health Interventions   Mental Health Discussed/Reviewed Mental Health Discussed, Mental Health Reviewed, Coping Strategies, Crisis, Anxiety, Depression, Grief and Loss, Substance Abuse, Suicide, Other  [Assessed Mental Health & Cognitive Status. Provided Counseling & Supportive Services. Offered Counseling Resources & Assistance with Referral Process. Encouraged Consideration of Marriage Counseling.]  Safety Interventions   Safety Discussed/Reviewed Safety Discussed  [Encouraged Routine Use of Assistive Devices & Durable Medical Equipment.]  Journalist, newspaper, Refer for community resources, Need for home safety assessment  [Encouraged Consideration of Home Health Physical Therapy Services & Home Safety Evaluation.]      Active Listening & Reflection Utilized.  Verbalization of Feelings Encouraged.  Emotional Support Provided. Acceptance & Commitment Therapy Performed. Cognitive Behavioral Therapy Initiated. Encouraged Engagement with Danford Bad, Licensed Clinical Social Worker with [facility], [facility] [phone]), if You Have Questions, Need Assistance, Additional  Social Work Needs Are Identified in The Near Future, or If You Change Your Mind About Wanting to Receive Social Work Services.       Other  community resources - Psychologist, prison and probation services        Patient without general listed major medical diagnoses for [facility] care manager services VBCI SW addressed and offered services for his depression and social needs until 06/26/23- resulting in patient refusal of services Case closure with encouragement of pcp staff to guide patient to DSS, have MD refer him to VBCI again  Interventions Today    Flowsheet Row Most Recent Value  Chronic Disease   Chronic disease during today's visit Other  [RN CM informed by pcp staff that patient needed assistance. He is reported to have requested assistance from pcp receptionist Tammy to help sort his bills RN CM able to see the VBCI SW was active with patient until 06/26/23 Pt noted to refuse resources]  General Interventions   General Interventions Discussed/Reviewed General Interventions Discussed, [employer], Communication with, Doctor Visits  [patient noted to refuse personal care services, facility placement, community resources from VBCI]  Doctor Visits Discussed/Reviewed Doctor Visits Discussed, PCP  Communication with PCP/Specialists, RN  [Encouraged pcp staff to refer patient to DSS as needed or have MD re referral to [facility] SW]              SDOH assessments and interventions completed:  No     Care Coordination Interventions:  Yes, provided   Follow up plan: No further intervention required. Encouraged referral for VBCI social worker for SDOH needs if patient agreeable     Encounter Outcome:  Patient Visit Completed   Cala Bradford L. Noelle Penner, RN, BSN, CCM [place]  [facility], [facility] Health RN Care Manager Direct Dial: [phone]  Fax: [phone]

## 2023-07-14 ENCOUNTER — Ambulatory Visit: Payer: Self-pay | Admitting: *Deleted

## 2023-07-14 DIAGNOSIS — E038 Other specified hypothyroidism: Secondary | ICD-10-CM

## 2023-07-14 DIAGNOSIS — D649 Anemia, unspecified: Secondary | ICD-10-CM

## 2023-07-14 DIAGNOSIS — G4700 Insomnia, unspecified: Secondary | ICD-10-CM

## 2023-07-14 DIAGNOSIS — M791 Myalgia, unspecified site: Secondary | ICD-10-CM

## 2023-07-14 DIAGNOSIS — F321 Major depressive disorder, single episode, moderate: Secondary | ICD-10-CM

## 2023-07-14 DIAGNOSIS — M545 Low back pain, unspecified: Secondary | ICD-10-CM

## 2023-07-14 DIAGNOSIS — C61 Malignant neoplasm of prostate: Secondary | ICD-10-CM

## 2023-07-14 NOTE — Patient Outreach (Signed)
 Care Coordination   Initial Visit Note   07/14/2023 Name: Thomas [name] [name] DOB: 1945-06-01  Thomas [name] is a 78 y.o. year old male who sees [name], [name], MD for primary care. I spoke with  Thomas [name] by phone today after RN CM received a message from pcp staff, Thomas [name].  Patient visited pcp office today requesting assistance with paying his bills.   What matters to the patients health and wellness today?  Social/paying bill concerns, Memory- forget to pay bills Worried about losing drivers license,   Thomas [name] voiced appreciation of the outreach.  He is able to recall outreaches from Social worker Thomas [name]. During those outreaches, he refused services offered. Today he is reported he is willing to accept assistance.  Socially, he reports he does not have support. Has a friend from [place] that stops by. No family support. "Wife left me almost five years ago" Reports his daughter "does not want to have anything to do with me"  Can drive, cook, uses a cane for walking, history of falls- fell off front porch, stitches in forehead  He agreed to conference calls with RN CM to [facility], & Social security office (SSA)  After leaving a message with Thomas [name] for Thomas [name] from Social service (SSI) APS intake called and recommend patient stop by the office and outreach to financial pathways to request assistance with a payee or a payee from SSI. Thomas [name] agreed to these services.   Patient voiced understanding of the need to obtain a payee as recommended by Thomas [name] at pcp office updated. Thomas [name] shared the patient was to be seen today at his home by and insurance person. Patient has health team advantage (HTA) & medicare coverage. He voiced understanding that HTA provides medicare part C services  Patient reports he will answer any incoming calls from SSI, SSA, financial pathways Agrees to further outreach from RN CM and future RN CM &  Child psychotherapist.   Goals Addressed             This Visit's Progress    community resources - VBCI Care manager       Patient without general listed major medical diagnoses for [facility] care manager services VBCI SW addressed and offered services for his depression and social needs until 06/26/23- resulting in patient refusal of services Case closure with encouragement of pcp staff to guide patient to DSS, have MD refer him to VBCI again  Interventions Today    Flowsheet Row Most Recent Value  Chronic Disease   Chronic disease during today's visit Other  [memory changes, finances, need payee, social security/social services/Social worker services needed. lack of family support]  General Interventions   General Interventions Discussed/Reviewed General Interventions Discussed, [employer], Doctor Visits, Communication with  Doctor Visits Discussed/Reviewed Doctor Visits Discussed, PCP  PCP/Specialist Visits Compliance with follow-up visit  Communication with PCP/Specialists, Charity fundraiser, Social Work  Public house manager with Careers adviser, SSI, SSA, social work referral]  Exercise Interventions   Exercise Discussed/Reviewed Exercise Discussed, Physical Activity, Assistive device use and maintanence  [confirmed uses cane for mobility history of falls]  Physical Activity Discussed/Reviewed Physical Activity Discussed, Home Exercise Program (HEP)  Education Interventions   Education Provided Provided Web-based Education, Provided Education  Provided Actuary, [employer], [employer], Other  [reviewed his insurance coverage as medicare and health team advantage. Discussed not needing other coverage, discussed medicare part A, B C, D, financial pathways payee services  SSI, SSA, pcp office not able to offer budgeting/bill services]  Mental Health Interventions   Mental Health Discussed/Reviewed Mental Health Discussed, Coping Strategies, Refer to Social Work for resources   Refer to Social Work for resources regarding Other  Nutrition Interventions   Nutrition Discussed/Reviewed Nutrition Discussed, Fluid intake, Carbohydrate meal planning  [confirms he is able to get meals]  Safety Interventions   Safety Discussed/Reviewed Home Safety  Home Safety Assistive Devices  Advanced Directive Interventions   Advanced Directives Discussed/Reviewed Advanced Directives Discussed, Advanced Care Planning  [had documents]              SDOH assessments and interventions completed:  Yes     Care Coordination Interventions:  Yes, provided   Follow up plan: Follow up call scheduled for 07/16/23    Encounter Outcome:  Patient Visit Completed   Thomas Bradford L. Noelle Penner, RN, BSN, CCM [place]  [facility], [facility] Health RN Care Manager Direct Dial: [phone]  Fax: [phone]

## 2023-07-14 NOTE — Patient Instructions (Addendum)
 Visit Information  Thank you for taking time to visit with me today. Please don't hesitate to contact me if I can be of assistance to you.   [employer] of social services - Jill Alexanders, intake coordinator [phone] Recommends you receive assistance from the social security office [phone] and/or  [place] [facility] [place]  [address] [place]  [place], [place] [address] [phone]  Following are the goals we discussed today:   Goals Addressed             This Visit's Progress    community resources - VBCI Care manager       Patient without general listed major medical diagnoses for [facility] care manager services VBCI SW addressed and offered services for his depression and social needs until 06/26/23- resulting in patient refusal of services Case closure with encouragement of pcp staff to guide patient to DSS, have MD refer him to VBCI again  Interventions Today    Flowsheet Row Most Recent Value  Chronic Disease   Chronic disease during today's visit Other  [memory changes, finances, need payee, social security/social services/Social worker services needed. lack of family support]  General Interventions   General Interventions Discussed/Reviewed General Interventions Discussed, [employer], Doctor Visits, Communication with  Doctor Visits Discussed/Reviewed Doctor Visits Discussed, PCP  PCP/Specialist Visits Compliance with follow-up visit  Communication with PCP/Specialists, Charity fundraiser, Social Work  Public house manager with Careers adviser, SSI, SSA, social work referral]  Exercise Interventions   Exercise Discussed/Reviewed Exercise Discussed, Physical Activity, Assistive device use and maintanence  [confirmed uses cane for mobility history of falls]  Physical Activity Discussed/Reviewed Physical Activity Discussed, Home Exercise Program (HEP)  Education Interventions   Education Provided Provided Web-based Education, Provided Education   Provided Actuary, [employer], [employer], Other  [reviewed his insurance coverage as medicare and health team advantage. Discussed not needing other coverage, discussed medicare part A, B C, D, financial pathways payee services SSI, SSA, pcp office not able to offer budgeting/bill services]  Mental Health Interventions   Mental Health Discussed/Reviewed Mental Health Discussed, Coping Strategies, Refer to Social Work for resources  Refer to Social Work for resources regarding Other  Nutrition Interventions   Nutrition Discussed/Reviewed Nutrition Discussed, Fluid intake, Carbohydrate meal planning  [confirms he is able to get meals]  Safety Interventions   Safety Discussed/Reviewed Home Safety  Home Safety Assistive Devices  Advanced Directive Interventions   Advanced Directives Discussed/Reviewed Advanced Directives Discussed, Advanced Care Planning  [had documents]              Our next appointment is by telephone on 07/16/23 at 1100  Please call the care guide team at [phone] if you need to cancel or reschedule your appointment.   If you are experiencing a Mental Health or Behavioral Health Crisis or need someone to talk to, please call the Suicide and Crisis Lifeline: 988 call the [place] National Suicide Prevention Lifeline: 443-424-8839 or TTY: (918)471-9131 TTY 914-627-3207) to talk to a trained counselor call 1-800-273-TALK (toll free, 24 hour hotline) call the [facility]: [phone]) [phone] call 911   Patient verbalizes understanding of instructions and care plan provided today and agrees to view in MyChart. Active MyChart status and patient understanding of how to access instructions and care plan via MyChart confirmed with patient.     The patient has been provided with contact information for the care management team and has  been advised to call with any health related questions or concerns.    Cherity Blickenstaff L. Noelle Penner,  RN, BSN, CCM [place]  [facility], [facility] Health RN Care Manager Direct Dial: [phone]  Fax: [phone]

## 2023-07-16 ENCOUNTER — Ambulatory Visit: Payer: Self-pay | Admitting: *Deleted

## 2023-07-16 NOTE — Patient Outreach (Signed)
 Care Coordination   07/16/2023 Name: Thomas [name] [name] DOB: [birth year]   Care Coordination Outreach Attempts:  An unsuccessful outreach was attempted for an appointment today.  Follow Up Plan:  Additional outreach attempts will be made to offer the patient complex care management information and services.   Encounter Outcome:  No Answer   Care Coordination Interventions:  Yes, provided    Sneha Willig L. Noelle Penner, RN, BSN, CCM [place]  [facility], [facility] Health RN Care Manager Direct Dial: [phone]  Fax: [phone]

## 2023-07-16 NOTE — Patient Outreach (Signed)
 Care Coordination   07/16/2023 Name: Thomas [name] [name] DOB: [birth year]   Care Coordination Outreach Attempts:  An unsuccessful outreach was attempted for an appointment today.  Follow Up Plan:  Additional outreach attempts will be made to offer the patient complex care management information and services.   Encounter Outcome:  No Answer   Care Coordination Interventions:  Yes, provided call attempts x 2 from pcp office & from RN CM number    Cala Bradford L. Noelle Penner, RN, BSN, CCM [place]  [facility], [facility] Health RN Care Manager Direct Dial: [phone]  Fax: [phone]

## 2023-07-18 ENCOUNTER — Telehealth: Payer: Self-pay

## 2023-07-18 NOTE — Progress Notes (Signed)
 Complex Care Management Note  Care Guide Note 07/18/2023 Name: Thomas [name] MRN: [id number] DOB: 1945/05/04  Thomas [name] is a 78 y.o. year old male who sees Thomas [name], Thomas Coup, MD for primary care. I reached out to [name] by phone today to offer complex care management services.  [name] was given information about Complex Care Management services today including:   The Complex Care Management services include support from the care team which includes your Nurse Care Manager, Clinical Social Worker, or Pharmacist.  The Complex Care Management team is here to help remove barriers to the health concerns and goals most important to you. Complex Care Management services are voluntary, and the patient may decline or stop services at any time by request to their care team member.   Complex Care Management Consent Status: Patient agreed to services and verbal consent obtained.   Follow up plan:  Telephone appointment with complex care management team member scheduled for:  07/23/2023  Encounter Outcome:  Patient Scheduled  Penne Lash , RMA     [place]  [facility], [facility] Guide  Direct Dial: [phone]  Website: [email or web address]

## 2023-07-21 ENCOUNTER — Encounter: Payer: Self-pay | Admitting: Oncology

## 2023-07-21 ENCOUNTER — Inpatient Hospital Stay: Payer: No Typology Code available for payment source

## 2023-07-21 ENCOUNTER — Inpatient Hospital Stay: Payer: No Typology Code available for payment source | Admitting: Oncology

## 2023-07-23 ENCOUNTER — Ambulatory Visit: Payer: Self-pay

## 2023-07-23 NOTE — Patient Outreach (Signed)
 Care Coordination   Initial Visit Note   07/23/2023 Name: Thomas [name] MRN: [id number] DOB: 1946/01/15  Thomas [name] is a 78 y.o. year old male who sees [name], [name], MD for primary care. I spoke with  Thomas [name] by phone today.  What matters to the patients health and wellness today?  Patient reports memory issues and needs help with managing bills.    Goals Addressed             This Visit's Progress    Money Management       Interventions Today    Flowsheet Row Most Recent Value  Chronic Disease   Chronic disease during today's visit Other  [Cancer and memory issues]  General Interventions   General Interventions Discussed/Reviewed General Interventions Discussed, General Interventions Reviewed, [employer] is behind on bills because he forgot.Pt reports he has sufficient income for bills.Family member Thomas [name] is coming this week to get everything caught up.Pt will ask Thomas [name] to continue to assist each month.Pt declined Representative Payee.]  Education Interventions   Education Provided Provided Education  [Pt educated on [employer] program. SW explained it would be an agency assigned to manage income and bills. Pt prefers to allow Thomas [name] to assist as payee instead of an agency.]              SDOH assessments and interventions completed:  Yes  SDOH Interventions Today    Flowsheet Row Most Recent Value  SDOH Interventions   Food Insecurity Interventions Intervention Not Indicated  Housing Interventions Intervention Not Indicated  Transportation Interventions Intervention Not Indicated  Utilities Interventions Intervention Not Indicated        Care Coordination Interventions:  Yes, provided   Follow up plan: Follow up call scheduled for 08/06/23 at 11am.    Encounter Outcome:  Patient Visit Completed

## 2023-07-23 NOTE — Patient Instructions (Signed)
 Visit Information  Thank you for taking time to visit with me today. Please don't hesitate to contact me if I can be of assistance to you.   Following are the goals we discussed today:  Patient to speak to family about managing finances.   Our next appointment is by telephone on 08/06/23 at 11am.  Please call the care guide team at [phone] if you need to cancel or reschedule your appointment.   If you are experiencing a Mental Health or Behavioral Health Crisis or need someone to talk to, please call 911  Patient verbalizes understanding of instructions and care plan provided today and agrees to view in MyChart. Active MyChart status and patient understanding of how to access instructions and care plan via MyChart confirmed with patient.     Telephone follow up appointment with care management team member scheduled for:  08/06/23 at 11am.   Lysle Morales, BSW [place]  [facility], [facility] Social Worker Direct Dial: [phone]  Fax: [phone] Website: [email or web address]

## 2023-07-28 ENCOUNTER — Encounter: Payer: Self-pay | Admitting: Oncology

## 2023-07-28 ENCOUNTER — Inpatient Hospital Stay (HOSPITAL_BASED_OUTPATIENT_CLINIC_OR_DEPARTMENT_OTHER): Admitting: Oncology

## 2023-07-28 ENCOUNTER — Inpatient Hospital Stay: Attending: Oncology

## 2023-07-28 VITALS — BP 126/67 | HR 94 | Temp 96.6°F | Resp 19 | Ht 68.0 in | Wt 169.0 lb

## 2023-07-28 DIAGNOSIS — E039 Hypothyroidism, unspecified: Secondary | ICD-10-CM | POA: Diagnosis not present

## 2023-07-28 DIAGNOSIS — C61 Malignant neoplasm of prostate: Secondary | ICD-10-CM | POA: Diagnosis not present

## 2023-07-28 DIAGNOSIS — Z79818 Long term (current) use of other agents affecting estrogen receptors and estrogen levels: Secondary | ICD-10-CM | POA: Diagnosis not present

## 2023-07-28 DIAGNOSIS — Z87891 Personal history of nicotine dependence: Secondary | ICD-10-CM | POA: Diagnosis not present

## 2023-07-28 DIAGNOSIS — E785 Hyperlipidemia, unspecified: Secondary | ICD-10-CM | POA: Diagnosis not present

## 2023-07-28 DIAGNOSIS — Z79899 Other long term (current) drug therapy: Secondary | ICD-10-CM | POA: Insufficient documentation

## 2023-07-28 DIAGNOSIS — J45909 Unspecified asthma, uncomplicated: Secondary | ICD-10-CM | POA: Insufficient documentation

## 2023-07-28 DIAGNOSIS — Z191 Hormone sensitive malignancy status: Secondary | ICD-10-CM | POA: Diagnosis not present

## 2023-07-28 DIAGNOSIS — R5383 Other fatigue: Secondary | ICD-10-CM | POA: Insufficient documentation

## 2023-07-28 LAB — COMPREHENSIVE METABOLIC PANEL WITH GFR
ALT: 22 U/L (ref 0–44)
AST: 25 U/L (ref 15–41)
Albumin: 3.7 g/dL (ref 3.5–5.0)
Alkaline Phosphatase: 97 U/L (ref 38–126)
Anion gap: 5 (ref 5–15)
BUN: 12 mg/dL (ref 8–23)
CO2: 23 mmol/L (ref 22–32)
Calcium: 8.6 mg/dL — ABNORMAL LOW (ref 8.9–10.3)
Chloride: 104 mmol/L (ref 98–111)
Creatinine, Ser: 0.94 mg/dL (ref 0.61–1.24)
GFR, Estimated: >60 mL/min (ref >60)
Glucose, Bld: 111 mg/dL — ABNORMAL HIGH (ref 70–99)
Potassium: 3.6 mmol/L (ref 3.5–5.1)
Sodium: 132 mmol/L — ABNORMAL LOW (ref 135–145)
Total Bilirubin: 0.6 mg/dL (ref 0.0–1.2)
Total Protein: 6.4 g/dL — ABNORMAL LOW (ref 6.5–8.1)

## 2023-07-28 LAB — CBC WITH DIFFERENTIAL/PLATELET
Abs Immature Granulocytes: 0.03 10*3/uL (ref 0.00–0.07)
Basophils Absolute: 0 10*3/uL (ref 0.0–0.1)
Basophils Relative: 1 %
Eosinophils Absolute: 0.3 10*3/uL (ref 0.0–0.5)
Eosinophils Relative: 5 %
HCT: 40.6 % (ref 39.0–52.0)
Hemoglobin: 13.5 g/dL (ref 13.0–17.0)
Immature Granulocytes: 1 %
Lymphocytes Relative: 13 %
Lymphs Abs: 0.8 10*3/uL (ref 0.7–4.0)
MCH: 32.4 pg (ref 26.0–34.0)
MCHC: 33.3 g/dL (ref 30.0–36.0)
MCV: 97.4 fL (ref 80.0–100.0)
Monocytes Absolute: 0.6 10*3/uL (ref 0.1–1.0)
Monocytes Relative: 9 %
Neutro Abs: 4.7 10*3/uL (ref 1.7–7.7)
Neutrophils Relative %: 71 %
Platelets: 198 10*3/uL (ref 150–400)
RBC: 4.17 MIL/uL — ABNORMAL LOW (ref 4.22–5.81)
RDW: 12.8 % (ref 11.5–15.5)
WBC: 6.5 10*3/uL (ref 4.0–10.5)
nRBC: 0 % (ref 0.0–0.2)

## 2023-07-28 LAB — PSA: Prostatic Specific Antigen: 3.03 ng/mL (ref 0.00–4.00)

## 2023-07-28 NOTE — Progress Notes (Signed)
 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  I do not have the biopsy report from 7 years ago.  He received I-125 interstitial implant for the same.  His presenting PSA at that time was 8.7.  He was last given a dose of Eligard in March 2021 following which his PSA dropped down from 5.28-0.27.  He did not receive any Eligard since then and PSA went back up to 4.7   He has been referred to medical oncology for consideration of restarting ADT.   Lupron restarted in March 2022    Interval history- no recent falls. Lives alone. No recent hospitalizations  ECOG PS- 2 Pain scale- 4 Opioid associated constipation- no  Review of systems- Review of Systems  Constitutional:  Positive for malaise/fatigue. Negative for chills, fever and weight loss.  HENT:  Negative for congestion, ear discharge and nosebleeds.   Eyes:  Negative for blurred vision.  Respiratory:  Negative for cough, hemoptysis, sputum production, shortness of breath and wheezing.   Cardiovascular:  Negative for chest pain, palpitations, orthopnea and claudication.  Gastrointestinal:  Negative for abdominal pain, blood in stool, constipation, diarrhea, heartburn, melena, nausea and vomiting.  Genitourinary:  Negative for dysuria, flank pain, frequency, hematuria and urgency.  Musculoskeletal:  Negative for back pain,  joint pain and myalgias.  Skin:  Negative for rash.  Neurological:  Negative for dizziness, tingling, focal weakness, seizures, weakness and headaches.  Endo/Heme/Allergies:  Does not bruise/bleed easily.  Psychiatric/Behavioral:  Negative for depression and suicidal ideas. The patient does not have insomnia.       Allergies  Allergen Reactions   Augmentin [Amoxicillin-Pot Clavulanate]     Gi upset   Ciprofloxacin    Nsaids Other (See Comments)    Stomach ulcer    Rosuvastatin     Myalgias     Past Medical History:  Diagnosis Date   Allergy    trees and plants   Anemia    IDA   Anxiety    Asthma 1998   Diverticulitis    Elevated PSA    GERD (gastroesophageal reflux disease)    Hyperlipidemia    Hypothyroidism 03/16/2019   Prostate cancer [facility])      Past Surgical History:  Procedure Laterality Date   HERNIA REPAIR  1999   prostate cancer     TRANSURETHRAL RESECTION OF PROSTATE     VASECTOMY      Social History   Socioeconomic History   Marital status: Divorced    Spouse name: Not on file   Number of children: 1   Years of education: 12   Highest education level: 12th grade  Occupational History   Not on file  Tobacco Use   Smoking status: Former    Current packs/day: 0.00    Types: Cigarettes    Quit date: 04/22/1982    Years since quitting: 41.2    Passive exposure: Past  Smokeless tobacco: Never  Vaping Use   Vaping status: Never Used  Substance and Sexual Activity   Alcohol use: Not Currently   Drug use: Not Currently   Sexual activity: Not Currently    Partners: Female  Other Topics Concern   Not on file  Social History Narrative   Not on file   Social Drivers of Health   Financial Resource Strain: Low Risk  (07/16/2023)   Overall Financial Resource Strain (CARDIA)    Difficulty of Paying Living Expenses: Not hard at all  Food Insecurity: No Food Insecurity (07/23/2023)   Hunger Vital Sign    Worried About Running Out of Food in the  Last Year: Never true    Ran Out of Food in the Last Year: Never true  Transportation Needs: No Transportation Needs (07/23/2023)   PRAPARE - Administrator, Civil Service (Medical): No    Lack of Transportation (Non-Medical): No  Physical Activity: Inactive (05/15/2023)   Exercise Vital Sign    Days of Exercise per Week: 0 days    Minutes of Exercise per Session: 0 min  Stress: No Stress Concern Present (05/15/2023)   [employer] of Occupational Health - Occupational Stress Questionnaire    Feeling of Stress : Only a little  Social Connections: Moderately Integrated (07/16/2023)   Social Connection and Isolation Panel [NHANES]    Frequency of Communication with Friends and Family: Three times a week    Frequency of Social Gatherings with Friends and Family: Three times a week    Attends Religious Services: More than 4 times per year    Active Member of Clubs or Organizations: Yes    Attends Banker Meetings: Never    Marital Status: Divorced  Recent Concern: Social Connections - Moderately Isolated (05/15/2023)   Social Connection and Isolation Panel [NHANES]    Frequency of Communication with Friends and Family: More than three times a week    Frequency of Social Gatherings with Friends and Family: More than three times a week    Attends Religious Services: More than 4 times per year    Active Member of [employer] or Organizations: No    Attends Banker Meetings: Never    Marital Status: Divorced  Catering manager Violence: Not At Risk (07/23/2023)   Humiliation, Afraid, Rape, and Kick questionnaire    Fear of Current or Ex-Partner: No    Emotionally Abused: No    Physically Abused: No    Sexually Abused: No    Family History  Problem Relation Age of Onset   Heart disease Father    Hypertension Brother    Diabetes Brother      Current Outpatient Medications:    acetaminophen (TYLENOL) 500 MG tablet, Take 500 mg by mouth every 6 (six) hours  as needed., Disp: , Rfl:    albuterol (VENTOLIN HFA) 108 (90 Base) MCG/ACT inhaler, INHALE 2 PUFFS BY MOUTH EVERY 6 HOURS AS NEEDED FOR WHEEZING, Disp: 9 g, Rfl: 2   Ascorbic Acid (VITAMIN C) 100 MG tablet, Take 100 mg by mouth daily., Disp: , Rfl:    Budeson-Glycopyrrol-Formoterol (BREZTRI AEROSPHERE) 160-9-4.8 MCG/ACT AERO, Inhale 2 puffs into the lungs 2 (two) times daily., Disp: 32.1 g, Rfl: 4   co-enzyme Q-10 30 MG capsule, Take 30 mg by mouth daily., Disp: , Rfl:    ferrous sulfate 325 (65 FE) MG tablet, Take 325 mg by mouth daily with breakfast., Disp: , Rfl:    fexofenadine (ALLEGRA) 180 MG  tablet, Take 180 mg by mouth daily., Disp: , Rfl:    FIBER PO, Take 1 tablet by mouth daily., Disp: , Rfl:    fluticasone (FLONASE) 50 MCG/ACT nasal spray, Place 2 sprays into both nostrils daily., Disp: 16 g, Rfl: 4   levothyroxine (SYNTHROID) 50 MCG tablet, Take 1 1/2 tablets on M W Fri Sat Sun and take 1 on Tues and Thursday, Disp: 120 tablet, Rfl: 3   Melatonin 1 MG TABS, Take 5 mg by mouth at bedtime as needed., Disp: , Rfl:    Multiple Vitamins-Minerals (MULTIVITAMIN WITH MINERALS) tablet, Take 1 tablet by mouth daily., Disp: , Rfl:    Omega-3 Fatty Acids (FISH OIL) 1000 MG CAPS, Take 1 capsule by mouth daily., Disp: , Rfl:    Red Yeast Rice Extract 300 MG CAPS, Take 1 tablet by mouth daily., Disp: , Rfl:    sertraline (ZOLOFT) 100 MG tablet, Take 2 tablets (200 mg total) by mouth daily., Disp: 180 tablet, Rfl: 1 No current facility-administered medications for this visit.  Facility-Administered Medications Ordered in Other Visits:    leuprolide (6 Month) (ELIGARD) injection 45 mg, 45 mg, Subcutaneous, Q6 months, Creig Hines, MD, 45 mg at 01/11/22 1206   leuprolide (6 Month) (ELIGARD) injection 45 mg, 45 mg, Subcutaneous, Q6 months, Creig Hines, MD, 45 mg at 07/12/22 1051  Physical exam:  Vitals:   07/28/23 1046  BP: 126/67  Pulse: 94  Resp: 19  Temp: (!) 96.6 F (35.9 C)  TempSrc:  Tympanic  SpO2: 99%  Weight: 169 lb (76.7 kg)  Height: 5\' 8"  (1.727 m)   Physical Exam Cardiovascular:     Rate and Rhythm: Normal rate and regular rhythm.     Heart sounds: Normal heart sounds.  Pulmonary:     Effort: Pulmonary effort is normal.     Breath sounds: Normal breath sounds.  Abdominal:     General: Bowel sounds are normal.     Palpations: Abdomen is soft.  Skin:    General: Skin is warm and dry.  Neurological:     Mental Status: He is alert and oriented to person, place, and time.     I have personally reviewed labs listed below:    Latest Ref Rng & Units 07/28/2023   10:01 AM  CMP  Glucose 70 - 99 mg/dL 161   BUN 8 - 23 mg/dL 12   Creatinine 0.96 - 1.24 mg/dL 0.45   Sodium 409 - 811 mmol/L 132   Potassium 3.5 - 5.1 mmol/L 3.6   Chloride 98 - 111 mmol/L 104   CO2 22 - 32 mmol/L 23   Calcium 8.9 - 10.3 mg/dL 8.6   Total Protein 6.5 - 8.1 g/dL 6.4   Total Bilirubin 0.0 - 1.2 mg/dL 0.6   Alkaline Phos 38 - 126 U/L 97   AST 15 - 41 U/L 25   ALT 0 - 44 U/L 22       Latest Ref Rng & Units 07/28/2023   10:01 AM  CBC  WBC 4.0 - 10.5 K/uL 6.5   Hemoglobin 13.0 - 17.0 g/dL 91.4   Hematocrit 78.2 - 52.0 % 40.6   Platelets 150 - 400 K/uL 198     Assessment and plan- Patient is a 78 y.o. male with nonmetastatic castrate sensitive prostate cancer s/p 2 years of ADT. He is not presently on ADT and here for routine f/u of prostate cancer  Patient has a history of nonmetastatic castrate sensitive prostate cancer  and has been on ADT for about a year now.  His Last PSA from January 2025 was at 0.43 higher as compared to 0.18 in September 2024.  PSA from today is pending.  There is a consistent increase in his PSA I will consider restarting his ADT at that time.  CBC with differential CMP PSA in 3 months in 6 months and I will see him back in 6 months   Visit Diagnosis 1. Prostate cancer [facility])      Dr. Owens Shark, MD, MPH [facility] at [facility] [phone] 07/28/2023 11:32 AM

## 2023-07-29 ENCOUNTER — Telehealth: Payer: Self-pay | Admitting: *Deleted

## 2023-07-29 ENCOUNTER — Telehealth: Payer: Self-pay

## 2023-07-29 NOTE — Telephone Encounter (Signed)
 Patient called to get the appt date and time. He does not do the other way- I assume my chart. I told him the appt this  Friday 4/11 at 9 am. He says he will get there.

## 2023-07-29 NOTE — Telephone Encounter (Signed)
 Per Dr. Smith Robert "He needs to restart eligard asap. Please bring him for eligard this week no labs and he will also get eligard in 6 months when I see him".  Outbound call to patient; informed of above.  Patient has no additional questions / concerns at this time.

## 2023-07-29 NOTE — Telephone Encounter (Signed)
-----   Message from Creig Hines sent at 07/29/2023  8:38 AM EDT ----- He needs to restart eligard asap. Please bring him for eligard this week no labs and he will also get eligard in 6 months when I see him

## 2023-07-29 NOTE — Telephone Encounter (Signed)
 Pt called to schedule his appt, injection scheduled for Thursday 07/31/23 and injection added to appts in 6 months

## 2023-07-31 ENCOUNTER — Ambulatory Visit

## 2023-08-01 ENCOUNTER — Inpatient Hospital Stay

## 2023-08-01 DIAGNOSIS — C61 Malignant neoplasm of prostate: Secondary | ICD-10-CM

## 2023-08-01 MED ORDER — LEUPROLIDE ACETATE (6 MONTH) 45 MG ~~LOC~~ KIT
45.0000 mg | PACK | SUBCUTANEOUS | Status: DC
  Administered 2023-08-01: 45 mg via SUBCUTANEOUS
  Filled 2023-08-01: qty 45

## 2023-08-06 ENCOUNTER — Ambulatory Visit: Payer: Self-pay

## 2023-08-06 NOTE — Patient Outreach (Signed)
 Complex Care Management   Visit Note  08/06/2023  Name:  Thomas [name] [name] DOB: 04-Nov-1945  Situation: Referral received for Complex Care Management related to SDOH Barriers:  Financial Resource Strain I obtained verbal consent from Patient.  Visit completed with patient  on the phone  Background:   Past Medical History:  Diagnosis Date   Allergy    trees and plants   Anemia    IDA   Anxiety    Asthma 1998   Diverticulitis    Elevated PSA    GERD (gastroesophageal reflux disease)    Hyperlipidemia    Hypothyroidism 03/16/2019   Prostate cancer [facility])     Assessment: Patient Reported Symptoms:  Cognitive        Neurological      HEENT        Cardiovascular      Respiratory      Endocrine      Gastrointestinal        Genitourinary      Integumentary      Musculoskeletal     Falls in the past year?: Yes Number of falls in past year: 1 or less Was there an injury with Fall?: No Fall Risk Category Calculator: 1 Patient Fall Risk Level: Low Fall Risk Patient at Risk for Falls Due to: Impaired balance/gait, Impaired mobility  Psychosocial       Quality of Family Relationships: helpful      05/15/2023    8:28 AM  Depression screen PHQ 2/9  Decreased Interest 1  Down, Depressed, Hopeless 0  PHQ - 2 Score 1    There were no vitals filed for this visit.  Medications Reviewed Today   Medications were not reviewed in this encounter     Recommendation:   Patient has nephew visit bi-weekly to review bills and manage budget.  Patient drives and is able to run errands on his own.  Patient agreed to schedule and eye exam soon.  Follow Up Plan:   Patient has met all care management goals. Care Management case will be closed. Patient has been provided contact information should new needs arise.    Dallis Dues, BSW [place]  [facility], [facility] Social Worker Direct Dial: [phone]  Fax:  [phone] Website: [email or web address]

## 2023-08-06 NOTE — Patient Instructions (Signed)
 Visit Information  Thank you for taking time to visit with me today. Please don't hesitate to contact me if I can be of assistance to you.   Following are the goals we discussed today:   Goals Addressed             This Visit's Progress    VBCI Social Work Care Plan       Problems:   Financial Strain   CSW Clinical Goal(s):   Over the next 1 year the Patient will  working with family to manage budget .  Interventions:  Social Determinants of Health in Patient with  Cancer : SDOH assessments completed: Financial Strain  Evaluation of current treatment plan related to unmet needs    Patient Goals/Self-Care Activities:  Patient will continue to work with family to manage budget.  Plan:   No further follow up required: Goal met.  No follow up is requested by patient.        If you are experiencing a Mental Health or Behavioral Health Crisis or need someone to talk to, please call 911  Patient verbalizes understanding of instructions and care plan provided today and agrees to view in MyChart. Active MyChart status and patient understanding of how to access instructions and care plan via MyChart confirmed with patient.     No further follow up required: Goal achieved, no follow up requested by patient.  Dallis Dues, BSW   [facility], [facility] Social Worker Direct Dial: [phone]  Fax: [phone] Website: [email or web address]

## 2023-08-12 ENCOUNTER — Ambulatory Visit (INDEPENDENT_AMBULATORY_CARE_PROVIDER_SITE_OTHER): Payer: PPO | Admitting: Family Medicine

## 2023-08-12 ENCOUNTER — Encounter: Payer: Self-pay | Admitting: Oncology

## 2023-08-12 VITALS — BP 122/74 | HR 85 | Temp 97.7°F | Ht 68.0 in | Wt 166.0 lb

## 2023-08-12 DIAGNOSIS — R2689 Other abnormalities of gait and mobility: Secondary | ICD-10-CM

## 2023-08-12 DIAGNOSIS — C61 Malignant neoplasm of prostate: Secondary | ICD-10-CM | POA: Diagnosis not present

## 2023-08-12 DIAGNOSIS — F324 Major depressive disorder, single episode, in partial remission: Secondary | ICD-10-CM | POA: Diagnosis not present

## 2023-08-12 MED ORDER — SERTRALINE HCL 100 MG PO TABS: 200.0000 mg | ORAL_TABLET | Freq: Every day | ORAL | 1 refills | Status: AC

## 2023-08-12 NOTE — Progress Notes (Signed)
   Subjective:    Patient ID: Thomas [name], male    DOB: 08/05/1945, 78 y.o.   MRN: [id number]  HPI Follow up for chronic conditions  Discussed the use of AI scribe software for clinical note transcription with the patient, who gave verbal consent to proceed.  History of Present Illness   Thomas [name] is a 78 year old male with a history of cancer who presents for follow-up regarding his cancer treatment.  He recently experienced symptoms resembling COVID-19, which have since resolved, indicating a possible viral illness or cold. His cancer treatment continues with infusions, and he feels it is progressing well.  He has noticed a decrease in appetite, leading to some weight loss. His PSA count had decreased but recently increased, prompting a restart of Eligard  treatment on April 11th to manage PSA levels.  He manages his medications by organizing them in a pill box daily. He acknowledges some memory challenges, stating his mind does not work like it used to, but he compensates by writing things down and using sticky notes.  Socially, he remains active, doing his own grocery shopping and cooking meals. He occasionally eats out with a friend whose wife passed away four months ago, providing companionship for both. He continues to engage with his church community and uses [employer] for his medications.       Review of Systems     Objective:   Physical Exam General-in no acute distress Eyes-no discharge Lungs-respiratory rate normal, CTA CV-no murmurs,RRR Extremities skin warm dry no edema Neuro grossly normal Behavior normal, alert        Assessment & Plan:  Assessment and Plan    Prostate cancer Managed with oncologist. Recent PSA increase led to Eligard  restart. Infusion given April 11. Follow-up PSA in July. Regular monitoring with Dr. Magali Schmitz. - Provide printout of upcoming appointments and blood work schedule. - Schedule blood work in July and October to  monitor PSA levels. - Schedule follow-up visit with oncologist in October.  Weight loss and loss of appetite Reports decreased appetite and weight loss. No significant impact on health or daily activities. - Encourage balanced diet and regular meals.  Wellness Visit Routine visit for 78 year old male. No acute concerns. Blood pressure controlled. General health stable. Engages in social activities and maintains independence. - Schedule follow-up visit in September or October for routine check-up. - Monitor cholesterol and thyroid  levels later in the year.     1. Poor balance (Primary) Patient uses cane we did discuss practicing walking  2. Depression, major, single episode, in partial remission (HCC) Continue medications Patient does connect with friends once or twice a week Not suicidal   3. Prostate cancer [facility]) Under the care of oncology for this  Patient relates he does his thyroid  medicine regular basis Puts his pills in a pillbox so he can stay accurate with it More comprehensive lab work before his follow-up visit later this year  Lab work reviewed no need to do additional lab work currently

## 2023-08-28 ENCOUNTER — Telehealth: Payer: Self-pay | Admitting: *Deleted

## 2023-08-28 NOTE — Patient Outreach (Signed)
 Noted patient case with program start date of 08/12/23 This patient has been closed by RN CM on 07/16/23 as he requested not further calls were needed  Outreach to pcp staff to see if patient had made any improvements and asked staff to see if pcp would assist patient with orders for the guide program or referral to neurologist Updated new pcp office RN CCM of this case   Claus Silvestro L. Mcarthur Speedy, RN, BSN, CCM [place]  [facility], [facility] RN Care Manager Direct Dial: [phone]  Fax: [phone]

## 2023-09-01 NOTE — Progress Notes (Signed)
 Pharmacy Medication Assistance Program Note   09/01/2023  Patient ID: Thomas [name], male   DOB: 09/30/1945, 78 y.o.   [name]     06/10/2023  Outreach [employer]  Manufacturer [employer] Nurse, adult Drugs [facility]  Type of Radiographer, therapeutic Assistance  Date Application Sent to Prescriber 06/10/2023  Patient Assistance Determination Approved  Approval End Date 04/21/2024

## 2023-09-03 ENCOUNTER — Ambulatory Visit: Payer: Self-pay | Admitting: Family Medicine

## 2023-09-16 ENCOUNTER — Encounter: Payer: Self-pay | Admitting: Physician Assistant

## 2023-09-16 ENCOUNTER — Ambulatory Visit (INDEPENDENT_AMBULATORY_CARE_PROVIDER_SITE_OTHER): Admitting: Physician Assistant

## 2023-09-16 VITALS — BP 139/78 | HR 102 | Temp 98.1°F | Ht 68.0 in | Wt 160.0 lb

## 2023-09-16 DIAGNOSIS — R1084 Generalized abdominal pain: Secondary | ICD-10-CM | POA: Diagnosis not present

## 2023-09-16 DIAGNOSIS — Z711 Person with feared health complaint in whom no diagnosis is made: Secondary | ICD-10-CM | POA: Insufficient documentation

## 2023-09-16 DIAGNOSIS — W1800XA Striking against unspecified object with subsequent fall, initial encounter: Secondary | ICD-10-CM | POA: Insufficient documentation

## 2023-09-16 NOTE — Progress Notes (Signed)
 Acute Office Visit  Subjective:     Patient ID: Thomas [name], male    DOB: 20-Nov-1945, 78 y.o.   [name]   Patient presents today with concerns of abdominal pain following a fall. He relates a fall at home approximately 1 week ago. He is unable to recalls where or how he fell. He endorses hitting his eye on the corner of an object. He reports tenderness over his abdomen the last few days, but states resolution today. He denies diarrhea, constipation, nausea, or vomiting. He also voices concerns for memory today. He states 1 episode of driving to [place] and forgetting where he was going. Ultimately he was able to make it to his destination and back home safely. He lives alone but has frequent help around the house. He is able to complete ADLs such as bathe and prepare meals.     Review of Systems  Constitutional:  Negative for fever, malaise/fatigue and weight loss.  Gastrointestinal:  Negative for abdominal pain, constipation, diarrhea, nausea and vomiting.  Musculoskeletal:  Positive for falls. Negative for back pain and joint pain.  Neurological:  Negative for dizziness, sensory change, weakness and headaches.        Objective:     BP 139/78   Pulse (!) 102   Temp 98.1 F (36.7 C)   Ht 5\' 8"  (1.727 m)   Wt 160 lb (72.6 kg)   SpO2 98%   BMI 24.33 kg/m   Physical Exam Constitutional:      Appearance: Normal appearance. He is normal weight.  HENT:     Head: Normocephalic. Abrasion (under right eye) and contusion (under right eye) present.     Nose: Nose normal.     Mouth/Throat:     Mouth: Mucous membranes are moist.     Pharynx: Oropharynx is clear.  Eyes:     Extraocular Movements: Extraocular movements intact.     Conjunctiva/sclera: Conjunctivae normal.  Cardiovascular:     Rate and Rhythm: Regular rhythm. Tachycardia present.     Heart sounds: Normal heart sounds. No murmur heard. Pulmonary:     Effort: Pulmonary effort is normal.     Breath  sounds: Normal breath sounds.  Abdominal:     General: Abdomen is flat. Bowel sounds are normal. There is no distension.     Palpations: Abdomen is soft.     Tenderness: There is no abdominal tenderness. There is no guarding or rebound.  Neurological:     General: No focal deficit present.     Mental Status: He is alert and oriented to person, place, and time.  Psychiatric:        Attention and Perception: Attention normal.        Cognition and Memory: Cognition and memory normal.     No results found for any visits on 09/16/23.      Assessment & Plan:  Fall against object Assessment & Plan: Patient presents today following a fall approximately 1 week ago. Patient stable today. He does have one area of ecchymosis under his right eye with 2 related abrasions. He denies losing consciousness. No neurologic signs on exam today. Normal gait. We discussed using cane for safety, as well as discussed fall prevention in the home. Patient advised to alert family if falls result in a head injury so he can be evaluated in the ER.    Concern about memory Assessment & Plan: Patient presents today with concerns for his memory. MMSE 26/30. Reassurance given today. Discussed  normal aging vs reasons for concern. Patient advised to follow up for worsening symptoms, such as getting lost while driving, misplacing frequently used items, unable to complete a meal, etc. He voices understanding. Pateitn scheduled to follow up with Dr. Geralyn Knee in August.     Return for previously schedueld appt with Dr. Geralyn Knee.  Jearlean Mince Averi Kilty, PA-C

## 2023-09-16 NOTE — Assessment & Plan Note (Signed)
 Patient presents today following a fall approximately 1 week ago. Patient stable today. He does have one area of ecchymosis under his right eye with 2 related abrasions. He denies losing consciousness. No neurologic signs on exam today. Normal gait. We discussed using cane for safety, as well as discussed fall prevention in the home. Patient advised to alert family if falls result in a head injury so he can be evaluated in the ER.

## 2023-09-16 NOTE — Assessment & Plan Note (Signed)
 Patient presents today with concerns for his memory. MMSE 26/30. Reassurance given today. Discussed normal aging vs reasons for concern. Patient advised to follow up for worsening symptoms, such as getting lost while driving, misplacing frequently used items, unable to complete a meal, etc. He voices understanding. Pateitn scheduled to follow up with Dr. Geralyn Knee in August.

## 2023-10-27 ENCOUNTER — Inpatient Hospital Stay: Attending: Oncology

## 2023-10-27 DIAGNOSIS — C61 Malignant neoplasm of prostate: Secondary | ICD-10-CM | POA: Insufficient documentation

## 2023-10-27 LAB — CBC WITH DIFFERENTIAL (CANCER CENTER ONLY)
Abs Immature Granulocytes: 0.02 K/uL (ref 0.00–0.07)
Basophils Absolute: 0 K/uL (ref 0.0–0.1)
Basophils Relative: 1 %
Eosinophils Absolute: 0.1 K/uL (ref 0.0–0.5)
Eosinophils Relative: 2 %
HCT: 38.4 % — ABNORMAL LOW (ref 39.0–52.0)
Hemoglobin: 13.1 g/dL (ref 13.0–17.0)
Immature Granulocytes: 0 %
Lymphocytes Relative: 14 %
Lymphs Abs: 0.8 K/uL (ref 0.7–4.0)
MCH: 32.8 pg (ref 26.0–34.0)
MCHC: 34.1 g/dL (ref 30.0–36.0)
MCV: 96 fL (ref 80.0–100.0)
Monocytes Absolute: 0.6 K/uL (ref 0.1–1.0)
Monocytes Relative: 12 %
Neutro Abs: 3.7 K/uL (ref 1.7–7.7)
Neutrophils Relative %: 71 %
Platelet Count: 152 K/uL (ref 150–400)
RBC: 4 MIL/uL — ABNORMAL LOW (ref 4.22–5.81)
RDW: 14 % (ref 11.5–15.5)
WBC Count: 5.2 K/uL (ref 4.0–10.5)
nRBC: 0 % (ref 0.0–0.2)

## 2023-10-27 LAB — CMP (CANCER CENTER ONLY)
ALT: 21 U/L (ref 0–44)
AST: 32 U/L (ref 15–41)
Albumin: 4.1 g/dL (ref 3.5–5.0)
Alkaline Phosphatase: 118 U/L (ref 38–126)
Anion gap: 10 (ref 5–15)
BUN: 14 mg/dL (ref 8–23)
CO2: 25 mmol/L (ref 22–32)
Calcium: 9 mg/dL (ref 8.9–10.3)
Chloride: 101 mmol/L (ref 98–111)
Creatinine: 1.12 mg/dL (ref 0.61–1.24)
GFR, Estimated: >60 mL/min (ref >60)
Glucose, Bld: 113 mg/dL — ABNORMAL HIGH (ref 70–99)
Potassium: 3.6 mmol/L (ref 3.5–5.1)
Sodium: 136 mmol/L (ref 135–145)
Total Bilirubin: 1.4 mg/dL — ABNORMAL HIGH (ref 0.0–1.2)
Total Protein: 6.8 g/dL (ref 6.5–8.1)

## 2023-10-27 LAB — PSA: Prostatic Specific Antigen: 0.67 ng/mL (ref 0.00–4.00)

## 2023-11-07 ENCOUNTER — Telehealth: Payer: Self-pay

## 2023-11-07 NOTE — Telephone Encounter (Signed)
 Copied from CRM [id number]. Topic: General - Transportation >> Nov 07, 2023 12:11 PM Selinda RAMAN wrote: Reason for CRM: Daphne Willim Cap with [facility] [employer] would like a call back as soon as possible from a nurse or provider to discuss conditions for the patient. Please assist further by calling [phone]

## 2023-11-11 ENCOUNTER — Emergency Department (HOSPITAL_COMMUNITY)

## 2023-11-11 ENCOUNTER — Emergency Department (HOSPITAL_COMMUNITY)
Admission: EM | Admit: 2023-11-11 | Discharge: 2023-11-14 | Disposition: A | Discharge status: Other Acute Inpatient Hospital | Attending: Emergency Medicine | Admitting: Emergency Medicine

## 2023-11-11 ENCOUNTER — Encounter (HOSPITAL_COMMUNITY): Payer: Self-pay | Admitting: Emergency Medicine

## 2023-11-11 ENCOUNTER — Telehealth: Payer: Self-pay

## 2023-11-11 ENCOUNTER — Ambulatory Visit: Admitting: Nurse Practitioner

## 2023-11-11 ENCOUNTER — Other Ambulatory Visit: Payer: Self-pay

## 2023-11-11 DIAGNOSIS — E876 Hypokalemia: Secondary | ICD-10-CM | POA: Diagnosis not present

## 2023-11-11 DIAGNOSIS — Z79899 Other long term (current) drug therapy: Secondary | ICD-10-CM | POA: Insufficient documentation

## 2023-11-11 DIAGNOSIS — F10920 Alcohol use, unspecified with intoxication, uncomplicated: Secondary | ICD-10-CM

## 2023-11-11 DIAGNOSIS — F10129 Alcohol abuse with intoxication, unspecified: Secondary | ICD-10-CM | POA: Diagnosis not present

## 2023-11-11 DIAGNOSIS — F10120 Alcohol abuse with intoxication, uncomplicated: Secondary | ICD-10-CM | POA: Diagnosis not present

## 2023-11-11 DIAGNOSIS — M47812 Spondylosis without myelopathy or radiculopathy, cervical region: Secondary | ICD-10-CM | POA: Diagnosis not present

## 2023-11-11 DIAGNOSIS — R2689 Other abnormalities of gait and mobility: Secondary | ICD-10-CM | POA: Diagnosis not present

## 2023-11-11 DIAGNOSIS — Z8546 Personal history of malignant neoplasm of prostate: Secondary | ICD-10-CM | POA: Insufficient documentation

## 2023-11-11 DIAGNOSIS — S0990XA Unspecified injury of head, initial encounter: Secondary | ICD-10-CM | POA: Diagnosis not present

## 2023-11-11 DIAGNOSIS — M47811 Spondylosis without myelopathy or radiculopathy, occipito-atlanto-axial region: Secondary | ICD-10-CM | POA: Diagnosis not present

## 2023-11-11 DIAGNOSIS — Y908 Blood alcohol level of 240 mg/100 ml or more: Secondary | ICD-10-CM | POA: Diagnosis not present

## 2023-11-11 DIAGNOSIS — R9431 Abnormal electrocardiogram [ECG] [EKG]: Secondary | ICD-10-CM | POA: Diagnosis not present

## 2023-11-11 DIAGNOSIS — R4182 Altered mental status, unspecified: Secondary | ICD-10-CM | POA: Diagnosis present

## 2023-11-11 DIAGNOSIS — S199XXA Unspecified injury of neck, initial encounter: Secondary | ICD-10-CM | POA: Diagnosis not present

## 2023-11-11 LAB — URINALYSIS, ROUTINE W REFLEX MICROSCOPIC
Bilirubin Urine: NEGATIVE
Glucose, UA: NEGATIVE mg/dL
Hgb urine dipstick: NEGATIVE
Ketones, ur: NEGATIVE mg/dL
Leukocytes,Ua: NEGATIVE
Nitrite: NEGATIVE
Protein, ur: NEGATIVE mg/dL
Specific Gravity, Urine: 1.002 — ABNORMAL LOW (ref 1.005–1.030)
pH: 7 (ref 5.0–8.0)

## 2023-11-11 LAB — COMPREHENSIVE METABOLIC PANEL WITH GFR
ALT: 27 U/L (ref 0–44)
AST: 40 U/L (ref 15–41)
Albumin: 3.5 g/dL (ref 3.5–5.0)
Alkaline Phosphatase: 106 U/L (ref 38–126)
Anion gap: 12 (ref 5–15)
BUN: 11 mg/dL (ref 8–23)
CO2: 25 mmol/L (ref 22–32)
Calcium: 8.1 mg/dL — ABNORMAL LOW (ref 8.9–10.3)
Chloride: 109 mmol/L (ref 98–111)
Creatinine, Ser: 0.74 mg/dL (ref 0.61–1.24)
GFR, Estimated: >60 mL/min (ref >60)
Glucose, Bld: 97 mg/dL (ref 70–99)
Potassium: 2.6 mmol/L — CL (ref 3.5–5.1)
Sodium: 146 mmol/L — ABNORMAL HIGH (ref 135–145)
Total Bilirubin: 0.4 mg/dL (ref 0.0–1.2)
Total Protein: 6.3 g/dL — ABNORMAL LOW (ref 6.5–8.1)

## 2023-11-11 LAB — CBC
HCT: 36.7 % — ABNORMAL LOW (ref 39.0–52.0)
Hemoglobin: 12.6 g/dL — ABNORMAL LOW (ref 13.0–17.0)
MCH: 34.7 pg — ABNORMAL HIGH (ref 26.0–34.0)
MCHC: 34.3 g/dL (ref 30.0–36.0)
MCV: 101.1 fL — ABNORMAL HIGH (ref 80.0–100.0)
Platelets: 193 K/uL (ref 150–400)
RBC: 3.63 MIL/uL — ABNORMAL LOW (ref 4.22–5.81)
RDW: 14.7 % (ref 11.5–15.5)
WBC: 6.4 K/uL (ref 4.0–10.5)
nRBC: 0 % (ref 0.0–0.2)

## 2023-11-11 LAB — TSH: TSH: 0.295 u[IU]/mL — ABNORMAL LOW (ref 0.350–4.500)

## 2023-11-11 LAB — ETHANOL: Alcohol, Ethyl (B): 280 mg/dL — ABNORMAL HIGH (ref <15)

## 2023-11-11 MED ORDER — ONDANSETRON HCL 4 MG PO TABS
4.0000 mg | ORAL_TABLET | Freq: Three times a day (TID) | ORAL | Status: DC | PRN
Start: 2023-11-11 — End: 2023-11-14

## 2023-11-11 MED ORDER — ALBUTEROL SULFATE HFA 108 (90 BASE) MCG/ACT IN AERS: 2.0000 | INHALATION_SPRAY | Freq: Four times a day (QID) | RESPIRATORY_TRACT | Status: DC | PRN

## 2023-11-11 MED ORDER — POTASSIUM CHLORIDE CRYS ER 20 MEQ PO TBCR
40.0000 meq | EXTENDED_RELEASE_TABLET | Freq: Once | ORAL | Status: AC
  Administered 2023-11-11: 40 meq via ORAL
  Filled 2023-11-11: qty 2

## 2023-11-11 MED ORDER — THIAMINE HCL 100 MG/ML IJ SOLN: 100.0000 mg | Freq: Once | INTRAMUSCULAR | Status: DC

## 2023-11-11 MED ORDER — LACTATED RINGERS IV BOLUS
1000.0000 mL | Freq: Once | INTRAVENOUS | Status: AC
  Administered 2023-11-11: 1000 mL via INTRAVENOUS

## 2023-11-11 MED ORDER — LORAZEPAM 1 MG PO TABS
1.0000 mg | ORAL_TABLET | Freq: Four times a day (QID) | ORAL | Status: DC | PRN
Start: 2023-11-11 — End: 2023-11-14
  Administered 2023-11-11 – 2023-11-12 (×2): 1 mg via ORAL
  Filled 2023-11-11 (×2): qty 1

## 2023-11-11 MED ORDER — ACETAMINOPHEN 325 MG PO TABS
650.0000 mg | ORAL_TABLET | ORAL | Status: DC | PRN
  Administered 2023-11-13: 650 mg via ORAL
  Filled 2023-11-11: qty 2

## 2023-11-11 MED ORDER — ALUM & MAG HYDROXIDE-SIMETH 200-200-20 MG/5ML PO SUSP: 30.0000 mL | Freq: Four times a day (QID) | ORAL | Status: DC | PRN

## 2023-11-11 MED ORDER — HYDROXYZINE HCL 25 MG PO TABS
25.0000 mg | ORAL_TABLET | Freq: Four times a day (QID) | ORAL | Status: DC | PRN
Start: 2023-11-11 — End: 2023-11-14
  Administered 2023-11-12: 25 mg via ORAL
  Filled 2023-11-11: qty 1

## 2023-11-11 MED ORDER — SERTRALINE HCL 50 MG PO TABS
200.0000 mg | ORAL_TABLET | Freq: Every day | ORAL | Status: DC
  Administered 2023-11-11 – 2023-11-14 (×4): 200 mg via ORAL
  Filled 2023-11-11 (×4): qty 4

## 2023-11-11 MED ORDER — MAGNESIUM SULFATE 2 GM/50ML IV SOLN
2.0000 g | Freq: Once | INTRAVENOUS | Status: AC
  Administered 2023-11-11: 2 g via INTRAVENOUS
  Filled 2023-11-11: qty 50

## 2023-11-11 MED ORDER — POTASSIUM CHLORIDE 10 MEQ/100ML IV SOLN
10.0000 meq | INTRAVENOUS | Status: AC
  Administered 2023-11-11 (×2): 10 meq via INTRAVENOUS
  Filled 2023-11-11 (×2): qty 100

## 2023-11-11 MED ORDER — LOPERAMIDE HCL 2 MG PO CAPS: 2.0000 mg | ORAL_CAPSULE | ORAL | Status: DC | PRN

## 2023-11-11 MED ORDER — THIAMINE MONONITRATE 100 MG PO TABS
100.0000 mg | ORAL_TABLET | Freq: Every day | ORAL | Status: DC
  Administered 2023-11-12 – 2023-11-14 (×3): 100 mg via ORAL
  Filled 2023-11-11 (×3): qty 1

## 2023-11-11 MED ORDER — BUDESON-GLYCOPYRROL-FORMOTEROL 160-9-4.8 MCG/ACT IN AERO
2.0000 | INHALATION_SPRAY | Freq: Two times a day (BID) | RESPIRATORY_TRACT | Status: DC
  Administered 2023-11-12 – 2023-11-14 (×5): 2 via RESPIRATORY_TRACT
  Filled 2023-11-11 (×2): qty 5.9

## 2023-11-11 MED ORDER — ACETAMINOPHEN 500 MG PO TABS: 500.0000 mg | ORAL_TABLET | Freq: Four times a day (QID) | ORAL | Status: DC | PRN

## 2023-11-11 MED ORDER — ADULT MULTIVITAMIN W/MINERALS CH
1.0000 | ORAL_TABLET | Freq: Every day | ORAL | Status: DC
  Administered 2023-11-11 – 2023-11-14 (×4): 1 via ORAL
  Filled 2023-11-11 (×4): qty 1

## 2023-11-11 NOTE — ED Triage Notes (Signed)
 Pt presented to ED via [facility]. They were called out by family. Found outside by nephew on concrete porch. Pt was last heard from last night at 1800. Pt Covered in ants upon arrival to ED with clear alteration of mentation.  Pt has history of depression and is prescribed zoloft . Pt was laying against glass screen door with post-its placed on door with phrases like Completely lost it Back on depression. Pt wrote a long note and taped it to door. Note was not capable of being read due to penmanship. Pt found next to 2 gal size whiskey bottles with both half empty. EMS state alcohol was smelt on breath. Pt altered and unable to answer questions. Only alert to self. EMS place C-collar on pt due to being found outside. EMS placed 20 GA IV in L forearm.

## 2023-11-11 NOTE — ED Notes (Signed)
 TOC has acknowledged consult for SNF placement. CSW attempted to reach family and called all three contacts on facesheet, no response. TOC to follow.

## 2023-11-11 NOTE — ED Notes (Addendum)
 Pt out of bed tangled in monitoring cords. Pt pulled out IV in the process. Bleeding controlled. Pt cleaned and given new purple scrubs. Monitoring cords removed for pt safety. Pt back resting in bed. Pt denies needing anything at this time. Bed alarm back on. Will continue to monitor

## 2023-11-11 NOTE — ED Notes (Signed)
 Pt assisted with self bathing. Pt was provided needed items for bathing and proceeded to bathe self in room with assistance of NT and RN. Pt tolerated well and was capable of preforming task.

## 2023-11-11 NOTE — ED Notes (Signed)
 Patient transported to CT

## 2023-11-11 NOTE — ED Notes (Signed)
C-Collar removed per MD approval

## 2023-11-11 NOTE — ED Notes (Signed)
Pt incontinent of urine. Full linen change performed  

## 2023-11-11 NOTE — Telephone Encounter (Signed)
 Communication  Patient/patient representative is calling for information regarding an appointment. Patient is going to [facility], nephew in law called/ [facility] to inform us  that is why he missed his appt.

## 2023-11-11 NOTE — ED Provider Notes (Signed)
 [place] EMERGENCY DEPARTMENT AT [facility] Provider Note   CSN: [id number] Arrival date & time: 11/11/23  1056     Patient presents with: Altered Mental Status   Thomas [name] is a 78 y.o. male.   HPI     78 year old male comes into the emergency room with chief complaint of altered mental status.  Patient has remote history of prostate cancer, depression.  He is brought here by EMS.  According to the EMS, patient's nephew had called the patient this morning.  There was no response.  When the nephew went to check on the patient, patient was found on his front porch, on concrete, laying flat and unresponsive.  He had ants crawling all over him.  His blood sugar was normal.  They brought him to the ER.  EMS also noted that patient had posted signs on his house and door that indicated things like  have my wife call my number,  my depression is getting worse, leave me alone and then another rather long note was also sticking on the door.  EMS also noted 2 large whiskey bottles, one half empty and the other full.  Patient admits to alcohol use.  He states that he drank all day yesterday.  Currently he has no pain and denies any headache, neck pain, weakness, numbness.   Patient's nephew is at the bedside.  He actually is husband of patient's niece.  Patient has a daughter who lives in [place] .  He thinks that she has some kind of POA.  She was getting concerned about his wellbeing and had called social services last week, and social services were made aware of this ER visit as well.     Prior to Admission medications   Medication Sig Start Date End Date Taking? Authorizing Provider  acetaminophen  (TYLENOL ) 500 MG tablet Take 500 mg by mouth every 6 (six) hours as needed.    [provider]  albuterol  (VENTOLIN  HFA) 108 (90 Base) MCG/ACT inhaler INHALE 2 PUFFS BY MOUTH EVERY 6 HOURS AS NEEDED FOR WHEEZING 08/06/22   Alphonsa Glendia LABOR, MD  Ascorbic Acid  (VITAMIN C) 100 MG tablet Take 100 mg by mouth daily.    [provider]  Budeson-Glycopyrrol-Formoterol  (BREZTRI  AEROSPHERE) 160-9-4.8 MCG/ACT AERO Inhale 2 puffs into the lungs 2 (two) times daily. 06/12/23   Alphonsa Glendia LABOR, MD  co-enzyme Q-10 30 MG capsule Take 30 mg by mouth daily.    [provider]  ferrous sulfate  325 (65 FE) MG tablet Take 325 mg by mouth daily with breakfast.    [provider]  fexofenadine (ALLEGRA) 180 MG tablet Take 180 mg by mouth daily.    [provider]  FIBER PO Take 1 tablet by mouth daily.    [provider]  fluticasone  (FLONASE ) 50 MCG/ACT nasal spray Place 2 sprays into both nostrils daily. 08/22/21   Alphonsa Glendia LABOR, MD  levothyroxine  (SYNTHROID ) 50 MCG tablet Take 1 1/2 tablets on M W Fri Sat Sun and take 1 on Tues and Thursday 05/05/23   Alphonsa Glendia LABOR, MD  Melatonin 1 MG TABS Take 5 mg by mouth at bedtime as needed.    [provider]  Multiple Vitamins-Minerals (MULTIVITAMIN WITH MINERALS) tablet Take 1 tablet by mouth daily.    [provider]  Omega-3 Fatty Acids (FISH OIL) 1000 MG CAPS Take 1 capsule by mouth daily.    [provider]  Red Yeast Rice Extract 300 MG CAPS Take  1 tablet by mouth daily.    [provider]  sertraline  (ZOLOFT ) 100 MG tablet Take 2 tablets (200 mg total) by mouth daily. 08/12/23   Alphonsa Glendia LABOR, MD    Allergies: Augmentin  [amoxicillin -pot clavulanate], Ciprofloxacin, Nsaids, and Rosuvastatin     Review of Systems  All other systems reviewed and are negative.   Updated Vital Signs BP (!) 176/94   Pulse 89   Temp 98.6 F (37 C) (Oral)   Resp 20   Ht 5' 8 (1.727 m)   Wt 72.6 kg   SpO2 97%   BMI 24.34 kg/m   Physical Exam  (all labs ordered are listed, but only abnormal results are displayed) Labs Reviewed  COMPREHENSIVE METABOLIC PANEL WITH GFR - Abnormal; Notable for the following components:      Result Value   Sodium 146 (*)     Potassium 2.6 (*)    Calcium  8.1 (*)    Total Protein 6.3 (*)    All other components within normal limits  CBC - Abnormal; Notable for the following components:   RBC 3.63 (*)    Hemoglobin 12.6 (*)    HCT 36.7 (*)    MCV 101.1 (*)    MCH 34.7 (*)    All other components within normal limits  ETHANOL - Abnormal; Notable for the following components:   Alcohol, Ethyl (B) 280 (*)    All other components within normal limits  URINALYSIS, ROUTINE W REFLEX MICROSCOPIC - Abnormal; Notable for the following components:   Color, Urine STRAW (*)    Specific Gravity, Urine 1.002 (*)    All other components within normal limits    EKG: EKG Interpretation Date/Time:  Tuesday November 11 2023 11:49:54 EDT Ventricular Rate:  88 PR Interval:  52 QRS Duration:  94 QT Interval:  392 QTC Calculation: 475 R Axis:   3  Text Interpretation: Sinus rhythm Short PR interval Posterior infarct, old No acute changes Confirmed by Charlyn Sora [phone]) on 11/11/2023 3:26:43 PM  Radiology: CT Head Wo Contrast Result Date: 11/11/2023 CLINICAL DATA:  Provided history: Head trauma, moderate/severe. Neck trauma. Additional history provided: Patient found down at home. EXAM: CT HEAD WITHOUT CONTRAST CT CERVICAL SPINE WITHOUT CONTRAST TECHNIQUE: Multidetector CT imaging of the head and cervical spine was performed following the standard protocol without intravenous contrast. Multiplanar CT image reconstructions of the cervical spine were also generated. RADIATION DOSE REDUCTION: This exam was performed according to the departmental dose-optimization program which includes automated exposure control, adjustment of the mA and/or kV according to patient size and/or use of iterative reconstruction technique. COMPARISON:  Report from head CT 02/11/2023 head CT 09/21/2021. FINDINGS: CT HEAD FINDINGS Brain: Generalized cerebral atrophy. Patchy and ill-defined hypoattenuation within the cerebral white matter, nonspecific  but compatible with mild chronic small vessel ischemic disease. There is no acute intracranial hemorrhage. No demarcated cortical infarct. No extra-axial fluid collection. No evidence of an intracranial mass. No midline shift. Vascular: No hyperdense vessel.  Atherosclerotic calcifications. Skull: No calvarial fracture or aggressive osseous lesion. Sinuses/Orbits: No mass or acute finding within the imaged orbits. Minimal mucosal thickening scattered within the paranasal sinuses at the imaged levels. CT CERVICAL SPINE FINDINGS Alignment: 3 mm C3-C4 grade 1 anterolisthesis. 4 mm C4-C5 grade 1 anterolisthesis. Skull base and vertebrae: The basion-dental and atlanto-dental intervals are maintained.No evidence of acute fracture to the cervical spine. Soft tissues and spinal canal: No prevertebral fluid or swelling. No visible canal hematoma. Disc levels: Cervical spondylosis with multilevel disc  space narrowing, disc bulges/central disc protrusions, uncovertebral hypertrophy and facet arthropathy. Disc space narrowing is greatest at C5-C6, C6-C7 and C7-T1 (advanced at these levels). C6-C7 posterior disc osteophyte complex. No appreciable high-grade spinal canal stenosis. Multilevel bony neural foraminal narrowing. Degenerative changes also present at the C1-C2 articulation. Upper chest: No consolidation within the imaged lung apices. No visible pneumothorax. IMPRESSION: CT head: 1.  No evidence of an acute intracranial abnormality. 2. Parenchymal atrophy and chronic small vessel ischemic disease. CT cervical spine: 1. No evidence of an acute cervical spine fracture. 2. Grade 1 anterolisthesis at C3-C4 and C4-C5. 3. Cervical spondylosis as described. Electronically Signed   By: Rockey Childs D.O.   On: 11/11/2023 12:46   CT Cervical Spine Wo Contrast Result Date: 11/11/2023 CLINICAL DATA:  Provided history: Head trauma, moderate/severe. Neck trauma. Additional history provided: Patient found down at home. EXAM: CT HEAD  WITHOUT CONTRAST CT CERVICAL SPINE WITHOUT CONTRAST TECHNIQUE: Multidetector CT imaging of the head and cervical spine was performed following the standard protocol without intravenous contrast. Multiplanar CT image reconstructions of the cervical spine were also generated. RADIATION DOSE REDUCTION: This exam was performed according to the departmental dose-optimization program which includes automated exposure control, adjustment of the mA and/or kV according to patient size and/or use of iterative reconstruction technique. COMPARISON:  Report from head CT 02/11/2023 head CT 09/21/2021. FINDINGS: CT HEAD FINDINGS Brain: Generalized cerebral atrophy. Patchy and ill-defined hypoattenuation within the cerebral white matter, nonspecific but compatible with mild chronic small vessel ischemic disease. There is no acute intracranial hemorrhage. No demarcated cortical infarct. No extra-axial fluid collection. No evidence of an intracranial mass. No midline shift. Vascular: No hyperdense vessel.  Atherosclerotic calcifications. Skull: No calvarial fracture or aggressive osseous lesion. Sinuses/Orbits: No mass or acute finding within the imaged orbits. Minimal mucosal thickening scattered within the paranasal sinuses at the imaged levels. CT CERVICAL SPINE FINDINGS Alignment: 3 mm C3-C4 grade 1 anterolisthesis. 4 mm C4-C5 grade 1 anterolisthesis. Skull base and vertebrae: The basion-dental and atlanto-dental intervals are maintained.No evidence of acute fracture to the cervical spine. Soft tissues and spinal canal: No prevertebral fluid or swelling. No visible canal hematoma. Disc levels: Cervical spondylosis with multilevel disc space narrowing, disc bulges/central disc protrusions, uncovertebral hypertrophy and facet arthropathy. Disc space narrowing is greatest at C5-C6, C6-C7 and C7-T1 (advanced at these levels). C6-C7 posterior disc osteophyte complex. No appreciable high-grade spinal canal stenosis. Multilevel bony  neural foraminal narrowing. Degenerative changes also present at the C1-C2 articulation. Upper chest: No consolidation within the imaged lung apices. No visible pneumothorax. IMPRESSION: CT head: 1.  No evidence of an acute intracranial abnormality. 2. Parenchymal atrophy and chronic small vessel ischemic disease. CT cervical spine: 1. No evidence of an acute cervical spine fracture. 2. Grade 1 anterolisthesis at C3-C4 and C4-C5. 3. Cervical spondylosis as described. Electronically Signed   By: Rockey Childs D.O.   On: 11/11/2023 12:46     Procedures   Medications Ordered in the ED  potassium chloride  SA (KLOR-CON  M) CR tablet 40 mEq (has no administration in time range)  acetaminophen  (TYLENOL ) tablet 650 mg (has no administration in time range)  ondansetron  (ZOFRAN ) tablet 4 mg (has no administration in time range)  alum & mag hydroxide-simeth (MAALOX/MYLANTA) 200-200-20 MG/5ML suspension 30 mL (has no administration in time range)  lactated ringers  bolus 1,000 mL (0 mLs Intravenous Stopped 11/11/23 1256)  magnesium  sulfate IVPB 2 g 50 mL (0 g Intravenous Stopped 11/11/23 1353)  potassium chloride  SA (KLOR-CON   M) CR tablet 40 mEq (40 mEq Oral Given 11/11/23 1256)  potassium chloride  10 mEq in 100 mL IVPB (0 mEq Intravenous Stopped 11/11/23 1459)                                    Medical Decision Making Amount and/or Complexity of Data Reviewed Labs: ordered. Radiology: ordered.  Risk OTC drugs. Prescription drug management.    78 year old patient comes in with chief complaint of altered mental status.  Patient is noted to have no focal neurodeficits on my exam.  He appears confused, but is also joking.  He has abnormal affect.  Differential diagnosis includes acute alcohol intoxication, traumatic brain injury, subdural hematoma, severe electrolyte abnormality, heat exhaustion, rhabdomyolysis.  Initial plan is to get basic labs.  Patient's vital signs are stable and within normal  limits.  His BP is slightly elevated.  Blood sugar was reassuring. CT scan of the head, C-spine ordered.  Reassessment: Adult Protective Services/social services came and assessed the patient.  I discussed with them the signs that patient had put on the door.  I reassessed the patient afterwards.  I discussed his ethanol level.  I informed him that we have replenished his potassium.  I also called patient's daughter, was on the way from [place] .  It appears that social services, spoke with the family and advised that perhaps they start looking for assisted living placement from the ER.  Family now wants patient to be placed from the ER.  I did discuss with him some of the constraints we have with placement from the ER, but that we will proceed with social work consultation.  Patient is medically cleared at this time.  It is unknown what are his home medications, pharmacy med reconciliation is pending.  Patient denies any SI, HI.  I do not think psychiatry needs to be consulted.  Final diagnoses:  Alcoholic intoxication without complication (HCC)  Acute hypokalemia    ED Discharge Orders     None          Charlyn Sora, MD 11/11/23 [phone]

## 2023-11-11 NOTE — ED Notes (Signed)
 Family stepped out of room, pt jumped out of bed and attempted to put clothes on that was covered in ants. Pt placed in paper scrubs and back into bed. Belongings moved out of room and daughter made aware.

## 2023-11-11 NOTE — ED Notes (Signed)
 Pt is asking for something for anxiety. Pt and daughter requested the ativan  to help pt relax.

## 2023-11-11 NOTE — Telephone Encounter (Signed)
 Left message to return call. 10:20 am

## 2023-11-12 LAB — POTASSIUM: Potassium: 3.8 mmol/L (ref 3.5–5.1)

## 2023-11-12 NOTE — ED Notes (Signed)
 Pt asleep. Equal rise and fall of respirations noted. Will continue to monitor

## 2023-11-12 NOTE — Evaluation (Signed)
 Physical Therapy Evaluation Patient Details Name: Thomas [name] MRN: [id number] DOB: 20-Sep-1945 Today's Date: 11/12/2023  History of Present Illness  Thomas [name] is a 78 y.o. male.        HPI        78 year old male comes into the emergency room with chief complaint of altered mental status.     Patient has remote history of prostate cancer, depression.  He is brought here by EMS.  According to the EMS, patient's nephew had called the patient this morning.  There was no response.  When the nephew went to check on the patient, patient was found on his front porch, on concrete, laying flat and unresponsive.  He had ants crawling all over him.  His blood sugar was normal.  They brought him to the ER.     EMS also noted that patient had posted signs on his house and door that indicated things like  have my wife call my number,  my depression is getting worse, leave me alone and then another rather long note was also sticking on the door.  EMS also noted 2 large whiskey bottles, one half empty and the other full.     Patient admits to alcohol use.  He states that he drank all day yesterday.  Currently he has no pain and denies any headache, neck pain, weakness, numbness.      Patient's nephew is at the bedside.  He actually is husband of patient's niece.  Patient has a daughter who lives in [place] .  He thinks that she has some kind of POA.  She was getting concerned about his wellbeing and had called social services last week, and social services were made aware of this ER visit as well.   Clinical Impression  Patient ambulated in the hallway using the RW demonstrating slow labored movement and poor/fair standing balance with UE reliance of walker for balance. Patient left in bed with bed alarm activated and daughter present. Patient will benefit from continued skilled physical therapy in hospital and recommended venue below to increase strength, balance, endurance for safe ADLs and gait.          If plan is discharge home, recommend the following: Help with stairs or ramp for entrance;Assist for transportation;Assistance with cooking/housework;A lot of help with bathing/dressing/bathroom;A little help with walking and/or transfers   Can travel by private vehicle   No    Equipment Recommendations    Recommendations for Other Services       Functional Status Assessment Patient has had a recent decline in their functional status and demonstrates the ability to make significant improvements in function in a reasonable and predictable amount of time.     Precautions / Restrictions Precautions Precautions: Fall Recall of Precautions/Restrictions: Intact Restrictions Weight Bearing Restrictions Per Provider Order: No      Mobility  Bed Mobility Overal bed mobility: Modified Independent             General bed mobility comments: increased time from EOB to supine    Transfers Overall transfer level: Needs assistance Equipment used: Rolling walker (2 wheels) Transfers: Sit to/from Stand Sit to Stand: Min assist           General transfer comment:  (min assist)    Ambulation/Gait Ambulation/Gait assistance: Min assist Gait Distance (Feet): 40 Feet Assistive device: Rolling walker (2 wheels) Gait Pattern/deviations: Decreased step length - right, Decreased step length - left Gait velocity: Slow     General Gait  Details:  (slow labored movement, unsteady)  Stairs            Wheelchair Mobility     Tilt Bed    Modified Rankin (Stroke Patients Only)       Balance Overall balance assessment: Needs assistance Sitting-balance support: Feet supported Sitting balance-Leahy Scale: Good Sitting balance - Comments: good balance sitting EOB   Standing balance support: Reliant on assistive device for balance, Bilateral upper extremity supported, During functional activity Standing balance-Leahy Scale: Poor Standing balance comment: poor/fair  balance using RW                             Pertinent Vitals/Pain      Home Living Family/patient expects to be discharged to:: Private residence Living Arrangements: Alone Available Help at Discharge: Family;Available PRN/intermittently Type of Home: Mobile home Home Access: Stairs to enter Entrance Stairs-Rails: Can reach both Entrance Stairs-Number of Steps: 3   Home Layout: One level Home Equipment: Agricultural consultant (2 wheels);Cane - quad;Cane - single point;Grab bars - tub/shower      Prior Function Prior Level of Function : Independent/Modified Independent             Mobility Comments: ambulating with cane ADLs Comments: independent most adls     Extremity/Trunk Assessment        Lower Extremity Assessment Lower Extremity Assessment: Generalized weakness    Cervical / Trunk Assessment Cervical / Trunk Assessment: Normal  Communication   Communication Communication: No apparent difficulties    Cognition Arousal: Alert Behavior During Therapy: WFL for tasks assessed/performed   PT - Cognitive impairments: No apparent impairments (daughter present to fill in gaps)                         Following commands: Intact       Cueing Cueing Techniques: Verbal cues     General Comments      Exercises     Assessment/Plan    PT Assessment Patient needs continued PT services  PT Problem List Decreased strength;Decreased activity tolerance;Decreased mobility;Decreased balance;Decreased safety awareness       PT Treatment Interventions DME instruction;Gait training;Stair training;Functional mobility training;Therapeutic activities;Balance training;Therapeutic exercise;Patient/family education    PT Goals (Current goals can be found in the Care Plan section)  Acute Rehab PT Goals Patient Stated Goal: return home PT Goal Formulation: With patient/family Time For Goal Achievement: 11/26/23 Potential to Achieve Goals: Good     Frequency Min 2X/week     Co-evaluation               AM-PAC PT 6 Clicks Mobility  Outcome Measure Help needed turning from your back to your side while in a flat bed without using bedrails?: None Help needed moving from lying on your back to sitting on the side of a flat bed without using bedrails?: A Little Help needed moving to and from a bed to a chair (including a wheelchair)?: A Little Help needed standing up from a chair using your arms (e.g., wheelchair or bedside chair)?: A Lot Help needed to walk in hospital room?: A Little Help needed climbing 3-5 steps with a railing? : A Lot 6 Click Score: 17    End of Session   Activity Tolerance: Patient tolerated treatment well;No increased pain Patient left: in bed;with bed alarm set;with family/visitor present   PT Visit Diagnosis: Unsteadiness on feet (R26.81);Other abnormalities of gait and mobility (R26.89);Muscle  weakness (generalized) (M62.81)    Time: 9048-8988 PT Time Calculation (min) (ACUTE ONLY): 20 min   Charges:   PT Evaluation $PT Eval Moderate Complexity: 1 Mod PT Treatments $Therapeutic Activity: 8-22 mins PT General Charges $$ ACUTE PT VISIT: 1 Visit         12:18 PM, 11/12/23,  Onnie Como, SPT

## 2023-11-12 NOTE — ED Provider Notes (Signed)
 To whom it may concern; please be advised Thomas [name] will require a short term nursing home stay anticipated 30 days or less rehabilitation and strengthening.  The plan is for return home after.   Suzette Pac, MD 11/12/23 [phone]

## 2023-11-12 NOTE — Plan of Care (Signed)
  Problem: Acute Rehab PT Goals(only PT should resolve) Goal: Pt Will Go Supine/Side To Sit Outcome: Progressing Flowsheets (Taken 11/12/2023 1220) Pt will go Supine/Side to Sit: Independently Goal: Patient Will Transfer Sit To/From Stand Outcome: Progressing Flowsheets (Taken 11/12/2023 1220) Patient will transfer sit to/from stand:  with supervision  with contact guard assist Goal: Pt Will Transfer Bed To Chair/Chair To Bed Outcome: Progressing Flowsheets (Taken 11/12/2023 1220) Pt will Transfer Bed to Chair/Chair to Bed:  with supervision  with contact guard assist Goal: Pt Will Ambulate Outcome: Progressing Flowsheets (Taken 11/12/2023 1220) Pt will Ambulate:  75 feet  with contact guard assist  with rolling walker

## 2023-11-12 NOTE — [place] FL2 (Signed)
 [place]  MEDICAID FL2 LEVEL OF CARE FORM     IDENTIFICATION  Patient Name: Thomas [name] Birthdate: 03-14-1946 Sex: male Admission Date (Current Location): 11/11/2023  [facility] and [place] Number:  [employer] and Address:  [facility],  [address] [place] [address]      Provider Number: [id number]  Attending Physician Name and Address:  Suzette Pac MD  Relative Name and Phone Number:  Vernell Keel ( [phone]- daughter  //  Ozell Hering [phone]    Current Level of Care:   Recommended Level of Care: Nursing Facility Prior Approval Number:    Date Approved/Denied:   PASRR Number: Pending  Discharge Plan: SNF    Current Diagnoses: Patient Active Problem List   Diagnosis Date Noted   Fall against object 09/16/2023   Concern about memory 09/16/2023   Depression, major, single episode, moderate (HCC) 06/03/2022   Rash 01/25/2022   Myalgia due to statin 08/22/2021   Chronic bilateral low back pain without sciatica 08/22/2020   Malignant neoplasm of prostate (HCC) 06/30/2020   Hypothyroidism 03/16/2019   GERD (gastroesophageal reflux disease) 11/29/2015   Insomnia 11/29/2015   Prostate cancer (HCC) 12/30/2014   Hyperlipidemia 12/30/2014   Anemia 07/19/2013    Orientation RESPIRATION BLADDER Height & Weight     Self, Situation  Normal Continent Weight: 160 lb 0.9 oz (72.6 kg) Height:  5' 8 (172.7 cm)  BEHAVIORAL SYMPTOMS/MOOD NEUROLOGICAL BOWEL NUTRITION STATUS      Continent Diet (Regular)  AMBULATORY STATUS COMMUNICATION OF NEEDS Skin   Extensive Assist Verbally Normal                       Personal Care Assistance Level of Assistance  Bathing, Feeding, Dressing Bathing Assistance: Limited assistance Feeding assistance: Independent Dressing Assistance: Limited assistance     Functional Limitations Info  Sight, Hearing, Speech Sight Info: Adequate Hearing Info: Adequate Speech Info: Adequate     SPECIAL CARE FACTORS FREQUENCY  PT (By licensed PT), OT (By licensed OT)     PT Frequency: 5 x a week OT Frequency: 5 x a week            Contractures Contractures Info: Not present    Additional Factors Info  Code Status, Allergies, Psychotropic Code Status Info: FULL Allergies Info: Augmentin , Ciprofloxacin, Nsaids, and Rosuvastatin  Psychotropic Info: Zoloft          Current Medications (11/12/2023):  This is the current hospital active medication list Current Facility-Administered Medications  Medication Dose Route Frequency Provider Last Rate Last Admin   acetaminophen  (TYLENOL ) tablet 500 mg  500 mg Oral Q6H PRN Haviland, Julie, MD       acetaminophen  (TYLENOL ) tablet 650 mg  650 mg Oral Q4H PRN Nanavati, Ankit, MD       albuterol  (VENTOLIN  HFA) 108 (90 Base) MCG/ACT inhaler 2 puff  2 puff Inhalation Q6H PRN Dean Clarity, MD       alum & mag hydroxide-simeth (MAALOX/MYLANTA) 200-200-20 MG/5ML suspension 30 mL  30 mL Oral Q6H PRN Nanavati, Ankit, MD       budesonide -glycopyrrolate -formoterol  (BREZTRI ) 160-9-4.8 MCG/ACT inhaler 2 puff  2 puff Inhalation BID Haviland, Julie, MD   2 puff at 11/12/23 9171   hydrOXYzine  (ATARAX ) tablet 25 mg  25 mg Oral Q6H PRN Dean Clarity, MD       loperamide  (IMODIUM ) capsule 2-4 mg  2-4 mg Oral PRN Haviland, Julie, MD       LORazepam  (ATIVAN )  tablet 1 mg  1 mg Oral Q6H PRN Haviland, Julie, MD   1 mg at 11/11/23 1949   multivitamin with minerals tablet 1 tablet  1 tablet Oral Daily Haviland, Julie, MD   1 tablet at 11/11/23 1942   ondansetron  (ZOFRAN ) tablet 4 mg  4 mg Oral Q8H PRN Nanavati, Ankit, MD       sertraline  (ZOLOFT ) tablet 200 mg  200 mg Oral Daily Haviland, Julie, MD   200 mg at 11/11/23 1942   thiamine  (VITAMIN B1) tablet 100 mg  100 mg Oral Daily Haviland, Julie, MD       Current Outpatient Medications  Medication Sig Dispense Refill   acetaminophen  (TYLENOL ) 500 MG tablet Take 500 mg by mouth every 6 (six) hours as  needed for mild pain (pain score 1-3).     albuterol  (VENTOLIN  HFA) 108 (90 Base) MCG/ACT inhaler INHALE 2 PUFFS BY MOUTH EVERY 6 HOURS AS NEEDED FOR WHEEZING 9 g 2   Budeson-Glycopyrrol-Formoterol  (BREZTRI  AEROSPHERE) 160-9-4.8 MCG/ACT AERO Inhale 2 puffs into the lungs 2 (two) times daily. 32.1 g 4   fexofenadine (ALLEGRA) 180 MG tablet Take 180 mg by mouth daily.     levothyroxine  (SYNTHROID ) 50 MCG tablet Take 1 1/2 tablets on M W Fri Sat Sun and take 1 on Tues and Thursday 120 tablet 3   sertraline  (ZOLOFT ) 100 MG tablet Take 2 tablets (200 mg total) by mouth daily. 180 tablet 1   Facility-Administered Medications Ordered in Other Encounters  Medication Dose Route Frequency Provider Last Rate Last Admin   leuprolide  (6 Month) (ELIGARD ) injection 45 mg  45 mg Subcutaneous Q6 months Rao, Archana C, MD   45 mg at 01/11/22 1206   leuprolide  (6 Month) (ELIGARD ) injection 45 mg  45 mg Subcutaneous Q6 months Rao, Archana C, MD   45 mg at 07/12/22 1051     Discharge Medications: Please see after visit summary for a list of discharge medications.  Relevant Imaging Results:  Relevant Lab Results:   Additional Information SSN: [id number]  Noreen KATHEE Pinal, LCSWA

## 2023-11-12 NOTE — ED Notes (Signed)
 Pt taken to restroom via wheel chair. Pt had a BM. Pt soiled bed w/ urine. Linens changed and pt cleaned up. Pt back in bed with call bell in reach. Ice water and hot coffee provided to patient. Bed alarm on.

## 2023-11-12 NOTE — ED Notes (Signed)
 Family at bedside with patient. Pt is sitting up on side of the bed eating breakfast at this time. Pts daughter has requested to speak to Child psychotherapist. Message sent to social worker

## 2023-11-12 NOTE — ED Notes (Signed)
 Pt ambulated to restroom w/ one assist

## 2023-11-12 NOTE — ED Notes (Addendum)
 TOC spoke with patient and daughter at bedside about PT recommendations for SNF. Patient and daughter agreeable to CSW sending referrals out to [address] , [place], and [facility]. TOC to follow.  11:33 AM Addendum   Referrals were sent via HUB, will start auth once I get a bed offer.   3:32 pm Addendum  CSW discussed bed options with family , [place], [place], and [address] all declined offering a bed. Daughter Vernell agreeable to referral being sent to [facility] and [place]. Both offered a bed, Vernell accepted bed offer with [facility]. CSW called CV and confirmed bed with Debbie . [facility] was started through his insurance. Family was updated. TOC to follow.

## 2023-11-13 MED ORDER — LIDOCAINE 5 % EX PTCH
1.0000 | MEDICATED_PATCH | CUTANEOUS | Status: DC
  Administered 2023-11-13: 1 via TRANSDERMAL
  Filled 2023-11-13: qty 1

## 2023-11-13 NOTE — ED Notes (Signed)
 Pt placed in blue scrubs after taking his gown and brief off multiple times. Pt had one episode of incontinence with full bed linen changed

## 2023-11-13 NOTE — ED Notes (Addendum)
 CSW had to resend patient FL2 to PASRR. Patient insurance auth approval for SNF is still pending. TOC to follow.   10:24 AM Addendum   Patient PASRR came back : [id number] E Expires: 12/13/2023  11:47 AM Addendum:   Patient insurance called CSW regarding alcohol rehab vs. SNF rehab. CSW shared that the shara is for Short-term rehab and although the alcohol may of been a factor, alcohol is not the problem. Ellouise then shared that she will update MD and that based on patient notes working with therapy , patient seems at his normal baseline. Updated daughter, daughter said that pt cannot go home when mentioning HH. Daughter did share that she was currently at the social service office to work on ALF funding and will call ALF to see if they can accept if patient does not get approved for SNF. TOC to follow.

## 2023-11-13 NOTE — ED Notes (Signed)
 Pt assisted to RR. Brief and disposable scrubs changed. Linen and chucks changed on bed

## 2023-11-13 NOTE — ED Notes (Signed)
Pt assisted with lunch.

## 2023-11-13 NOTE — ED Notes (Signed)
 Family not listed in the chart called for information on pt. RN explained no information could be given about the pt. RN encouraged caller to call pt daughter for any information.

## 2023-11-14 DIAGNOSIS — F10120 Alcohol abuse with intoxication, uncomplicated: Secondary | ICD-10-CM | POA: Diagnosis not present

## 2023-11-14 DIAGNOSIS — F03918 Unspecified dementia, unspecified severity, with other behavioral disturbance: Secondary | ICD-10-CM | POA: Diagnosis not present

## 2023-11-14 DIAGNOSIS — F32A Depression, unspecified: Secondary | ICD-10-CM | POA: Diagnosis not present

## 2023-11-14 DIAGNOSIS — Z741 Need for assistance with personal care: Secondary | ICD-10-CM | POA: Diagnosis not present

## 2023-11-14 DIAGNOSIS — R41841 Cognitive communication deficit: Secondary | ICD-10-CM | POA: Diagnosis not present

## 2023-11-14 DIAGNOSIS — E039 Hypothyroidism, unspecified: Secondary | ICD-10-CM | POA: Diagnosis not present

## 2023-11-14 DIAGNOSIS — M4319 Spondylolisthesis, multiple sites in spine: Secondary | ICD-10-CM | POA: Diagnosis not present

## 2023-11-14 DIAGNOSIS — R278 Other lack of coordination: Secondary | ICD-10-CM | POA: Diagnosis not present

## 2023-11-14 DIAGNOSIS — R262 Difficulty in walking, not elsewhere classified: Secondary | ICD-10-CM | POA: Diagnosis not present

## 2023-11-14 DIAGNOSIS — E876 Hypokalemia: Secondary | ICD-10-CM | POA: Diagnosis not present

## 2023-11-14 DIAGNOSIS — F1020 Alcohol dependence, uncomplicated: Secondary | ICD-10-CM | POA: Diagnosis not present

## 2023-11-14 DIAGNOSIS — G9341 Metabolic encephalopathy: Secondary | ICD-10-CM | POA: Diagnosis not present

## 2023-11-14 DIAGNOSIS — R4182 Altered mental status, unspecified: Secondary | ICD-10-CM | POA: Diagnosis not present

## 2023-11-14 DIAGNOSIS — F4321 Adjustment disorder with depressed mood: Secondary | ICD-10-CM | POA: Diagnosis not present

## 2023-11-14 NOTE — Discharge Instructions (Signed)
 RESOURCE GUIDE  Chronic Pain Problems: Contact [facility]  [phone] Patients need to be referred by their primary care doctor.  Insufficient Money for Medicine: Contact [employer]:  call [phone]  No Primary Care Doctor: Call [facility]  [phone] - can help you locate a primary care doctor that  accepts your insurance, provides certain services, etc. Physician Referral Service- [phone]  Agencies that provide inexpensive medical care: [facility]  [phone] [facility]  [phone] [facility]  [phone] [facility]  [phone] [facility]  [phone] [facility]  [phone]  Medicaid-accepting [facility] Providers: [facility]- [address]  [phone], Mon-Fri 9am-7pm, Sat 9am-1pm [facility]- [address] [place], [address] [place]  [phone] [facility]- [address], [address] [place]  [phone] [facility]- [address]  [phone] Renaye Rakers- [address] [place], [address], [phone]  Only accepts [place] patients after they have their name  applied to their card  Self Pay (no insurance) in [facility]: Sickle Cell Patients - [facility]  [address] [place], [phone] [facility]gent Care- [address] [place]  [phone]       [facility] [place]- [address]       -     [facility]- see information above (Speak to [employer] if you do not have insurance)       -  [facility]- [address],  [phone]       -  [facility]- [address] [phone]       -  Dr Julio Sicks-  [address], [place], [phone]       -  [facility] - [address] [phone]       -  [facility]- [address] [phone], also [address],  [phone]       -     [facility]- [address] [place], [phone], 1st & 3rd Saturday         every month, 10am-1pm  -     [facility]   [address] [place], [place].   Phone:  [phone], Fax:  [phone]. Hours of Operation:  9 am - 6 pm, M-F.  -     [facility]   [address], [place]   Phone: [phone], Fax: [phone]. Hours of Operation:  8:30 am - 5:30 pm, M-F.    Dental Assistance If unable to pay or uninsured, contact:  [facility]. to become qualified for the adult dental clinic.  Patients with Medicaid: [facility] [phone] [address], [phone] [address], [phone]  If unable to pay, or uninsured, contact [facility] [phone] in [place], [phone] in [facility]) to become qualified for the adult dental clinic  [facility] [address] [place], [place] [address] [phone] [email or web address]  Other Proofreader Services: [facility]- [address] [place], [place], [place], [address], [phone], Ext. [phone], 2nd and 4th Thursday of the month at 6:30am.  10 clients each day by appointment, can sometimes see walk-in patients if someone does not show for an appointment.  [facility]- [address] [place], [place], [address], [phone] [facility] [address] [place], [place], [address], [phone] [facility] Health Department- [phone] [facility] Health Department- [phone] [facility] [phone]

## 2023-11-14 NOTE — ED Notes (Signed)
 CSW spoke with patient daughter yesterday afternoon about his insurance Auth being approved for SNF and EMS transport. CSW secure chatted nurse and MD this morning on update with patient being ready to DC on TOC end to [facility] . CSW messaged Debbie this morning with Auth update , awaiting on room number and number to call report.

## 2023-11-14 NOTE — ED Notes (Signed)
 Ambulated patient to restroom with hands on assistance. Patient has a slight unsteady gait but able to perform activity. Patient returned to room at this time and is in geri chair having a snack and watching tv.  Able to put eyes on patient in room.

## 2023-11-14 NOTE — ED Notes (Signed)
 Patient added to convo list to transport to [facility].

## 2023-11-14 NOTE — ED Notes (Signed)
 Spoke with daughter Jhonnie) informed her that patient will be transported to [facility] as soon as transport is available. Daughter acknowledged, Asked daughter about patients wallet and keys patient contiues to request items. Daughter informed this nurse that she has them.

## 2023-11-14 NOTE — ED Provider Notes (Signed)
 Emergency Medicine Observation Re-evaluation Note  Thomas [name] is a 78 y.o. male, seen on rounds today.  Pt initially presented to the ED for complaints of Altered Mental Status Currently, the patient is resting.  Physical Exam  BP (!) 165/60   Pulse 77   Temp 98.7 F (37.1 C) (Oral)   Resp 19   Ht 5' 8 (1.727 m)   Wt 72.6 kg   SpO2 96%   BMI 24.34 kg/m  Physical Exam General: nad   ED Course / MDM  EKG:EKG Interpretation Date/Time:  Tuesday November 11 2023 11:49:54 EDT Ventricular Rate:  88 PR Interval:  52 QRS Duration:  94 QT Interval:  392 QTC Calculation: 475 R Axis:   3  Text Interpretation: Sinus rhythm Short PR interval Posterior infarct, old No acute changes Confirmed by Charlyn Sora [phone]) on 11/11/2023 3:26:43 PM  I have reviewed the labs performed to date as well as medications administered while in observation.  Recent changes in the last 24 hours include no acute changes.  Plan  Current plan is for placement.    Elnor Jayson LABOR, DO 11/14/23 [phone]

## 2023-11-17 DIAGNOSIS — E876 Hypokalemia: Secondary | ICD-10-CM | POA: Diagnosis not present

## 2023-11-17 DIAGNOSIS — E039 Hypothyroidism, unspecified: Secondary | ICD-10-CM | POA: Diagnosis not present

## 2023-11-17 DIAGNOSIS — M4319 Spondylolisthesis, multiple sites in spine: Secondary | ICD-10-CM | POA: Diagnosis not present

## 2023-11-18 DIAGNOSIS — E039 Hypothyroidism, unspecified: Secondary | ICD-10-CM | POA: Diagnosis not present

## 2023-11-18 DIAGNOSIS — M4319 Spondylolisthesis, multiple sites in spine: Secondary | ICD-10-CM | POA: Diagnosis not present

## 2023-11-19 DIAGNOSIS — G9341 Metabolic encephalopathy: Secondary | ICD-10-CM | POA: Diagnosis not present

## 2023-11-19 DIAGNOSIS — M4319 Spondylolisthesis, multiple sites in spine: Secondary | ICD-10-CM | POA: Diagnosis not present

## 2023-11-19 DIAGNOSIS — E876 Hypokalemia: Secondary | ICD-10-CM | POA: Diagnosis not present

## 2023-11-19 DIAGNOSIS — F1020 Alcohol dependence, uncomplicated: Secondary | ICD-10-CM | POA: Diagnosis not present

## 2023-11-20 DIAGNOSIS — G9341 Metabolic encephalopathy: Secondary | ICD-10-CM | POA: Diagnosis not present

## 2023-11-20 DIAGNOSIS — E876 Hypokalemia: Secondary | ICD-10-CM | POA: Diagnosis not present

## 2023-11-20 DIAGNOSIS — M4319 Spondylolisthesis, multiple sites in spine: Secondary | ICD-10-CM | POA: Diagnosis not present

## 2023-11-20 DIAGNOSIS — F1020 Alcohol dependence, uncomplicated: Secondary | ICD-10-CM | POA: Diagnosis not present

## 2023-11-21 NOTE — Telephone Encounter (Signed)
 Noted-patient currently at rehab to my understanding

## 2023-11-24 DIAGNOSIS — M4319 Spondylolisthesis, multiple sites in spine: Secondary | ICD-10-CM | POA: Diagnosis not present

## 2023-11-24 DIAGNOSIS — F32A Depression, unspecified: Secondary | ICD-10-CM | POA: Diagnosis not present

## 2023-11-24 DIAGNOSIS — G9341 Metabolic encephalopathy: Secondary | ICD-10-CM | POA: Diagnosis not present

## 2023-11-24 DIAGNOSIS — F4321 Adjustment disorder with depressed mood: Secondary | ICD-10-CM | POA: Diagnosis not present

## 2023-11-24 DIAGNOSIS — F03918 Unspecified dementia, unspecified severity, with other behavioral disturbance: Secondary | ICD-10-CM | POA: Diagnosis not present

## 2023-11-24 DIAGNOSIS — E876 Hypokalemia: Secondary | ICD-10-CM | POA: Diagnosis not present

## 2023-11-24 DIAGNOSIS — F1020 Alcohol dependence, uncomplicated: Secondary | ICD-10-CM | POA: Diagnosis not present

## 2023-11-26 DIAGNOSIS — M4319 Spondylolisthesis, multiple sites in spine: Secondary | ICD-10-CM | POA: Diagnosis not present

## 2023-11-26 DIAGNOSIS — F1020 Alcohol dependence, uncomplicated: Secondary | ICD-10-CM | POA: Diagnosis not present

## 2023-11-26 DIAGNOSIS — G9341 Metabolic encephalopathy: Secondary | ICD-10-CM | POA: Diagnosis not present

## 2023-11-26 DIAGNOSIS — E876 Hypokalemia: Secondary | ICD-10-CM | POA: Diagnosis not present

## 2023-12-01 DIAGNOSIS — E876 Hypokalemia: Secondary | ICD-10-CM | POA: Diagnosis not present

## 2023-12-01 DIAGNOSIS — G9341 Metabolic encephalopathy: Secondary | ICD-10-CM | POA: Diagnosis not present

## 2023-12-01 DIAGNOSIS — F1020 Alcohol dependence, uncomplicated: Secondary | ICD-10-CM | POA: Diagnosis not present

## 2023-12-01 DIAGNOSIS — M4319 Spondylolisthesis, multiple sites in spine: Secondary | ICD-10-CM | POA: Diagnosis not present

## 2023-12-08 DIAGNOSIS — J449 Chronic obstructive pulmonary disease, unspecified: Secondary | ICD-10-CM | POA: Diagnosis not present

## 2023-12-08 DIAGNOSIS — F331 Major depressive disorder, recurrent, moderate: Secondary | ICD-10-CM | POA: Diagnosis not present

## 2023-12-08 DIAGNOSIS — F0150 Vascular dementia without behavioral disturbance: Secondary | ICD-10-CM | POA: Diagnosis not present

## 2023-12-08 DIAGNOSIS — E782 Mixed hyperlipidemia: Secondary | ICD-10-CM | POA: Diagnosis not present

## 2023-12-08 DIAGNOSIS — E039 Hypothyroidism, unspecified: Secondary | ICD-10-CM | POA: Diagnosis not present

## 2023-12-08 DIAGNOSIS — E876 Hypokalemia: Secondary | ICD-10-CM | POA: Diagnosis not present

## 2023-12-08 DIAGNOSIS — E559 Vitamin D deficiency, unspecified: Secondary | ICD-10-CM | POA: Diagnosis not present

## 2023-12-08 DIAGNOSIS — J309 Allergic rhinitis, unspecified: Secondary | ICD-10-CM | POA: Diagnosis not present

## 2023-12-08 DIAGNOSIS — R279 Unspecified lack of coordination: Secondary | ICD-10-CM | POA: Diagnosis not present

## 2023-12-08 DIAGNOSIS — M6281 Muscle weakness (generalized): Secondary | ICD-10-CM | POA: Diagnosis not present

## 2023-12-08 DIAGNOSIS — Z8546 Personal history of malignant neoplasm of prostate: Secondary | ICD-10-CM | POA: Diagnosis not present

## 2023-12-10 ENCOUNTER — Encounter: Payer: Self-pay | Admitting: Oncology

## 2023-12-10 DIAGNOSIS — H9202 Otalgia, left ear: Secondary | ICD-10-CM | POA: Diagnosis not present

## 2023-12-11 ENCOUNTER — Telehealth: Payer: Self-pay | Admitting: *Deleted

## 2023-12-11 DIAGNOSIS — J449 Chronic obstructive pulmonary disease, unspecified: Secondary | ICD-10-CM | POA: Diagnosis not present

## 2023-12-11 DIAGNOSIS — J309 Allergic rhinitis, unspecified: Secondary | ICD-10-CM | POA: Diagnosis not present

## 2023-12-11 DIAGNOSIS — R279 Unspecified lack of coordination: Secondary | ICD-10-CM | POA: Diagnosis not present

## 2023-12-11 DIAGNOSIS — E782 Mixed hyperlipidemia: Secondary | ICD-10-CM | POA: Diagnosis not present

## 2023-12-11 DIAGNOSIS — Z8546 Personal history of malignant neoplasm of prostate: Secondary | ICD-10-CM | POA: Diagnosis not present

## 2023-12-11 DIAGNOSIS — Z556 Problems related to health literacy: Secondary | ICD-10-CM | POA: Diagnosis not present

## 2023-12-11 DIAGNOSIS — M4310 Spondylolisthesis, site unspecified: Secondary | ICD-10-CM | POA: Diagnosis not present

## 2023-12-11 DIAGNOSIS — F0153 Vascular dementia, unspecified severity, with mood disturbance: Secondary | ICD-10-CM | POA: Diagnosis not present

## 2023-12-11 DIAGNOSIS — Z87891 Personal history of nicotine dependence: Secondary | ICD-10-CM | POA: Diagnosis not present

## 2023-12-11 DIAGNOSIS — F329 Major depressive disorder, single episode, unspecified: Secondary | ICD-10-CM | POA: Diagnosis not present

## 2023-12-11 DIAGNOSIS — E876 Hypokalemia: Secondary | ICD-10-CM | POA: Diagnosis not present

## 2023-12-11 DIAGNOSIS — G319 Degenerative disease of nervous system, unspecified: Secondary | ICD-10-CM | POA: Diagnosis not present

## 2023-12-11 DIAGNOSIS — E559 Vitamin D deficiency, unspecified: Secondary | ICD-10-CM | POA: Diagnosis not present

## 2023-12-11 DIAGNOSIS — E039 Hypothyroidism, unspecified: Secondary | ICD-10-CM | POA: Diagnosis not present

## 2023-12-11 NOTE — Progress Notes (Signed)
 Complex Care Management Care Guide Note  12/11/2023 Name: Thomas [name] MRN: [id number] DOB: 1945-05-06  Thomas [name] is a 78 y.o. year old male who is a primary care patient of [name], Glendia LABOR, MD.  Theadore MARLA Gander called by phone 12/10/23 and left a message to call back.  Follow up plan: Unsuccessful telephone outreach attempt made. Voicemail is full   Harlene Satterfield  [facility], [facility] [facility] Guide  Direct Dial: [phone]  Fax [phone]

## 2023-12-11 NOTE — Progress Notes (Signed)
 Complex Care Management Care Guide Note  12/11/2023 Name: MARKESE BLOXHAM MRN: [id number] DOB: February 05, 1946  CLEOTHA TSANG is a 78 y.o. year old male who is a primary care patient of [name], Glendia LABOR, MD. Called to speak with doctor not with per patient and call was disconnected. No further outreach attempts will be made at this time. We have been unable to contact the patient to reschedule for complex care management services.   Harlene Satterfield  [facility]  Value-Based Care Institute, [facility] Guide  Direct Dial: [phone]  Fax [phone]

## 2023-12-12 ENCOUNTER — Ambulatory Visit: Admitting: Family Medicine

## 2023-12-12 NOTE — Progress Notes (Signed)
 Patient is in skilled facility.  Harlene Satterfield  [facility]  Value-Based Care Institute, [facility] Guide  Direct Dial: [phone]  Fax [phone]

## 2023-12-15 DIAGNOSIS — H6123 Impacted cerumen, bilateral: Secondary | ICD-10-CM | POA: Diagnosis not present

## 2023-12-15 DIAGNOSIS — J449 Chronic obstructive pulmonary disease, unspecified: Secondary | ICD-10-CM | POA: Diagnosis not present

## 2023-12-15 DIAGNOSIS — H9192 Unspecified hearing loss, left ear: Secondary | ICD-10-CM | POA: Diagnosis not present

## 2023-12-15 DIAGNOSIS — F331 Major depressive disorder, recurrent, moderate: Secondary | ICD-10-CM | POA: Diagnosis not present

## 2024-01-01 DIAGNOSIS — F0150 Vascular dementia without behavioral disturbance: Secondary | ICD-10-CM | POA: Diagnosis not present

## 2024-01-01 DIAGNOSIS — F4323 Adjustment disorder with mixed anxiety and depressed mood: Secondary | ICD-10-CM | POA: Diagnosis not present

## 2024-01-01 DIAGNOSIS — F331 Major depressive disorder, recurrent, moderate: Secondary | ICD-10-CM | POA: Diagnosis not present

## 2024-01-01 DIAGNOSIS — G4700 Insomnia, unspecified: Secondary | ICD-10-CM | POA: Diagnosis not present

## 2024-01-05 DIAGNOSIS — E559 Vitamin D deficiency, unspecified: Secondary | ICD-10-CM | POA: Diagnosis not present

## 2024-01-05 DIAGNOSIS — E039 Hypothyroidism, unspecified: Secondary | ICD-10-CM | POA: Diagnosis not present

## 2024-01-05 DIAGNOSIS — J309 Allergic rhinitis, unspecified: Secondary | ICD-10-CM | POA: Diagnosis not present

## 2024-01-14 ENCOUNTER — Encounter: Payer: Self-pay | Admitting: Oncology

## 2024-01-16 DIAGNOSIS — E785 Hyperlipidemia, unspecified: Secondary | ICD-10-CM | POA: Diagnosis not present

## 2024-01-19 DIAGNOSIS — F0150 Vascular dementia without behavioral disturbance: Secondary | ICD-10-CM | POA: Diagnosis not present

## 2024-01-19 DIAGNOSIS — N390 Urinary tract infection, site not specified: Secondary | ICD-10-CM | POA: Diagnosis not present

## 2024-01-19 DIAGNOSIS — F331 Major depressive disorder, recurrent, moderate: Secondary | ICD-10-CM | POA: Diagnosis not present

## 2024-01-21 ENCOUNTER — Encounter: Payer: Self-pay | Admitting: Oncology

## 2024-01-26 DIAGNOSIS — F0150 Vascular dementia without behavioral disturbance: Secondary | ICD-10-CM | POA: Diagnosis not present

## 2024-01-26 DIAGNOSIS — N390 Urinary tract infection, site not specified: Secondary | ICD-10-CM | POA: Diagnosis not present

## 2024-01-27 ENCOUNTER — Other Ambulatory Visit: Attending: Oncology

## 2024-01-27 ENCOUNTER — Ambulatory Visit

## 2024-01-27 ENCOUNTER — Encounter: Payer: Self-pay | Admitting: Oncology

## 2024-01-27 ENCOUNTER — Inpatient Hospital Stay: Admitting: Oncology

## 2024-01-29 DIAGNOSIS — J449 Chronic obstructive pulmonary disease, unspecified: Secondary | ICD-10-CM | POA: Diagnosis not present

## 2024-01-29 DIAGNOSIS — F0283 Dementia in other diseases classified elsewhere, unspecified severity, with mood disturbance: Secondary | ICD-10-CM | POA: Diagnosis not present

## 2024-01-29 DIAGNOSIS — F02818 Dementia in other diseases classified elsewhere, unspecified severity, with other behavioral disturbance: Secondary | ICD-10-CM | POA: Diagnosis not present

## 2024-01-29 DIAGNOSIS — F0393 Unspecified dementia, unspecified severity, with mood disturbance: Secondary | ICD-10-CM | POA: Diagnosis not present

## 2024-02-02 DIAGNOSIS — R1310 Dysphagia, unspecified: Secondary | ICD-10-CM | POA: Diagnosis not present

## 2024-02-02 DIAGNOSIS — J449 Chronic obstructive pulmonary disease, unspecified: Secondary | ICD-10-CM | POA: Diagnosis not present

## 2024-02-02 DIAGNOSIS — M6281 Muscle weakness (generalized): Secondary | ICD-10-CM | POA: Diagnosis not present

## 2024-02-12 DIAGNOSIS — H6123 Impacted cerumen, bilateral: Secondary | ICD-10-CM | POA: Diagnosis not present

## 2024-02-18 DIAGNOSIS — E039 Hypothyroidism, unspecified: Secondary | ICD-10-CM | POA: Diagnosis not present

## 2024-02-19 DIAGNOSIS — F02818 Dementia in other diseases classified elsewhere, unspecified severity, with other behavioral disturbance: Secondary | ICD-10-CM | POA: Diagnosis not present

## 2024-02-19 DIAGNOSIS — G4700 Insomnia, unspecified: Secondary | ICD-10-CM | POA: Diagnosis not present

## 2024-02-19 DIAGNOSIS — F0150 Vascular dementia without behavioral disturbance: Secondary | ICD-10-CM | POA: Diagnosis not present

## 2024-02-19 DIAGNOSIS — F331 Major depressive disorder, recurrent, moderate: Secondary | ICD-10-CM | POA: Diagnosis not present

## 2024-03-01 DIAGNOSIS — E782 Mixed hyperlipidemia: Secondary | ICD-10-CM | POA: Diagnosis not present

## 2024-03-01 DIAGNOSIS — J309 Allergic rhinitis, unspecified: Secondary | ICD-10-CM | POA: Diagnosis not present

## 2024-03-01 DIAGNOSIS — E039 Hypothyroidism, unspecified: Secondary | ICD-10-CM | POA: Diagnosis not present

## 2024-03-12 ENCOUNTER — Ambulatory Visit

## 2024-03-16 DIAGNOSIS — F5104 Psychophysiologic insomnia: Secondary | ICD-10-CM | POA: Diagnosis not present

## 2024-03-16 DIAGNOSIS — F331 Major depressive disorder, recurrent, moderate: Secondary | ICD-10-CM | POA: Diagnosis not present

## 2024-03-16 DIAGNOSIS — F0150 Vascular dementia without behavioral disturbance: Secondary | ICD-10-CM | POA: Diagnosis not present

## 2024-03-22 DIAGNOSIS — M545 Low back pain, unspecified: Secondary | ICD-10-CM | POA: Diagnosis not present

## 2024-03-29 DIAGNOSIS — J449 Chronic obstructive pulmonary disease, unspecified: Secondary | ICD-10-CM | POA: Diagnosis not present

## 2024-03-29 DIAGNOSIS — F331 Major depressive disorder, recurrent, moderate: Secondary | ICD-10-CM | POA: Diagnosis not present

## 2024-03-29 DIAGNOSIS — E559 Vitamin D deficiency, unspecified: Secondary | ICD-10-CM | POA: Diagnosis not present

## 2024-04-05 ENCOUNTER — Encounter: Payer: Self-pay | Admitting: Neurology

## 2024-04-05 ENCOUNTER — Ambulatory Visit: Payer: Self-pay | Admitting: Neurology

## 2024-04-05 VITALS — BP 122/72 | HR 84 | Resp 16 | Ht 63.0 in | Wt 167.0 lb

## 2024-04-05 DIAGNOSIS — G3184 Mild cognitive impairment, so stated: Secondary | ICD-10-CM | POA: Diagnosis not present

## 2024-04-05 NOTE — Progress Notes (Signed)
 [facility]  PATIENT: Thomas [name] DOB: 05-29-1945  REQUESTING CLINICIAN: [place] Evalene PARAS, [name] HISTORY FROM: Patient and daughter  REASON FOR VISIT: Memory Loss    HISTORICAL  CHIEF COMPLAINT:  Chief Complaint  Patient presents with   Establish Care    Room 12; Daughter: Thomas [name] Daughter expressed he needed to be tested for Alzheimer's. Has a family history of it. Thomas [name] noticed in patient had symptoms about three years ago. 7/22/2 [place] at home, was found by a friend. Went to [facility] and admitted for fall. Went to rehab for three weeks. Currently: [facility] in [place], [place].     HISTORY OF PRESENT ILLNESS:  Discussed the use of AI scribe software for clinical note transcription with the patient, who gave verbal consent to proceed.  Thomas [name] is a 78 year old male who presents with cognitive decline. He is accompanied by his daughter, Thomas [name].  He has been experiencing memory problems for over a year, with symptoms potentially starting three to four years ago. He has difficulty remembering recent conversations and events, although his long-term memory was initially intact. Recently, he has begun to forget events from the past ten years, including the passing of family members. His memory issues have worsened since a fall, after which he was found with a high level of alcohol in his system.  There is a significant family history of memory problems, as his mother, sister, and brother have all been affected. His daughter has been concerned about his memory for some time and had urged him to get tested, but he was reluctant due to fears of being placed in a nursing home.  Following his fall, he moved to an assisted living facility. A recent room change at the facility was followed by a noticeable change in his memory and coherence, as observed by his daughter. He is able to shower and dress himself, but his medications are managed by the facility  staff. He no longer drives, having stopped after his fall.  He has a history of seizures, with two or three episodes in his lifetime, and sleep apnea, although he does not use a CPAP machine. He is on sertraline  for depression, although he does not acknowledge having depression himself. His daughter notes that he has expressed a lack of desire to eat or live, which prompted her to contact Adult Protective Services before his fall.  He has experienced episodes of waking up at night, described by a nurse as 'sundowners'. He sleeps for long periods and has difficulty with word finding and remembering family members' names. He has also had issues with cooking, such as forgetting food on the stove, and managing his finances, which were being handled by a friend before his fall.    TBI: No past history of TBI Stroke: no past history of stroke Seizures: History of seizure  Sleep: Yes, but does not use sleep CPAP   Mood: Yes, on Sertraline   Family history of Dementia: Mother, brother and sister (currently in nursing home)  Functional status: independent in most ADLs and IADLs Patient lives with assisted living facility. Cooking: no lives in a AL  Cleaning: no Shopping: no Bathing: no issues  Toileting: no issues Driving: no Bills: was getting behind due to forgetfulness  Medications: patient  Ever left the stove on by accident?: Yes  Forget how to use items around the house?: Yes, computer  Getting lost going to familiar places?: driving local Forgetting loved ones names?: Sometimes  Word finding difficulty? Yes  Sleep: good, feels that he is sleeping too much    OTHER MEDICAL CONDITIONS: Hypothyroidism, depression, lung disease    REVIEW OF SYSTEMS: Full 14 system review of systems performed and negative with exception of: As noted in the HPI   ALLERGIES: Allergies[1]  HOME MEDICATIONS: Outpatient Medications Prior to Visit  Medication Sig Dispense Refill   acetaminophen  (TYLENOL )  500 MG tablet Take 500 mg by mouth every 6 (six) hours as needed for mild pain (pain score 1-3).     albuterol  (VENTOLIN  HFA) 108 (90 Base) MCG/ACT inhaler INHALE 2 PUFFS BY MOUTH EVERY 6 HOURS AS NEEDED FOR WHEEZING 9 g 2   Budeson-Glycopyrrol-Formoterol  (BREZTRI  AEROSPHERE) 160-9-4.8 MCG/ACT AERO Inhale 2 puffs into the lungs 2 (two) times daily. 32.1 g 4   fexofenadine (ALLEGRA) 180 MG tablet Take 180 mg by mouth daily.     levothyroxine  (SYNTHROID ) 50 MCG tablet Take 1 1/2 tablets on M W Fri Sat Sun and take 1 on Tues and Thursday 120 tablet 3   sertraline  (ZOLOFT ) 100 MG tablet Take 2 tablets (200 mg total) by mouth daily. 180 tablet 1   Facility-Administered Medications Prior to Visit  Medication Dose Route Frequency Provider Last Rate Last Admin   leuprolide  (6 Month) (ELIGARD ) injection 45 mg  45 mg Subcutaneous Q6 months Rao, Thomas [name]   45 mg at 01/11/22 1206   leuprolide  (6 Month) (ELIGARD ) injection 45 mg  45 mg Subcutaneous Q6 months Thomas Annah BROCKS, [name]   45 mg at 07/12/22 1051    PAST MEDICAL HISTORY: Past Medical History:  Diagnosis Date   Allergy    trees and plants   Anemia    IDA   Anxiety    Asthma 1998   Diverticulitis    Elevated PSA    GERD (gastroesophageal reflux disease)    Hyperlipidemia    Hypothyroidism 03/16/2019   Prostate cancer (HCC)     PAST SURGICAL HISTORY: Past Surgical History:  Procedure Laterality Date   HERNIA REPAIR  04/22/1997   prostate cancer     TRANSURETHRAL RESECTION OF PROSTATE     VASECTOMY      FAMILY HISTORY: Family History  Problem Relation Age of Onset   Alzheimer's disease Mother    Heart disease Father    Alzheimer's disease Sister    Alzheimer's disease Brother    Hypertension Brother    Diabetes Brother     SOCIAL HISTORY: Social History   Socioeconomic History   Marital status: Divorced    Spouse name: Not on file   Number of children: 1   Years of education: 12   Highest education level: 12th grade   Occupational History   Not on file  Tobacco Use   Smoking status: Former    Current packs/day: 0.00    Types: Cigarettes    Quit date: 04/22/1982    Years since quitting: 41.9    Passive exposure: Past   Smokeless tobacco: Never  Vaping Use   Vaping status: Never Used  Substance and Sexual Activity   Alcohol use: Not Currently    Comment: Daily: Stopped drinking 11/10/2023   Drug use: Not Currently    Types: Marijuana   Sexual activity: Not Currently    Partners: Female  Other Topics Concern   Not on file  Social History Narrative   Not on file   Social Drivers of Health   Tobacco Use: Medium Risk (04/05/2024)   Patient History  Smoking Tobacco Use: Former    Smokeless Tobacco Use: Never    Passive Exposure: Past  Physicist, Medical Strain: Low Risk (07/16/2023)   Overall Financial Resource Strain (CARDIA)    Difficulty of Paying Living Expenses: Not hard at all  Food Insecurity: No Food Insecurity (07/23/2023)   Hunger Vital Sign    Worried About Running Out of Food in the Last Year: Never true    Ran Out of Food in the Last Year: Never true  Transportation Needs: No Transportation Needs (07/23/2023)   PRAPARE - Administrator, Civil Service (Medical): No    Lack of Transportation (Non-Medical): No  Physical Activity: Inactive (05/15/2023)   Exercise Vital Sign    Days of Exercise per Week: 0 days    Minutes of Exercise per Session: 0 min  Stress: No Stress Concern Present (05/15/2023)   [employer] of Occupational Health - Occupational Stress Questionnaire    Feeling of Stress : Only a little  Social Connections: Moderately Integrated (07/16/2023)   Social Connection and Isolation Panel    Frequency of Communication with Friends and Family: Three times a week    Frequency of Social Gatherings with Friends and Family: Three times a week    Attends Religious Services: More than 4 times per year    Active Member of Clubs or Organizations: Yes    Attends  Banker Meetings: Never    Marital Status: Divorced  Recent Concern: Social Connections - Moderately Isolated (05/15/2023)   Social Connection and Isolation Panel    Frequency of Communication with Friends and Family: More than three times a week    Frequency of Social Gatherings with Friends and Family: More than three times a week    Attends Religious Services: More than 4 times per year    Active Member of Clubs or Organizations: No    Attends Banker Meetings: Never    Marital Status: Divorced  Catering Manager Violence: Not At Risk (07/23/2023)   Humiliation, Afraid, Rape, and Kick questionnaire    Fear of Current or Ex-Partner: No    Emotionally Abused: No    Physically Abused: No    Sexually Abused: No  Depression (PHQ2-9): Low Risk (05/15/2023)   Depression (PHQ2-9)    PHQ-2 Score: 1  Recent Concern: Depression (PHQ2-9) - High Risk (02/21/2023)   Depression (PHQ2-9)    PHQ-2 Score: 16  Alcohol Screen: Low Risk (05/15/2023)   Alcohol Screen    Last Alcohol Screening Score (AUDIT): 0  Housing: Low Risk (07/23/2023)   Housing Stability Vital Sign    Unable to Pay for Housing in the Last Year: No    Number of Times Moved in the Last Year: 0    Homeless in the Last Year: No  Utilities: Not At Risk (07/23/2023)   AHC Utilities    Threatened with loss of utilities: No  Health Literacy: Adequate Health Literacy (05/15/2023)   B1300 Health Literacy    Frequency of need for help with medical instructions: Never    PHYSICAL EXAM  GENERAL EXAM/CONSTITUTIONAL: Vitals:  Vitals:   04/05/24 1529  BP: 122/72  Pulse: 84  Resp: 16  SpO2: 96%  Weight: 167 lb (75.8 kg)  Height: 5' 3 (1.6 m)   Body mass index is 29.58 kg/m. Wt Readings from Last 3 Encounters:  04/05/24 167 lb (75.8 kg)  11/11/23 160 lb 0.9 oz (72.6 kg)  09/16/23 160 lb (72.6 kg)   Patient is in no distress;  well developed, nourished and groomed; neck is supple  MUSCULOSKELETAL: Gait,  strength, tone, movements noted in Neurologic exam below  NEUROLOGIC: MENTAL STATUS:      No data to display            04/05/2024    3:56 PM  Montreal Cognitive Assessment   Visuospatial/ Executive (0/5) 3  Naming (0/3) 2  Attention: Read list of digits (0/2) 0  Attention: Read list of letters (0/1) 1  Attention: Serial 7 subtraction starting at 100 (0/3) 0  Language: Repeat phrase (0/2) 2  Language : Fluency (0/1) 0  Abstraction (0/2) 0  Delayed Recall (0/5) 0  Orientation (0/6) 2  Total 10    awake, alert, oriented to person Difficulty with recent memory Difficulty with attention language fluent, comprehension intact, naming intact  CRANIAL NERVE:  2nd, 3rd, 4th, 6th- visual fields full to confrontation, extraocular muscles intact, no nystagmus 5th - facial sensation symmetric 7th - facial strength symmetric 8th - hearing intact 9th - palate elevates symmetrically, uvula midline 11th - shoulder shrug symmetric 12th - tongue protrusion midline  MOTOR:  normal bulk and tone, full strength in the BUE, BLE  SENSORY:  normal and symmetric to light touch  COORDINATION:  finger-nose-finger, fine finger movements normal  GAIT/STATION:  Ambulates with a cane   DIAGNOSTIC DATA (LABS, IMAGING, TESTING) - I reviewed patient records, labs, notes, testing and imaging myself where available.  Lab Results  Component Value Date   WBC 6.4 11/11/2023   HGB 12.6 (L) 11/11/2023   HCT 36.7 (L) 11/11/2023   MCV 101.1 (H) 11/11/2023   PLT 193 11/11/2023      Component Value Date/Time   NA 146 (H) 11/11/2023 1133   NA 141 12/03/2019 0802   K 3.8 11/12/2023 0733   CL 109 11/11/2023 1133   CO2 25 11/11/2023 1133   GLUCOSE 97 11/11/2023 1133   BUN 11 11/11/2023 1133   BUN 12 12/03/2019 0802   CREATININE 0.74 11/11/2023 1133   CREATININE 1.12 10/27/2023 1002   CREATININE 0.83 03/09/2014 1048   CALCIUM  8.1 (L) 11/11/2023 1133   PROT 6.3 (L) 11/11/2023 1133   PROT  6.8 03/22/2021 1341   ALBUMIN 3.5 11/11/2023 1133   ALBUMIN 4.6 03/22/2021 1341   AST 40 11/11/2023 1133   AST 32 10/27/2023 1002   ALT 27 11/11/2023 1133   ALT 21 10/27/2023 1002   ALKPHOS 106 11/11/2023 1133   BILITOT 0.4 11/11/2023 1133   BILITOT 1.4 (H) 10/27/2023 1002   GFRNONAA >60 11/11/2023 1133   GFRNONAA >60 10/27/2023 1002   GFRAA 78 12/03/2019 0802   Lab Results  Component Value Date   CHOL 242 (H) 08/20/2021   HDL 78 08/20/2021   LDLCALC 141 (H) 08/20/2021   TRIG 133 08/20/2021   CHOLHDL 3.1 08/20/2021   No results found for: HGBA1C Lab Results  Component Value Date   VITAMINB12 555 12/03/2019   Lab Results  Component Value Date   TSH 0.295 (L) 11/11/2023    Head CT 11/11/2023 1.  No evidence of an acute intracranial abnormality. 2. Parenchymal atrophy and chronic small vessel ischemic disease    ASSESSMENT AND PLAN  78 y.o. year old male with    Cognitive impairment, likely Alzheimer disease vs Major depression  Cognitive impairment with progressive memory loss over the past 3-4 years, affecting short-term and long-term memory. Family history of Alzheimer disease. Symptoms include difficulty with daily activities such as paying bills, cooking, and managing finances.  Recent fall and subsequent move to assisted living have improved symptoms. Differential diagnosis includes Alzheimer disease, vascular dementia, pseudodementia due to depression. MOCA score of 10/30 indicates memory impairment. Discussed that Alzheimer disease is a progressive disease and current medications can only slow progression, not stop it. Genetic testing for Alzheimer-associated genes and thyroid  function tests are planned to aid in diagnosis. - Ordered blood test for Alzheimer disease - Ordered genetic testing for Alzheimer-associated genes - Ordered thyroid  function test - Encouraged participation in daily exercise classes at assisted living facility - Will discuss potential  medication options if Alzheimer disease is confirmed - Will schedule follow-up in one year    1. Mild cognitive impairment      Patient Instructions  Continue current medications Will check dementia labs including ATN profile, APO E4 TSH and B12 levels Continue current medications Continue to participate in daily physical activity We discussed ways of reducing the risk of getting dementia including keeping a good health, good diet, good exercise plan and good sleep regimen Follow-up in a year or sooner if worse   There are well-accepted and sensible ways to reduce risk for Alzheimers disease and other degenerative brain disorders .  Exercise Daily Walk A daily 20 minute walk should be part of your routine. Disease related apathy can be a significant roadblock to exercise and the only way to overcome this is to make it a daily routine and perhaps have a reward at the end (something your loved one loves to eat or drink perhaps) or a personal trainer coming to the home can also be very useful. Most importantly, the patient is much more likely to exercise if the caregiver / spouse does it with him/her. In general a structured, repetitive schedule is best.  General Health: Any diseases which effect your body will effect your brain such as a pneumonia, urinary infection, blood clot, heart attack or stroke. Keep contact with your primary care doctor for regular follow ups.  Sleep. A good nights sleep is healthy for the brain. Seven hours is recommended. If you have insomnia or poor sleep habits we can give you some instructions. If you have sleep apnea wear your mask.  Diet: Eating a heart healthy diet is also a good idea; fish and poultry instead of red meat, nuts (mostly non-peanuts), vegetables, fruits, olive oil or canola oil (instead of butter), minimal salt (use other spices to flavor foods), whole grain rice, bread, cereal and pasta and wine in moderation.Research is now showing that the  MIND diet, which is a combination of The Mediterranean diet and the DASH diet, is beneficial for cognitive processing and longevity. Information about this diet can be found in The MIND Diet, a book by Annitta Feeling, MS, RDN, and online at wildwildscience.es  Finances, Power of [address] and Advance Directives: You should consider putting legal safeguards in place with regard to financial and medical decision making. While the spouse always has power of attorney for medical and financial issues in the absence of any form, you should consider what you want in case the spouse / caregiver is no longer around or capable of making decisions.     Orders Placed This Encounter  Procedures   LUMIPULSE PTAU-217/BETA AMYLOID 42 RATIO   APOE Alzheimer's Risk   TSH   Vitamin B12   ATN PROFILE    No orders of the defined types were placed in this encounter.   Return in about 1 year (around 04/05/2025).  I personally spent a total  of 65 minutes in the care of the patient today including preparing to see the patient, getting/reviewing separately obtained history, performing a medically appropriate exam/evaluation, counseling and educating, placing orders, documenting clinical information in the EHR, and discussing, dementia, the different types, diagnostic tests and we also discussed pseudodementia.   Pastor Falling, [name] 04/05/2024, 5:23 PM  [facility] [address] [place], [place] [address] [phone]     [1]  Allergies Allergen Reactions   Augmentin  [Amoxicillin -Pot Clavulanate] Other (See Comments)    GI upset   Ciprofloxacin Other (See Comments)    Unknown    Nsaids Other (See Comments)    Stomach ulcer    Rosuvastatin  Other (See Comments)    Myalgias

## 2024-04-05 NOTE — Patient Instructions (Addendum)
 Continue current medications Will check dementia labs including ATN profile, APO E4 TSH and B12 levels Continue current medications Continue to participate in daily physical activity We discussed ways of reducing the risk of getting dementia including keeping a good health, good diet, good exercise plan and good sleep regimen Follow-up in a year or sooner if worse   There are well-accepted and sensible ways to reduce risk for Alzheimers disease and other degenerative brain disorders .  Exercise Daily Walk A daily 20 minute walk should be part of your routine. Disease related apathy can be a significant roadblock to exercise and the only way to overcome this is to make it a daily routine and perhaps have a reward at the end (something your loved one loves to eat or drink perhaps) or a personal trainer coming to the home can also be very useful. Most importantly, the patient is much more likely to exercise if the caregiver / spouse does it with him/her. In general a structured, repetitive schedule is best.  General Health: Any diseases which effect your body will effect your brain such as a pneumonia, urinary infection, blood clot, heart attack or stroke. Keep contact with your primary care doctor for regular follow ups.  Sleep. A good nights sleep is healthy for the brain. Seven hours is recommended. If you have insomnia or poor sleep habits we can give you some instructions. If you have sleep apnea wear your mask.  Diet: Eating a heart healthy diet is also a good idea; fish and poultry instead of red meat, nuts (mostly non-peanuts), vegetables, fruits, olive oil or canola oil (instead of butter), minimal salt (use other spices to flavor foods), whole grain rice, bread, cereal and pasta and wine in moderation.Research is now showing that the MIND diet, which is a combination of The Mediterranean diet and the DASH diet, is beneficial for cognitive processing and longevity. Information about this diet  can be found in The MIND Diet, a book by Annitta Feeling, MS, RDN, and online at wildwildscience.es  Finances, Power of [address] and Advance Directives: You should consider putting legal safeguards in place with regard to financial and medical decision making. While the spouse always has power of attorney for medical and financial issues in the absence of any form, you should consider what you want in case the spouse / caregiver is no longer around or capable of making decisions.

## 2024-04-21 LAB — APOE ALZHEIMER'S DISEASE RISK

## 2024-04-21 LAB — ATN PROFILE
A -- Beta-amyloid 42/40 Ratio: 0.112
Beta-amyloid 40: 191.67 pg/mL
Beta-amyloid 42: 21.43 pg/mL
N -- NfL, Plasma: 5.06 pg/mL (ref 0.00–6.04)
T -- p-tau181: 1.58 pg/mL — ABNORMAL HIGH (ref 0.00–0.97)

## 2024-04-21 LAB — VITAMIN B12: Vitamin B-12: 197 pg/mL — ABNORMAL LOW (ref 232–1245)

## 2024-04-21 LAB — TSH: TSH: 2.21 u[IU]/mL (ref 0.450–4.500)

## 2024-04-23 ENCOUNTER — Ambulatory Visit: Payer: Self-pay | Admitting: Neurology

## 2024-04-23 DIAGNOSIS — E538 Deficiency of other specified B group vitamins: Secondary | ICD-10-CM

## 2024-04-23 MED ORDER — VITAMIN B-12 1000 MCG PO TABS: 1000.0000 ug | ORAL_TABLET | Freq: Every day | ORAL | 3 refills | Status: AC
